# Patient Record
Sex: Female | Born: 1975 | Race: White | Hispanic: No | Smoking: Former smoker
Health system: Southern US, Community
[De-identification: ages and names within clinical notes are randomized; demographics above are authoritative.]

## PROBLEM LIST (undated history)

## (undated) DIAGNOSIS — F329 Major depressive disorder, single episode, unspecified: Secondary | ICD-10-CM

## (undated) DIAGNOSIS — Z9109 Other allergy status, other than to drugs and biological substances: Secondary | ICD-10-CM

## (undated) DIAGNOSIS — F429 Obsessive-compulsive disorder, unspecified: Secondary | ICD-10-CM

## (undated) DIAGNOSIS — R519 Headache, unspecified: Secondary | ICD-10-CM

## (undated) DIAGNOSIS — F419 Anxiety disorder, unspecified: Secondary | ICD-10-CM

## (undated) DIAGNOSIS — D649 Anemia, unspecified: Secondary | ICD-10-CM

## (undated) DIAGNOSIS — F909 Attention-deficit hyperactivity disorder, unspecified type: Secondary | ICD-10-CM

## (undated) DIAGNOSIS — N879 Dysplasia of cervix uteri, unspecified: Secondary | ICD-10-CM

## (undated) DIAGNOSIS — R112 Nausea with vomiting, unspecified: Secondary | ICD-10-CM

## (undated) DIAGNOSIS — Z8632 Personal history of gestational diabetes: Secondary | ICD-10-CM

## (undated) DIAGNOSIS — Z973 Presence of spectacles and contact lenses: Secondary | ICD-10-CM

## (undated) DIAGNOSIS — R7303 Prediabetes: Secondary | ICD-10-CM

## (undated) DIAGNOSIS — Z9889 Other specified postprocedural states: Secondary | ICD-10-CM

## (undated) DIAGNOSIS — F32A Depression, unspecified: Secondary | ICD-10-CM

## (undated) DIAGNOSIS — J302 Other seasonal allergic rhinitis: Secondary | ICD-10-CM

## (undated) DIAGNOSIS — R51 Headache: Secondary | ICD-10-CM

## (undated) HISTORY — DX: Personal history of gestational diabetes: Z86.32

## (undated) HISTORY — DX: Obsessive-compulsive disorder, unspecified: F42.9

## (undated) HISTORY — DX: Headache: R51

## (undated) HISTORY — PX: LEEP: SHX91

## (undated) HISTORY — DX: Depression, unspecified: F32.A

## (undated) HISTORY — DX: Headache, unspecified: R51.9

## (undated) HISTORY — DX: Major depressive disorder, single episode, unspecified: F32.9

## (undated) HISTORY — DX: Attention-deficit hyperactivity disorder, unspecified type: F90.9

## (undated) HISTORY — DX: Anxiety disorder, unspecified: F41.9

---

## 2001-10-28 HISTORY — PX: LAPAROSCOPIC CHOLECYSTECTOMY: SUR755

## 2004-10-28 HISTORY — PX: LEEP: SHX91

## 2004-10-28 HISTORY — PX: TUBAL LIGATION: SHX77

## 2014-12-27 ENCOUNTER — Ambulatory Visit (INDEPENDENT_AMBULATORY_CARE_PROVIDER_SITE_OTHER): Payer: BLUE CROSS/BLUE SHIELD | Admitting: Family Medicine

## 2014-12-27 VITALS — BP 108/60 | HR 118 | Temp 99.2°F | Resp 16 | Ht 64.0 in | Wt 204.0 lb

## 2014-12-27 DIAGNOSIS — R52 Pain, unspecified: Secondary | ICD-10-CM

## 2014-12-27 DIAGNOSIS — R509 Fever, unspecified: Secondary | ICD-10-CM | POA: Diagnosis not present

## 2014-12-27 DIAGNOSIS — J45909 Unspecified asthma, uncomplicated: Secondary | ICD-10-CM | POA: Diagnosis not present

## 2014-12-27 DIAGNOSIS — R519 Headache, unspecified: Secondary | ICD-10-CM

## 2014-12-27 DIAGNOSIS — R51 Headache: Secondary | ICD-10-CM

## 2014-12-27 DIAGNOSIS — J0100 Acute maxillary sinusitis, unspecified: Secondary | ICD-10-CM

## 2014-12-27 LAB — POCT INFLUENZA A/B
Influenza A, POC: NEGATIVE
Influenza B, POC: NEGATIVE

## 2014-12-27 LAB — POCT RAPID STREP A (OFFICE): Rapid Strep A Screen: NEGATIVE

## 2014-12-27 MED ORDER — HYDROCODONE-HOMATROPINE 5-1.5 MG/5ML PO SYRP
5.0000 mL | ORAL_SOLUTION | Freq: Three times a day (TID) | ORAL | Status: DC | PRN
Start: 2014-12-27 — End: 2015-03-23

## 2014-12-27 MED ORDER — ALBUTEROL SULFATE HFA 108 (90 BASE) MCG/ACT IN AERS: 2.0000 | INHALATION_SPRAY | Freq: Four times a day (QID) | RESPIRATORY_TRACT | Status: DC | PRN

## 2014-12-27 MED ORDER — AMOXICILLIN-POT CLAVULANATE 875-125 MG PO TABS: 1.0000 | ORAL_TABLET | Freq: Two times a day (BID) | ORAL | Status: DC

## 2014-12-27 NOTE — Patient Instructions (Signed)

## 2014-12-27 NOTE — Progress Notes (Signed)
Chief Complaint:  Chief Complaint  Patient presents with  . Illness    congestion, chills, myalgias, fever    HPI Barbara Barr is a 39 y.o. female who is here for sinus congestion, throat hurts, ears clog, hurt from head to toe since last night. She has had sick contact, both her children were sick and felt fine after 2 days. She states they were fine after 2 days. For the cough she took robitussion, multisymptom. No releif. Has taken also alkaselzter cold and flu. She feels crummy over all over. Everythime she tries to breath she feels like she is  "sucking chlorine in her nose"/  She has not been able to smoke. She feels like she has the flu and a truck . She is having facial pain that is mild to moderate.  Past Medical History  Diagnosis Date  . Sinusitis   . Gestational diabetes    Past Surgical History  Procedure Laterality Date  . Cholecystectomy     History   Social History  . Marital Status: Divorced    Spouse Name: N/A  . Number of Children: N/A  . Years of Education: N/A   Social History Main Topics  . Smoking status: Current Every Day Smoker -- 1.00 packs/day for 13 years    Types: Cigarettes  . Smokeless tobacco: Not on file  . Alcohol Use: No  . Drug Use: No  . Sexual Activity: Not on file   Other Topics Concern  . None   Social History Narrative   No family history on file. Allergies  Allergen Reactions  . Codeine Itching   Prior to Admission medications   Not on File     ROS: The patient denies , night sweats, unintentional weight loss, chest pain, palpitations, wheezing, dyspnea on exertion, nausea, vomiting, abdominal pain, dysuria, hematuria, melena, numbness, , or tingling.   All other systems have been reviewed and were otherwise negative with the exception of those mentioned in the HPI and as above.    PHYSICAL EXAM: Filed Vitals:   12/27/14 1317  BP: 108/60  Pulse: 118  Temp: 99.2 F (37.3 C)  Resp: 16   Filed Vitals:    12/27/14 1317  Height: 5\' 4"  (1.626 m)  Weight: 204 lb (92.534 kg)   Body mass index is 35 kg/(m^2).  General: Alert, no acute distress HEENT:  Normocephalic, atraumatic, oropharynx patent. EOMI, PERRLA Cardiovascular:  Regular rate and rhythm, no rubs murmurs or gallops.  No Carotid bruits, radial pulse intact. No pedal edema.  Respiratory: Clear to auscultation bilaterally.  No wheezes, rales, or rhonchi.  No cyanosis, no use of accessory musculature GI: No organomegaly, abdomen is soft and non-tender, positive bowel sounds.  No masses. Skin: No rashes. Neurologic: Facial musculature symmetric. Psychiatric: Patient is appropriate throughout our interaction. Lymphatic: No cervical lymphadenopathy Musculoskeletal: Gait intact.   LABS: Results for orders placed or performed in visit on 12/27/14  Culture, Group A Strep  Result Value Ref Range   Organism ID, Bacteria Normal Upper Respiratory Flora    Organism ID, Bacteria No Beta Hemolytic Streptococci Isolated   POCT Influenza A/B  Result Value Ref Range   Influenza A, POC Negative    Influenza B, POC Negative   POCT rapid strep A  Result Value Ref Range   Rapid Strep A Screen Negative Negative     EKG/XRAY:   Primary [facility]   ASSESSMENT/PLAN: Encounter Diagnoses  Name Primary?  .Marland Kitchen  Body aches   . Fever chills   . Facial pain   . Acute maxillary sinusitis, recurrence not specified Yes  . Asthma, chronic, unspecified asthma severity, uncomplicated    Patient given Augmentin, albuterol inhaler as needed for SOB, and Hycodan cough syrup. She was negative for strep or influenza in the office.  Gross sideeffects, risk and benefits, and alternatives of medications d/w patient. Patient is aware that all medications have potential sideeffects and we are unable to predict every sideeffect or drug-drug interaction that may occur.  Barbara Stach PHUONG, DO 12/30/2014 1:48 PM

## 2014-12-28 LAB — CULTURE, GROUP A STREP: Organism ID, Bacteria: NORMAL

## 2014-12-30 ENCOUNTER — Telehealth: Payer: Self-pay | Admitting: Family Medicine

## 2014-12-30 ENCOUNTER — Emergency Department (HOSPITAL_BASED_OUTPATIENT_CLINIC_OR_DEPARTMENT_OTHER)
Admission: EM | Admit: 2014-12-30 | Discharge: 2014-12-30 | Disposition: A | Discharge status: Home / Self Care | Payer: BLUE CROSS/BLUE SHIELD | Attending: Emergency Medicine | Admitting: Emergency Medicine

## 2014-12-30 ENCOUNTER — Emergency Department (HOSPITAL_BASED_OUTPATIENT_CLINIC_OR_DEPARTMENT_OTHER): Payer: BLUE CROSS/BLUE SHIELD

## 2014-12-30 ENCOUNTER — Encounter (HOSPITAL_BASED_OUTPATIENT_CLINIC_OR_DEPARTMENT_OTHER): Payer: Self-pay

## 2014-12-30 DIAGNOSIS — J111 Influenza due to unidentified influenza virus with other respiratory manifestations: Secondary | ICD-10-CM

## 2014-12-30 DIAGNOSIS — Z791 Long term (current) use of non-steroidal anti-inflammatories (NSAID): Secondary | ICD-10-CM | POA: Insufficient documentation

## 2014-12-30 DIAGNOSIS — Z720 Tobacco use: Secondary | ICD-10-CM | POA: Diagnosis not present

## 2014-12-30 DIAGNOSIS — R0789 Other chest pain: Secondary | ICD-10-CM | POA: Insufficient documentation

## 2014-12-30 DIAGNOSIS — Z8632 Personal history of gestational diabetes: Secondary | ICD-10-CM | POA: Diagnosis not present

## 2014-12-30 DIAGNOSIS — J069 Acute upper respiratory infection, unspecified: Secondary | ICD-10-CM | POA: Diagnosis not present

## 2014-12-30 DIAGNOSIS — R05 Cough: Secondary | ICD-10-CM | POA: Diagnosis present

## 2014-12-30 LAB — BASIC METABOLIC PANEL
ANION GAP: 5 (ref 5–15)
BUN: 8 mg/dL (ref 6–23)
CHLORIDE: 103 mmol/L (ref 96–112)
CO2: 25 mmol/L (ref 19–32)
Calcium: 8.3 mg/dL — ABNORMAL LOW (ref 8.4–10.5)
Creatinine, Ser: 0.68 mg/dL (ref 0.50–1.10)
GFR calc Af Amer: >90 mL/min (ref >90)
GFR calc non Af Amer: >90 mL/min (ref >90)
Glucose, Bld: 100 mg/dL — ABNORMAL HIGH (ref 70–99)
Potassium: 3.7 mmol/L (ref 3.5–5.1)
SODIUM: 133 mmol/L — AB (ref 135–145)

## 2014-12-30 LAB — CBC WITH DIFFERENTIAL/PLATELET
BASOS ABS: 0 10*3/uL (ref 0.0–0.1)
BASOS PCT: 0 % (ref 0–1)
Eosinophils Absolute: 0 10*3/uL (ref 0.0–0.7)
Eosinophils Relative: 0 % (ref 0–5)
HCT: 41.6 % (ref 36.0–46.0)
Hemoglobin: 13.7 g/dL (ref 12.0–15.0)
LYMPHS PCT: 17 % (ref 12–46)
Lymphs Abs: 1 10*3/uL (ref 0.7–4.0)
MCH: 28.1 pg (ref 26.0–34.0)
MCHC: 32.9 g/dL (ref 30.0–36.0)
MCV: 85.2 fL (ref 78.0–100.0)
Monocytes Absolute: 0.5 10*3/uL (ref 0.1–1.0)
Monocytes Relative: 8 % (ref 3–12)
NEUTROS ABS: 4.5 10*3/uL (ref 1.7–7.7)
NEUTROS PCT: 75 % (ref 43–77)
PLATELETS: 210 10*3/uL (ref 150–400)
RBC: 4.88 MIL/uL (ref 3.87–5.11)
RDW: 13.3 % (ref 11.5–15.5)
WBC: 6 10*3/uL (ref 4.0–10.5)

## 2014-12-30 LAB — TROPONIN I: Troponin I: <0.03 ng/mL (ref <0.031)

## 2014-12-30 MED ORDER — ACETAMINOPHEN 500 MG PO TABS
1000.0000 mg | ORAL_TABLET | Freq: Once | ORAL | Status: AC
  Administered 2014-12-30: 1000 mg via ORAL
  Filled 2014-12-30: qty 2

## 2014-12-30 MED ORDER — ALBUTEROL SULFATE (2.5 MG/3ML) 0.083% IN NEBU
5.0000 mg | INHALATION_SOLUTION | Freq: Once | RESPIRATORY_TRACT | Status: AC
  Administered 2014-12-30: 5 mg via RESPIRATORY_TRACT
  Filled 2014-12-30: qty 6

## 2014-12-30 MED ORDER — SODIUM CHLORIDE 0.9 % IV BOLUS (SEPSIS)
1000.0000 mL | Freq: Once | INTRAVENOUS | Status: AC
  Administered 2014-12-30: 1000 mL via INTRAVENOUS

## 2014-12-30 NOTE — ED Notes (Signed)
Developed cough and fever 4 days ago, seen at Urgent Care.  Negative Flu, started on Augmentin, continues to cough and temp of 102.6.

## 2014-12-30 NOTE — ED Provider Notes (Signed)
CSN: [id number]     Arrival date & time 12/30/14  [id number] History   First MD Initiated Contact with Patient 12/30/14 1159     Chief Complaint  Patient presents with  . Cough     (Consider location/radiation/quality/duration/timing/severity/associated sxs/prior Treatment) HPI Barbara Barr is a 39 year old female with no significant past medical history who presents the ER complaining of cough and fever. Patient states her symptoms began gradually 4 days ago, and she was seen at an urgent care for same. Patient states she was placed on Augmentin, given an albuterol inhaler, and given Hycodan cough syrup for symptomatically relief. Patient states she had a negative flu and negative strep test at that time. Patient reports no change in her symptoms in the past several days. Patient states she is now experiencing a nonproductive cough, with associated chest discomfort when she coughs and takes a deep breath, sore throat, headache with coughing, subjective fever of 102F at home. Patient denies respiratory distress, wheezing, nausea, vomiting, abdominal pain, blurred vision, no dizziness, weakness.  Past Medical History  Diagnosis Date  . Sinusitis   . Gestational diabetes    Past Surgical History  Procedure Laterality Date  . Cholecystectomy     No family history on file. History  Substance Use Topics  . Smoking status: Current Every Day Smoker -- 1.00 packs/day for 13 years    Types: Cigarettes  . Smokeless tobacco: Not on file  . Alcohol Use: No   OB History    No data available     Review of Systems  Constitutional: Negative for fever.  HENT: Positive for congestion and sore throat. Negative for trouble swallowing.   Eyes: Negative for visual disturbance.  Respiratory: Positive for cough and chest tightness. Negative for shortness of breath and wheezing.   Cardiovascular: Negative for chest pain.  Gastrointestinal: Negative for nausea, vomiting and abdominal pain.    Genitourinary: Negative for dysuria.  Musculoskeletal: Negative for neck pain.       Chest discomfort  Skin: Negative for rash.  Neurological: Negative for dizziness, weakness and numbness.  Psychiatric/Behavioral: Negative.       Allergies  Codeine  Home Medications   Prior to Admission medications   Medication Sig Start Date End Date Taking? Authorizing Provider  albuterol (PROVENTIL HFA;VENTOLIN HFA) 108 (90 BASE) MCG/ACT inhaler Inhale 2 puffs into the lungs every 6 (six) hours as needed for wheezing or shortness of breath. 12/27/14   Barbara P Le, DO  amoxicillin-clavulanate (AUGMENTIN) 875-125 MG per tablet Take 1 tablet by mouth 2 (two) times daily. 12/27/14   Barbara P Le, DO  HYDROcodone-homatropine (HYCODAN) 5-1.5 MG/5ML syrup Take 5 mLs by mouth every 8 (eight) hours as needed. 12/27/14   Barbara P Le, DO   BP 120/61 mmHg  Pulse 95  Temp(Src) 99.7 F (37.6 C) (Oral)  Resp 16  Ht 5\' 4"  (1.626 m)  Wt 205 lb (92.987 kg)  BMI 35.17 kg/m2  SpO2 99%  LMP 12/12/2014 (Exact Date) Physical Exam  Constitutional: She is oriented to person, place, and time. She appears well-developed and well-nourished. No distress.  HENT:  Head: Normocephalic and atraumatic.  Mouth/Throat: Oropharynx is clear and moist. No oropharyngeal exudate.  Eyes: Right eye exhibits no discharge. Left eye exhibits no discharge. No scleral icterus.  Neck: Normal range of motion.  Cardiovascular: Normal rate, regular rhythm and normal heart sounds.   No murmur heard. Pulmonary/Chest: Effort normal and breath sounds normal. No accessory muscle usage. No tachypnea. No respiratory distress.  She has no decreased breath sounds. She has no wheezes. She has no rhonchi.    Abdominal: Soft. There is no tenderness.  Musculoskeletal: Normal range of motion. She exhibits no edema or tenderness.  Neurological: She is alert and oriented to person, place, and time. No cranial nerve deficit. Coordination normal.  Skin: Skin is  warm and dry. No rash noted. She is not diaphoretic.  Psychiatric: She has a normal mood and affect.  Nursing note and vitals reviewed.   ED Course  Procedures (including critical care time) Labs Review Labs Reviewed  BASIC METABOLIC PANEL - Abnormal; Notable for the following:    Sodium 133 (*)    Glucose, Bld 100 (*)    Calcium 8.3 (*)    All other components within normal limits  TROPONIN I  CBC WITH DIFFERENTIAL/PLATELET    Imaging Review Dg Chest 2 View  12/30/2014   CLINICAL DATA:  Cough and fever for 4 days.  EXAM: CHEST  2 VIEW  COMPARISON:  None.  FINDINGS: The heart size and mediastinal contours are within normal limits. Both lungs are clear. No pneumothorax or pleural effusion is noted. The visualized skeletal structures are unremarkable.  IMPRESSION: No acute cardiopulmonary abnormality seen.   Electronically Signed   By: Lupita Raider, M.D.   On: 12/30/2014 10:47     EKG Interpretation   Date/Time:  Friday December 30 2014 12:42:24 EST Ventricular Rate:  96 PR Interval:  158 QRS Duration: 76 QT Interval:  346 QTC Calculation: 437 R Axis:   65 Text Interpretation:  Normal sinus rhythm Normal ECG No old tracing to  compare Confirmed by KNAPP  MD-J, JON ([id number]) on 12/30/2014 12:51:05 PM      MDM   Final diagnoses:  URI (upper respiratory infection)  Influenza    4 days of worsening cough and fever, pleuritic chest pain with coughing, sore throat.  Neg flu/strep at UC x3 days ago.  Patient initially tachycardic at 120 on exam, patient admittedly having very poor by mouth intake over the past several days including very little liquid intake. Patient's tachycardia resolved after IV fluids, likely tachycardia due to dehydration. Patient reporting symptoms mildly subsiding after albuterol nebulizer, Tylenol and fluids in the ER. Chest pain pleuritic in nature, tender to touch and only present when patient coughing. No concern for ACS. No concern for PE. Labs reassuring.  Chest radiographs unremarkable for acute pathology. No concern for pneumonia at this point. Patient nontoxic tachycardic, nontachypneic, afebrile, non-hypoxic, well-appearing and in no acute distress, we'll discharge patient at this time. Patient meets clinical picture of influenza despite negative rapid flu earlier in the week. Discussed return precautions with patient, recommended symptomatic therapy, recommended patient follow-up with a primary care provider and patient verbalizes understanding and agreement of this plan. I encouraged patient to call or return to ER for any worsening of symptoms or should she have any questions or concerns.  BP 120/61 mmHg  Pulse 95  Temp(Src) 99.7 F (37.6 C) (Oral)  Resp 16  Ht  (1.626 m)  Wt 205 lb (92.987 kg)  BMI 35.17 kg/m2  SpO2 99%  LMP 12/12/2014 (Exact Date)  Signed,  Barbara Mow, PA-C 12:25 AM  Patient discussed with Dr. Linwood Dibbles, MD  Barbara Fantasia, PA-C 12/31/14 0025  Barbara Dibbles, MD 01/02/15 [phone]

## 2014-12-30 NOTE — Telephone Encounter (Signed)
Pt called back again this morning. Advised to f/u here or at the ER. Pt agreeable.

## 2014-12-30 NOTE — Discharge Instructions (Signed)
Follow-up with primary care provider, refer to resource guide below. Return to the ER with any severe shortness of breath, severe chest pain, worsening of symptoms.   Influenza Influenza ("the flu") is a viral infection of the respiratory tract. It occurs more often in winter months because people spend more time in close contact with one another. Influenza can make you feel very sick. Influenza easily spreads from person to person (contagious). CAUSES  Influenza is caused by a virus that infects the respiratory tract. You can catch the virus by breathing in droplets from an infected person's cough or sneeze. You can also catch the virus by touching something that was recently contaminated with the virus and then touching your mouth, nose, or eyes. RISKS AND COMPLICATIONS You may be at risk for a more severe case of influenza if you smoke cigarettes, have diabetes, have chronic heart disease (such as heart failure) or lung disease (such as asthma), or if you have a weakened immune system. Elderly people and pregnant women are also at risk for more serious infections. The most common problem of influenza is a lung infection (pneumonia). Sometimes, this problem can require emergency medical care and may be life threatening. SIGNS AND SYMPTOMS  Symptoms typically last 4 to 10 days and may include:  Fever.  Chills.  Headache, body aches, and muscle aches.  Sore throat.  Chest discomfort and cough.  Poor appetite.  Weakness or feeling tired.  Dizziness.  Nausea or vomiting. DIAGNOSIS  Diagnosis of influenza is often made based on your history and a physical exam. A nose or throat swab test can be done to confirm the diagnosis. TREATMENT  In mild cases, influenza goes away on its own. Treatment is directed at relieving symptoms. For more severe cases, your health care provider may prescribe antiviral medicines to shorten the sickness. Antibiotic medicines are not effective because the  infection is caused by a virus, not by bacteria. HOME CARE INSTRUCTIONS  Take medicines only as directed by your health care provider.  Use a cool mist humidifier to make breathing easier.  Get plenty of rest until your temperature returns to normal. This usually takes 3 to 4 days.  Drink enough fluid to keep your urine clear or pale yellow.  Cover yourmouth and nosewhen coughing or sneezing,and wash your handswellto prevent thevirusfrom spreading.  Stay homefromwork orschool untilthe fever is gonefor at least 581full day. PREVENTION  An annual influenza vaccination (flu shot) is the best way to avoid getting influenza. An annual flu shot is now routinely recommended for all adults in the [place] SEEK MEDICAL CARE IF:  You experiencechest pain, yourcough worsens,or you producemore mucus.  Youhave nausea,vomiting, ordiarrhea.  Your fever returns or gets worse. SEEK IMMEDIATE MEDICAL CARE IF:  You havetrouble breathing, you become short of breath,or your skin ornails becomebluish.  You have severe painor stiffnessin the neck.  You develop a sudden headache, or pain in the face or ear.  You have nausea or vomiting that you cannot control. MAKE SURE YOU:   Understand these instructions.  Will watch your condition.  Will get help right away if you are not doing well or get worse. Document Released: 10/11/2000 Document Revised: 02/28/2014 Document Reviewed: 01/13/2012 [facility] Patient Information 2015 [place], [place]. This information is not intended to replace advice given to you by your health care provider. Make sure you discuss any questions you have with your health care provider.   Emergency Department Resource Guide 1) Find a Doctor and Pay Out  of Pocket Although you won't have to find out who is covered by your insurance plan, it is a good idea to ask around and get recommendations. You will then need to call the office and see if the doctor you  have chosen will accept you as a new patient and what types of options they offer for patients who are self-pay. Some doctors offer discounts or will set up payment plans for their patients who do not have insurance, but you will need to ask so you aren't surprised when you get to your appointment.  2) Contact Your Local Health Department Not all health departments have doctors that can see patients for sick visits, but many do, so it is worth a call to see if yours does. If you don't know where your local health department is, you can check in your phone book. The CDC also has a tool to help you locate your state's health department, and many state websites also have listings of all of their local health departments.  3) Find a Walk-in Clinic If your illness is not likely to be very severe or complicated, you may want to try a walk in clinic. These are popping up all over the country in pharmacies, drugstores, and shopping centers. They're usually staffed by nurse practitioners or physician assistants that have been trained to treat common illnesses and complaints. They're usually fairly quick and inexpensive. However, if you have serious medical issues or chronic medical problems, these are probably not your best option.  No Primary Care Doctor: - Call Health Connect at  [phone] - they can help you locate a primary care doctor that  accepts your insurance, provides certain services, etc. - Physician Referral Service- [phone]  Chronic Pain Problems: Organization         Address  Phone   Notes  [facility]  [phone] Patients need to be referred by their primary care doctor.   Medication Assistance: Organization         Address  Phone   Notes  [facility] [address] [place], [place] [address] [phone] --Must be a resident of [facility] -- Must have NO insurance coverage whatsoever (no Medicaid/ Medicare,  etc.) -- The pt. MUST have a primary care doctor that directs their care regularly and follows them in the community   [facility]  [phone]   [employer]  [phone]    Agencies that provide inexpensive medical care: Organization         Address  Phone   Notes  [facility]  [phone]   [facility]    [phone]   [facility] [address] [place], [place] [address] [phone]   [facility] [address], [place] [phone]   [facility]anned Parenthood    [phone]   [facility]    [phone]   [facility]  [address], [place] Phone:  [phone], Fax:  [phone] Hours of Operation:  9 am - 6 pm, M-F.  Also accepts Medicaid/Medicare and self-pay.  [facility]  [address], [place] Phone: [phone], Fax: [phone]. Hours of Operation:  8:30 am - 5:30 pm, M-F.  Also accepts Medicaid and self-pay.  [facility] [place] [address] [employer] Phone: [phone]   [facility] [address] Natasha Bence [place], [place] [phone], Ext. [phone] Mondays & Thursdays: 7-9 AM.  First 15 patients are seen on a first come, first serve basis.    Medicaid-accepting [facility] Providers:  Organization         Address  Phone   Notes  [facility] [address], [place] [phone] Also accepts self-pay patients.  [facility] [address] Laurell Josephs [place], [place]  [phone]   [facility] [address], [place] [phone]   [facility] [address] [place] [phone]   Renaye Rakers [address], [place]   [phone] Only accepts [place] patients after they have their name applied to their card.   Self-Pay (no  insurance) in [facility]:  Organization         Address  Phone   Notes  Sickle Cell Patients, [facility] [address] [place], [place] [phone]   [facility] [address] [place], [place] [phone]   [facility]  [address], [place] [phone]   [facility]/Dr. Osei-Bonsu  [address], [place] or [address], [place] [phone] Phone number for both [place] and [place] locations is the same.  [facility] [address] [place] [phone]   [facility] [address] [place] or [address] [phone]   [facility] [address], [place] [phone], phone; [phone], fax Sees patients 1st and 3rd Saturday of every month.  Must not qualify for public or private insurance (i.e. Medicaid, Medicare, [place] Health Choice, Veterans' Benefits)  Household income should be no more than 200% of the poverty level The clinic cannot treat you if you are pregnant or think you are pregnant  Sexually transmitted diseases are not treated at the clinic.    Dental Care: Organization         Address  Phone  Notes  [facility] [address] [place], [place] [phone] Accepts children up to age 29 who are enrolled in [place] or [place] Health Choice; pregnant women with a Medicaid card; and children who have applied for Medicaid or [place] Health Choice, but were declined, whose parents can pay a reduced fee at time of service.  [facility]  [address], [place] [phone] Accepts children up to age 32 who are enrolled in [place] or [place] Health Choice; pregnant women with a Medicaid card; and children who have applied for Medicaid or  Health Choice, but were  declined, whose parents can pay a reduced fee at time of service.  [facility]  [address] [place], [place] [phone] Patients are seen by appointment only. Walk-ins are not accepted. [facility] will see patients 68 years of age and older. Monday - Tuesday (8am-5pm) Most Wednesdays (8:30-5pm) $30 per visit, cash only  [facility] Access [facility]  [address], [facility] [phone] Patients are seen by appointment only. Walk-ins are not accepted. [facility] will see patients 5 years of age and older. One Wednesday Evening (Monthly: Volunteer Based).  $30 per visit, cash  only  [employer] [facility] Clinics  [phone] for adults; Children under age 25, call Graduate Pediatric Dentistry at [phone]. Children aged 40-14, please call [phone] to request a pediatric application.  Dental services are provided in all areas of dental care including fillings, crowns and bridges, complete and partial dentures, implants, gum treatment, root canals, and extractions. Preventive care is also provided. Treatment is provided to both adults and children. Patients are selected via a lottery and there is often a waiting list.   [facility] [address], [place]  [phone] [email or web address]   [facility] [address] [place], [place] [phone], Ext. [phone] Second and Fourth Thursday of each month, opens at 6:30 AM; Clinic ends at 9 AM.  Patients are seen on a first-come first-served basis, and a limited number are seen during each clinic.   [facility]  [address] Ether Griffins [place], [place] [phone]   Eligibility Requirements You must have lived in [place], [place], or [place] counties for at least the last three months.   You cannot be eligible for state or federal sponsored [place], including [employer], [place], or [employer].   You generally cannot be  eligible for healthcare insurance through your employer.    How to apply: Eligibility screenings are held every Tuesday and Wednesday afternoon from 1:00 pm until 4:00 pm. You do not need an appointment for the interview!  [facility] [address], [place], [place] [phone]   [facility]  [phone]   [facility] Health [facility]  [phone]   [facility]  [phone]    Behavioral Health Resources in the Community: Intensive Outpatient Programs Organization         Address  Phone  Notes  [facility] [address], [place], [place] [phone]   [facility] [address] [place], [place] [phone]   [facility] [address] [place], [place]  [phone]   [facility] [address],  [place], [place] [phone] or [phone]   Substance Abuse Resources Organization         Address  Phone  Notes  [facility]  [phone]   [facility]  [phone]   [facility] [place]  [phone]   Floydene Flock  [phone]   Residential & Outpatient Substance Abuse Program  [phone]   Psychological Services Organization         Address  Phone  Notes  [facility]  [phone]   [facility]  [phone]   [facility] [address], [place] [phone] or [phone]    Mobile Crisis Teams Organization         Address  Phone  Notes  [facility], Mobile Crisis Care Unit  [phone]   [facility]  [address] [place], [place] [phone]   Doristine Locks [address] [place] [phone]    Self-Help/Support Groups Organization         Address  Phone             Notes  [facility] [place] - variety of support groups  [phone]- I7437963 Call for more information    Narcotics Anonymous (NA), [facility] [address], [employer] [place]  2 meetings at this location   Statistician  Address  Phone  Notes  °[facility] [address],    °[place]  [phone]   °[facility] ° [address], [place], [place] [phone]   °[facility] [address], [place] [phone] Admissions: 8am-3pm M-F  °[facility] [address]    °[place], [place] [phone]   °[facility] [address], [place], [place] [phone]   °[facility] [address]  °[place], [place] [phone]   °[facility] - Intensive Outpatient [address], [place], [place] [phone]   °[facility] [address]  °[place], [place] [phone] or [phone]   °[facility] ([facility]) [address] [place], [place] [phone] Accepts Medicaid  °[facility] [address]  °[place] 1-800-659-3381 Substance Abuse/Addiction Treatment  ° °[place] Behavior[facility] Resources °Organization         Address  Phone  Notes  °[facility]  ([phone]) [phone]   °Julie Brannon, PhD [address] [place], [place]   ([phone]) [phone] or (336) [phone]   °[facility]   [address] °[place], [place] ([phone]) [phone]   °[facility] [address], [place], [place] ([phone]) [phone] Insurance/Medicaid/sponsorship through [facility]  °[facility] [address]                                    [place], [place] [phone]) [phone] Therapy/tele-psych/case  °[facility] [address]  ° [place], [place] ([phone]) [phone]    °Dr. Arfeen  ([phone]) [phone]   °[facility] [place]  [employer] [place] [facility] 1) [address], [place] °2) [address], [place] °3)  [address], [place] ([phone]) [phone] °(336)  [phone] ° °(336) [phone]   °[place] Child Abuse Hotline (336) 342-1394 or (336) [phone] (After Hours)    ° ° ° °

## 2014-12-30 NOTE — Telephone Encounter (Signed)
The answering service called with a message from the patient. Patient states that she is not feeling any better. States that she is having painful breathing and nose bleeds. Patient needs to RTC for this. Please advise. Also note that I have marked this a high priority message due to the fact that patient is having breathing issues and nose bleeds (severity of nose bleeds is unknown).   [phone]

## 2015-03-23 ENCOUNTER — Ambulatory Visit (INDEPENDENT_AMBULATORY_CARE_PROVIDER_SITE_OTHER): Payer: BLUE CROSS/BLUE SHIELD | Admitting: Psychiatry

## 2015-03-23 ENCOUNTER — Encounter (HOSPITAL_COMMUNITY): Payer: Self-pay

## 2015-03-23 ENCOUNTER — Encounter (HOSPITAL_COMMUNITY): Payer: Self-pay | Admitting: Psychiatry

## 2015-03-23 VITALS — BP 130/74 | HR 105 | Ht 65.0 in | Wt 198.0 lb

## 2015-03-23 DIAGNOSIS — F32A Depression, unspecified: Secondary | ICD-10-CM

## 2015-03-23 DIAGNOSIS — F329 Major depressive disorder, single episode, unspecified: Secondary | ICD-10-CM

## 2015-03-23 DIAGNOSIS — F331 Major depressive disorder, recurrent, moderate: Secondary | ICD-10-CM

## 2015-03-23 DIAGNOSIS — R5383 Other fatigue: Secondary | ICD-10-CM

## 2015-03-23 DIAGNOSIS — F909 Attention-deficit hyperactivity disorder, unspecified type: Secondary | ICD-10-CM

## 2015-03-23 MED ORDER — BUPROPION HCL 100 MG PO TABS: 200.0000 mg | ORAL_TABLET | Freq: Every day | ORAL | Status: DC
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
## 2015-04-13 ENCOUNTER — Ambulatory Visit (INDEPENDENT_AMBULATORY_CARE_PROVIDER_SITE_OTHER): Payer: BLUE CROSS/BLUE SHIELD | Admitting: Psychiatry

## 2015-04-13 ENCOUNTER — Encounter (HOSPITAL_COMMUNITY): Payer: Self-pay | Admitting: Psychiatry

## 2015-04-13 VITALS — BP 108/62 | HR 100 | Ht 65.0 in | Wt 200.0 lb

## 2015-04-13 DIAGNOSIS — F909 Attention-deficit hyperactivity disorder, unspecified type: Secondary | ICD-10-CM | POA: Diagnosis not present

## 2015-04-13 DIAGNOSIS — R5383 Other fatigue: Secondary | ICD-10-CM | POA: Diagnosis not present

## 2015-04-13 DIAGNOSIS — F32A Depression, unspecified: Secondary | ICD-10-CM

## 2015-04-13 DIAGNOSIS — F329 Major depressive disorder, single episode, unspecified: Secondary | ICD-10-CM

## 2015-04-13 MED ORDER — AMPHETAMINE-DEXTROAMPHETAMINE 10 MG PO TABS: ORAL_TABLET | ORAL | Status: DC
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
## 2015-05-10 ENCOUNTER — Telehealth (HOSPITAL_COMMUNITY): Payer: Self-pay

## 2015-05-10 MED ORDER — AMPHETAMINE-DEXTROAMPHETAMINE 10 MG PO TABS: ORAL_TABLET | ORAL | Status: DC

## 2015-05-10 NOTE — Telephone Encounter (Signed)
PT needs a script for Adderall / Will p/u after 1pm 05/11/15

## 2015-05-10 NOTE — Telephone Encounter (Signed)
adderall printed for pick up 

## 2015-05-12 ENCOUNTER — Ambulatory Visit (HOSPITAL_COMMUNITY): Payer: Self-pay | Admitting: Psychiatry

## 2015-06-05 ENCOUNTER — Encounter (HOSPITAL_BASED_OUTPATIENT_CLINIC_OR_DEPARTMENT_OTHER): Payer: Self-pay

## 2015-06-05 ENCOUNTER — Emergency Department (HOSPITAL_BASED_OUTPATIENT_CLINIC_OR_DEPARTMENT_OTHER)
Admission: EM | Admit: 2015-06-05 | Discharge: 2015-06-05 | Disposition: A | Discharge status: Home / Self Care | Payer: BLUE CROSS/BLUE SHIELD | Attending: Emergency Medicine | Admitting: Emergency Medicine

## 2015-06-05 ENCOUNTER — Emergency Department (HOSPITAL_BASED_OUTPATIENT_CLINIC_OR_DEPARTMENT_OTHER): Payer: BLUE CROSS/BLUE SHIELD

## 2015-06-05 DIAGNOSIS — Z8709 Personal history of other diseases of the respiratory system: Secondary | ICD-10-CM | POA: Insufficient documentation

## 2015-06-05 DIAGNOSIS — Z720 Tobacco use: Secondary | ICD-10-CM | POA: Diagnosis not present

## 2015-06-05 DIAGNOSIS — Z8632 Personal history of gestational diabetes: Secondary | ICD-10-CM | POA: Insufficient documentation

## 2015-06-05 DIAGNOSIS — Y998 Other external cause status: Secondary | ICD-10-CM | POA: Diagnosis not present

## 2015-06-05 DIAGNOSIS — S3992XA Unspecified injury of lower back, initial encounter: Secondary | ICD-10-CM | POA: Diagnosis not present

## 2015-06-05 DIAGNOSIS — M545 Low back pain, unspecified: Secondary | ICD-10-CM

## 2015-06-05 DIAGNOSIS — Z3202 Encounter for pregnancy test, result negative: Secondary | ICD-10-CM | POA: Insufficient documentation

## 2015-06-05 DIAGNOSIS — F909 Attention-deficit hyperactivity disorder, unspecified type: Secondary | ICD-10-CM | POA: Insufficient documentation

## 2015-06-05 DIAGNOSIS — Y9289 Other specified places as the place of occurrence of the external cause: Secondary | ICD-10-CM | POA: Diagnosis not present

## 2015-06-05 DIAGNOSIS — Y9389 Activity, other specified: Secondary | ICD-10-CM | POA: Insufficient documentation

## 2015-06-05 DIAGNOSIS — W109XXA Fall (on) (from) unspecified stairs and steps, initial encounter: Secondary | ICD-10-CM | POA: Insufficient documentation

## 2015-06-05 LAB — PREGNANCY, URINE: PREG TEST UR: NEGATIVE

## 2015-06-05 MED ORDER — METHOCARBAMOL 500 MG PO TABS: 500.0000 mg | ORAL_TABLET | Freq: Two times a day (BID) | ORAL | Status: DC

## 2015-06-05 MED ORDER — NAPROXEN 500 MG PO TABS: 500.0000 mg | ORAL_TABLET | Freq: Two times a day (BID) | ORAL | Status: DC

## 2015-06-05 NOTE — Discharge Instructions (Signed)
Back Exercises Return for any bowel or bladder incontinence or lower extremity weakness.  Back exercises help treat and prevent back injuries. The goal of back exercises is to increase the strength of your abdominal and back muscles and the flexibility of your back. These exercises should be started when you no longer have back pain. Back exercises include:  Pelvic Tilt. Lie on your back with your knees bent. Tilt your pelvis until the lower part of your back is against the floor. Hold this position 5 to 10 sec and repeat 5 to 10 times.  Knee to Chest. Pull first 1 knee up against your chest and hold for 20 to 30 seconds, repeat this with the other knee, and then both knees. This may be done with the other leg straight or bent, whichever feels better.  Sit-Ups or Curl-Ups. Bend your knees 90 degrees. Start with tilting your pelvis, and do a partial, slow sit-up, lifting your trunk only 30 to 45 degrees off the floor. Take at least 2 to 3 seconds for each sit-up. Do not do sit-ups with your knees out straight. If partial sit-ups are difficult, simply do the above but with only tightening your abdominal muscles and holding it as directed.  Hip-Lift. Lie on your back with your knees flexed 90 degrees. Push down with your feet and shoulders as you raise your hips a couple inches off the floor; hold for 10 seconds, repeat 5 to 10 times.  Back arches. Lie on your stomach, propping yourself up on bent elbows. Slowly press on your hands, causing an arch in your low back. Repeat 3 to 5 times. Any initial stiffness and discomfort should lessen with repetition over time.  Shoulder-Lifts. Lie face down with arms beside your body. Keep hips and torso pressed to floor as you slowly lift your head and shoulders off the floor. Do not overdo your exercises, especially in the beginning. Exercises may cause you some mild back discomfort which lasts for a few minutes; however, if the pain is more severe, or lasts for  more than 15 minutes, do not continue exercises until you see your caregiver. Improvement with exercise therapy for back problems is slow.  See your caregivers for assistance with developing a proper back exercise program. Document Released: 11/21/2004 Document Revised: 01/06/2012 Document Reviewed: 08/15/2011 [facility] Patient Information 2015 [place], [place]. This information is not intended to replace advice given to you by your health care provider. Make sure you discuss any questions you have with your health care provider.  Emergency Department Resource Guide 1) Find a Doctor and Pay Out of Pocket Although you won't have to find out who is covered by your insurance plan, it is a good idea to ask around and get recommendations. You will then need to call the office and see if the doctor you have chosen will accept you as a new patient and what types of options they offer for patients who are self-pay. Some doctors offer discounts or will set up payment plans for their patients who do not have insurance, but you will need to ask so you aren't surprised when you get to your appointment.  2) Contact Your Local Health Department Not all health departments have doctors that can see patients for sick visits, but many do, so it is worth a call to see if yours does. If you don't know where your local health department is, you can check in your phone book. The CDC also has a tool to help you  locate your state's health department, and many state websites also have listings of all of their local health departments.  3) Find a Walk-in Clinic If your illness is not likely to be very severe or complicated, you may want to try a walk in clinic. These are popping up all over the country in pharmacies, drugstores, and shopping centers. They're usually staffed by nurse practitioners or physician assistants that have been trained to treat common illnesses and complaints. They're usually fairly quick and inexpensive.  However, if you have serious medical issues or chronic medical problems, these are probably not your best option.  No Primary Care Doctor: - Call Health Connect at  [phone] - they can help you locate a primary care doctor that  accepts your insurance, provides certain services, etc. - Physician Referral Service- [phone]  Chronic Pain Problems: Organization         Address  Phone   Notes  [facility]  [phone] Patients need to be referred by their primary care doctor.   Medication Assistance: Organization         Address  Phone   Notes  [facility] Medication [facility] [address] [place], [place] [address] [phone] --Must be a resident of [facility] -- Must have NO insurance coverage whatsoever (no Medicaid/ Medicare, etc.) -- The pt. MUST have a primary care doctor that directs their care regularly and follows them in the community   [facility]  [phone]   [employer]  [phone]    Agencies that provide inexpensive medical care: Organization         Address  Phone   Notes  [facility]  [phone]   [facility]    [phone]   [facility] [address] [place], [place] [address] [phone]   [facility] [address], [place] [phone]   [facility]    [phone]   [facility]    [phone]   [facility]  [address], [place] Phone:  [phone], Fax:  [phone] Hours of Operation:  9 am - 6 pm, M-F.  Also accepts Medicaid/Medicare and self-pay.  [facility]  [address], [place] Phone: [phone], Fax: [phone]. Hours of Operation:  8:30 am - 5:30 pm, M-F.  Also accepts Medicaid and self-pay.  [facility] [place] [address], [place] Phone: [phone]   [facility] [address] Natasha Bence [place], [place] [phone], Ext. [phone] Mondays & Thursdays: 7-9 AM.  First 15 patients are seen on a first come, first serve basis.    Medicaid-accepting [facility] Providers:  Organization         Address  Phone   Notes  [facility] [address], [place] [phone] Also accepts self-pay patients.  [facility] [address] Laurell Josephs [place], [place]  [phone]   [facility] [address], [place] [phone]   [facility] [address] [place] [phone]   Renaye Rakers [address], [place]   [phone] Only accepts [place] patients after they have their name applied to their card.   Self-Pay (no insurance) in [facility]:  Organization  Address  Phone   Notes  °Sickle Cell Patients, [facility] [address], [place] [phone]   °[facility] [address], [place] ([phone]) [phone]   °[facility] ° [address], [place] [phone]) [phone]   °[facility]/Dr. Osei-Bonsu ° [address] [place] [address], [place] or [address], [place] ([phone]) [phone] Phone number for both [place] and [place] locations is the same.  °[facility] [address], [place] ([phone]) [phone]   °[facility] [address] [place] [address], [place] or [address] (336) [phone] °(336) [phone]   °[facility] [address], [place] ([phone]) [phone], phone; [phone], fax Sees patients 1st and 3rd Saturday of every month.  Must not qualify for public or private insurance (i.e. Medicaid, Medicare, [place] Health Choice, Veterans' Benefits) • Household income should be no more than 200% of the poverty level •The clinic cannot treat you if you are pregnant or think  you are pregnant • Sexually transmitted diseases are not treated at the clinic.  ° ° °Dental Care: °Organization         Address  Phone  Notes  °[facility] [address], [place] ([phone]) [phone] Accepts children up to age 21 who are enrolled in Medicaid or [place] Health Choice; pregnant women with a Medicaid card; and children who have applied for Medicaid or [place] Health Choice, but were declined, whose parents can pay a reduced fee at time of service.  °[facility] [place]  [address], [place] (336) [phone] Accepts children up to age 21 who are enrolled in Medicaid or [place] Health Choice; pregnant women with a Medicaid card; and children who have applied for Medicaid or [place] Health Choice, but were declined, whose parents can pay a reduced fee at time of service.  °[facility] ° [address], [place] ([phone]) [phone] Patients are seen by appointment only. Walk-ins are not accepted. [facility] will see patients 18 years of age and older. °Monday - Tuesday (8am-5pm) °Most Wednesdays (8:30-5pm) °$30 per visit, cash only  °[facility] ° [address], [place] ([phone]) [phone] Patients are seen by appointment only. Walk-ins are not accepted. [facility] will see patients 18 years of age and older. °One Wednesday Evening (Monthly: Volunteer Based).  $30 per visit, cash only  °[facility]  ([phone]) [phone] for adults; Children under age 4, call Graduate Pediatric Dentistry at [phone]. Children aged 4-14, please call [phone] to request a pediatric application. ° Dental services are provided in all areas of dental care including fillings, crowns and bridges, complete and partial dentures, implants, gum treatment, root canals, and extractions. Preventive care is also provided. Treatment is provided to both adults and  children. °Patients are selected via a lottery and there is often a waiting list. °  °[facility] [address], °[place] ° (336) 763-8833 [email or web address] °  °[facility] [address], [place], [place] (336)723-1848, Ext. 123 Second and Fourth Thursday of each month, opens at 6:30 AM; Clinic ends at 9 AM.  Patients are seen on a first-come first-served basis, and a limited number are seen during each clinic.  ° °[facility] ° [address], [place], [place] ([phone]) [phone]   Eligibility Requirements °You must have lived in [place], [place], or [place] counties for at least the last three months. °  You cannot be eligible for state or federal sponsored healthcare insurance, including Veterans Administration, Medicaid, or Medicare. °  You generally cannot be eligible for healthcare insurance through your employer.  °  How to apply: °Eligibility screenings are held every Tuesday and Wednesday afternoon from 1:00 pm until 4:00 pm. You do not need an appointment for the interview!  °[facility] [address], [place], [place] [phone]   °[facility]  336-342-8273   °[facility]  336-703-3100   °[facility]  336-570-6415   ° °Behavioral Health Resources in the Community: °Intensive Outpatient Programs °Organization         Address  Phone  Notes  °[facility] [address], [place], [place] 336-878-6098   °[facility] [address], [place], [place] 336-832-9800   °[facility] [address], [place], [place] ° 336-882-2125   °[facility] [address],  °[place], [place] 1-800-853-5163 or 336-641-4981   °Substance Abuse Resources °Organization         Address  Phone  Notes  °[facility]  336-882-2125   °[facility]  336-784-9470   °[facility]  336-285-9073     °[facility]  336-845-3988   °Residential & Outpatient Substance Abuse Program  1-800-659-3381   °Psychological Services °Organization         Address  Phone  Notes  °[facility]  336- 832-9600   °[facility]  336- 378-7881   °[facility] [address], [place] 1-800-853-5163 or 336-641-4981   ° °Mobile Crisis Teams °Organization         Address  Phone  Notes  °[facility], Mobile Crisis Care Unit  1-877-626-1772   °Assertive °[facility] ° [address] [place], [place] 336-834-9664   °Sharon DeEsch [address] °[place] 336-554-5454   ° °Self-Help/Support Groups °Organization         Address  Phone             Notes  °[facility] - variety of support groups  336- 373-1402 Call for more information  °Narcotics Anonymous (NA), [facility] [address], °[place]  2 meetings at this location  ° °Residential Treatment Programs °Organization         Address  Phone  Notes  °[facility] [address],    °[place]  1-866-801-8205   °[facility] ° [address], [place], [place] 704-293-8524   °[facility] [address], [place] 336-845-3988 Admissions: 8am-3pm M-F  °[facility] [address]    °[place], [place] 336-841-1104   °[facility] [address], [place], [place] 336-379-7146   °[facility] [address]  °[place], [place] 336-285-9073   °[facility] - Intensive Outpatient [address], [place], [place] 336-852-3033   °[facility] [address]  °[place], [place] 1-877-615-2722 or 336-784-9470   °[facility] ([facility]) [address] [place], [place] 336-227-7417 Accepts Medicaid  °[facility] [address]  °[place] 1-800-659-3381 Substance Abuse/Addiction Treatment  ° °[place]  Behavioral Health Resources °  Organization         Address  Phone  Notes  °[facility]  (888) 581-9988   °Julie Brannon, PhD [address] [place], [place]   (336) 349-5553 or (336) 951-0000   °[facility]   [address] °[place], [place] (336) 349-4454   °[facility] [address], [place], [place] (336) 342-8316 Insurance/Medicaid/sponsorship through [facility]  °[facility] [address]                                    [place], [place] (336) 342-8316 Therapy/tele-psych/case  °[facility] [address]  ° [place], [place] (336) 349-2233    °Dr. Arfeen  (336) 349-4544   °[facility] [place]  [employer] [facility] [facility] 1) [address],  °2) [address], [place] °3)  [address] [place] [address], [place] (336) 349-3220 °(336) 342-7768 ° °(336) 342-8140   °[place] [facility] (336) 342-1394 or (336) 342-3537 (After Hours)    ° ° ° °

## 2015-06-05 NOTE — ED Notes (Signed)
Slipped on steps yesterday-pain to lower back, left buttock pain with decreased "feeling to left leg"-steady gait to triage

## 2015-06-05 NOTE — ED Notes (Signed)
Patient transported to X-ray 

## 2015-06-05 NOTE — ED Provider Notes (Signed)
CSN: [id number]     Arrival date & time 06/05/15  1655 History   First MD Initiated Contact with Patient 06/05/15 1814     Chief Complaint  Patient presents with  . Fall     (Consider location/radiation/quality/duration/timing/severity/associated sxs/prior Treatment) Patient is a 39 y.o. female presenting with fall. The history is provided by the patient. No language interpreter was used.  Fall Associated symptoms include numbness. Pertinent negatives include no weakness.   Barbara Barr is a 39 year old female who presents after slip and fall while going down the stairs yesterday. She states she was able to get up immediately and walk. She denies any head injury or loss of consciousness. She states that she woke up this morning and the pain was worse. She is complaining of left-sided back pain that radiates down her left leg with intermittent numbness. She denies any bowel or bladder incontinence or retention. She denies any weakness in her lower extremities.   Past Medical History  Diagnosis Date  . Sinusitis   . Gestational diabetes   . ADHD (attention deficit hyperactivity disorder)   . Anxiety   . Depression   . Fatigue   . Headache   . Obsessive-compulsive disorder    Past Surgical History  Procedure Laterality Date  . Cholecystectomy  2003  . Tubal ligation  2006   Family History  Problem Relation Age of Onset  . Dementia Maternal Aunt   . Seizures Maternal Aunt   . ADD / ADHD Maternal Uncle   . Drug abuse Maternal Uncle    History  Substance Use Topics  . Smoking status: Current Every Day Smoker -- 1.00 packs/day for 13 years    Types: Cigarettes  . Smokeless tobacco: Not on file  . Alcohol Use: No   OB History    No data available     Review of Systems  Musculoskeletal: Positive for back pain. Negative for gait problem.  Skin: Negative for wound.  Neurological: Positive for numbness. Negative for weakness.  All other systems reviewed and are  negative.     Allergies  Lexapro and Codeine  Home Medications   Prior to Admission medications   Medication Sig Start Date End Date Taking? Authorizing Provider  amphetamine-dextroamphetamine (ADDERALL) 10 MG tablet Generic OK. 05/10/15   Thresa Ross, MD  methocarbamol (ROBAXIN) 500 MG tablet Take 1 tablet (500 mg total) by mouth 2 (two) times daily. 06/05/15   Aisa Schoeppner Patel-Mills, PA-C  naproxen (NAPROSYN) 500 MG tablet Take 1 tablet (500 mg total) by mouth 2 (two) times daily. 06/05/15   Jatavius Ellenwood Patel-Mills, PA-C   BP 126/74 mmHg  Pulse 91  Temp(Src) 98.3 F (36.8 C) (Oral)  Resp 18  Ht  (1.651 m)  Wt 189 lb (85.73 kg)  BMI 31.45 kg/m2  SpO2 100%  LMP 04/23/2015 Physical Exam  Constitutional: She is oriented to person, place, and time. She appears well-developed.  HENT:  Head: Normocephalic and atraumatic.  Eyes: Conjunctivae are normal.  Neck: Normal range of motion. Neck supple.  Cardiovascular: Normal rate.   Pulmonary/Chest: Effort normal. No respiratory distress.  Abdominal: Soft. She exhibits no distension. There is no tenderness.  Musculoskeletal: Normal range of motion.  No midline lumbar vertebral tenderness to palpation. She does have left-sided or vertebral lumbar tenderness to palpation. No ecchymosis, lacerations, abrasions noted on the back. Negative straight leg raise bilaterally. Able to dorsi and plantar flex without difficulty. 2+ DP pulses bilateral extremities.  Neurological: She is alert and oriented to  person, place, and time. She has normal strength. No sensory deficit.  Ambulatory with steady gait.  Skin: Skin is warm and dry.  Psychiatric: She has a normal mood and affect. Her behavior is normal.  Nursing note and vitals reviewed.   ED Course  Procedures (including critical care time) Labs Review Labs Reviewed  PREGNANCY, URINE    Imaging Review Dg Lumbar Spine Complete  06/05/2015   CLINICAL DATA:  39 year old female with lumbar spine  pain after falling down a flight of stairs yesterday. Positive radiculopathy on the left.  EXAM: LUMBAR SPINE - COMPLETE 4+ VIEW  COMPARISON:  None.  FINDINGS: There is no evidence of lumbar spine fracture. Alignment is normal. Intervertebral disc spaces are maintained. Tubal ligation clips project over the anatomic pelvis. Surgical clips in a right upper quadrant suggest prior cholecystectomy.  IMPRESSION: Negative.   Electronically Signed   By: Malachy Moan M.D.   On: 06/05/2015 19:19     EKG Interpretation None      MDM   Final diagnoses:  Left-sided low back pain without sciatica  Patient presents for left-sided back pain after fall. Vitals stable. X-ray of lumbar spine is negative for acute fracture. I believe this is musculoskeletal related. She is neurovascularly intact and has no concerning signs for cauda equina syndrome. She was given Robaxin and naproxen. I gave the patient return precautions. I discussed follow-up and patient verbally agrees with the plan.     Catha Gosselin, PA-C 06/05/15 2118  Richardean Canal, MD 06/05/15 [phone]

## 2015-06-05 NOTE — ED Notes (Signed)
Pa  at bedside. 

## 2015-06-10 ENCOUNTER — Emergency Department (HOSPITAL_COMMUNITY): Payer: BLUE CROSS/BLUE SHIELD

## 2015-06-10 ENCOUNTER — Emergency Department (HOSPITAL_COMMUNITY)
Admission: EM | Admit: 2015-06-10 | Discharge: 2015-06-10 | Disposition: A | Discharge status: Home / Self Care | Payer: BLUE CROSS/BLUE SHIELD | Attending: Emergency Medicine | Admitting: Emergency Medicine

## 2015-06-10 ENCOUNTER — Encounter (HOSPITAL_COMMUNITY): Payer: Self-pay

## 2015-06-10 DIAGNOSIS — S4992XA Unspecified injury of left shoulder and upper arm, initial encounter: Secondary | ICD-10-CM | POA: Insufficient documentation

## 2015-06-10 DIAGNOSIS — Y92410 Unspecified street and highway as the place of occurrence of the external cause: Secondary | ICD-10-CM | POA: Insufficient documentation

## 2015-06-10 DIAGNOSIS — F909 Attention-deficit hyperactivity disorder, unspecified type: Secondary | ICD-10-CM | POA: Diagnosis not present

## 2015-06-10 DIAGNOSIS — Z791 Long term (current) use of non-steroidal anti-inflammatories (NSAID): Secondary | ICD-10-CM | POA: Diagnosis not present

## 2015-06-10 DIAGNOSIS — Z8709 Personal history of other diseases of the respiratory system: Secondary | ICD-10-CM | POA: Diagnosis not present

## 2015-06-10 DIAGNOSIS — S199XXA Unspecified injury of neck, initial encounter: Secondary | ICD-10-CM | POA: Diagnosis not present

## 2015-06-10 DIAGNOSIS — R000 Tachycardia, unspecified: Secondary | ICD-10-CM | POA: Diagnosis not present

## 2015-06-10 DIAGNOSIS — Y9389 Activity, other specified: Secondary | ICD-10-CM | POA: Diagnosis not present

## 2015-06-10 DIAGNOSIS — F419 Anxiety disorder, unspecified: Secondary | ICD-10-CM | POA: Insufficient documentation

## 2015-06-10 DIAGNOSIS — R110 Nausea: Secondary | ICD-10-CM | POA: Insufficient documentation

## 2015-06-10 DIAGNOSIS — Y999 Unspecified external cause status: Secondary | ICD-10-CM | POA: Insufficient documentation

## 2015-06-10 DIAGNOSIS — Z8632 Personal history of gestational diabetes: Secondary | ICD-10-CM | POA: Diagnosis not present

## 2015-06-10 DIAGNOSIS — Z720 Tobacco use: Secondary | ICD-10-CM | POA: Diagnosis not present

## 2015-06-10 DIAGNOSIS — Z79899 Other long term (current) drug therapy: Secondary | ICD-10-CM | POA: Insufficient documentation

## 2015-06-10 DIAGNOSIS — S3992XA Unspecified injury of lower back, initial encounter: Secondary | ICD-10-CM | POA: Insufficient documentation

## 2015-06-10 MED ORDER — DIAZEPAM 5 MG PO TABS
5.0000 mg | ORAL_TABLET | Freq: Once | ORAL | Status: AC
  Administered 2015-06-10: 5 mg via ORAL
  Filled 2015-06-10: qty 1

## 2015-06-10 MED ORDER — TRAMADOL HCL 50 MG PO TABS: 50.0000 mg | ORAL_TABLET | Freq: Four times a day (QID) | ORAL | Status: DC | PRN

## 2015-06-10 MED ORDER — ONDANSETRON 4 MG PO TBDP
4.0000 mg | ORAL_TABLET | Freq: Once | ORAL | Status: AC
  Administered 2015-06-10: 4 mg via ORAL
  Filled 2015-06-10: qty 1

## 2015-06-10 MED ORDER — KETOROLAC TROMETHAMINE 30 MG/ML IJ SOLN
30.0000 mg | Freq: Once | INTRAMUSCULAR | Status: AC
  Administered 2015-06-10: 30 mg via INTRAVENOUS
  Filled 2015-06-10: qty 1

## 2015-06-10 MED ORDER — DIAZEPAM 5 MG PO TABS: 5.0000 mg | ORAL_TABLET | Freq: Two times a day (BID) | ORAL | Status: DC

## 2015-06-10 MED ORDER — SODIUM CHLORIDE 0.9 % IV BOLUS (SEPSIS)
1000.0000 mL | Freq: Once | INTRAVENOUS | Status: AC
  Administered 2015-06-10: 1000 mL via INTRAVENOUS

## 2015-06-10 NOTE — ED Notes (Signed)
Pt stable, ambulatory, states understanding of discharge instructions 
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
## 2015-06-13 ENCOUNTER — Encounter: Payer: Self-pay | Admitting: Osteopathic Medicine

## 2015-06-13 ENCOUNTER — Encounter (HOSPITAL_COMMUNITY): Payer: Self-pay | Admitting: Psychiatry

## 2015-06-13 ENCOUNTER — Ambulatory Visit (INDEPENDENT_AMBULATORY_CARE_PROVIDER_SITE_OTHER): Payer: Self-pay | Admitting: Osteopathic Medicine

## 2015-06-13 ENCOUNTER — Ambulatory Visit (INDEPENDENT_AMBULATORY_CARE_PROVIDER_SITE_OTHER): Payer: BLUE CROSS/BLUE SHIELD | Admitting: Psychiatry

## 2015-06-13 VITALS — BP 122/71 | HR 119 | Ht 65.0 in | Wt 189.0 lb

## 2015-06-13 VITALS — BP 132/82 | HR 94 | Ht 65.0 in | Wt 188.4 lb

## 2015-06-13 DIAGNOSIS — Z79899 Other long term (current) drug therapy: Secondary | ICD-10-CM

## 2015-06-13 DIAGNOSIS — F900 Attention-deficit hyperactivity disorder, predominantly inattentive type: Secondary | ICD-10-CM | POA: Diagnosis not present

## 2015-06-13 DIAGNOSIS — R0781 Pleurodynia: Secondary | ICD-10-CM

## 2015-06-13 DIAGNOSIS — F909 Attention-deficit hyperactivity disorder, unspecified type: Secondary | ICD-10-CM

## 2015-06-13 DIAGNOSIS — R5383 Other fatigue: Secondary | ICD-10-CM | POA: Diagnosis not present

## 2015-06-13 DIAGNOSIS — Z13220 Encounter for screening for lipoid disorders: Secondary | ICD-10-CM

## 2015-06-13 DIAGNOSIS — F331 Major depressive disorder, recurrent, moderate: Secondary | ICD-10-CM

## 2015-06-13 DIAGNOSIS — F329 Major depressive disorder, single episode, unspecified: Secondary | ICD-10-CM

## 2015-06-13 DIAGNOSIS — S134XXA Sprain of ligaments of cervical spine, initial encounter: Secondary | ICD-10-CM

## 2015-06-13 DIAGNOSIS — M5432 Sciatica, left side: Secondary | ICD-10-CM

## 2015-06-13 DIAGNOSIS — M5442 Lumbago with sciatica, left side: Secondary | ICD-10-CM

## 2015-06-13 DIAGNOSIS — F32A Depression, unspecified: Secondary | ICD-10-CM

## 2015-06-13 LAB — COMPLETE METABOLIC PANEL WITH GFR
ALT: 32 U/L — ABNORMAL HIGH (ref 6–29)
AST: 19 U/L (ref 10–30)
Albumin: 3.8 g/dL (ref 3.6–5.1)
Alkaline Phosphatase: 71 U/L (ref 33–115)
BILIRUBIN TOTAL: 0.3 mg/dL (ref 0.2–1.2)
BUN: 11 mg/dL (ref 7–25)
CALCIUM: 9.4 mg/dL (ref 8.6–10.2)
CHLORIDE: 103 mmol/L (ref 98–110)
CO2: 24 mmol/L (ref 20–31)
CREATININE: 0.65 mg/dL (ref 0.50–1.10)
GFR, Est Non African American: >89 mL/min (ref >=60)
Glucose, Bld: 137 mg/dL — ABNORMAL HIGH (ref 65–99)
Potassium: 3.8 mmol/L (ref 3.5–5.3)
Sodium: 136 mmol/L (ref 135–146)
TOTAL PROTEIN: 7 g/dL (ref 6.1–8.1)

## 2015-06-13 LAB — CBC WITH DIFFERENTIAL/PLATELET
Basophils Absolute: 0 10*3/uL (ref 0.0–0.1)
Basophils Relative: 0 % (ref 0–1)
EOS ABS: 0.1 10*3/uL (ref 0.0–0.7)
Eosinophils Relative: 1 % (ref 0–5)
HCT: 39.4 % (ref 36.0–46.0)
Hemoglobin: 13.2 g/dL (ref 12.0–15.0)
Lymphocytes Relative: 26 % (ref 12–46)
Lymphs Abs: 2.6 10*3/uL (ref 0.7–4.0)
MCH: 28.4 pg (ref 26.0–34.0)
MCHC: 33.5 g/dL (ref 30.0–36.0)
MCV: 84.9 fL (ref 78.0–100.0)
MPV: 9.9 fL (ref 8.6–12.4)
Monocytes Absolute: 0.5 10*3/uL (ref 0.1–1.0)
Monocytes Relative: 5 % (ref 3–12)
Neutro Abs: 6.9 10*3/uL (ref 1.7–7.7)
Neutrophils Relative %: 68 % (ref 43–77)
PLATELETS: 280 10*3/uL (ref 150–400)
RBC: 4.64 MIL/uL (ref 3.87–5.11)
RDW: 14 % (ref 11.5–15.5)
WBC: 10.1 10*3/uL (ref 4.0–10.5)

## 2015-06-13 LAB — LIPID PANEL
CHOLESTEROL: 168 mg/dL (ref 125–200)
HDL: 37 mg/dL — ABNORMAL LOW (ref >=46)
LDL Cholesterol: 111 mg/dL (ref <130)
TRIGLYCERIDES: 99 mg/dL (ref <150)
Total CHOL/HDL Ratio: 4.5 Ratio (ref <=5.0)
VLDL: 20 mg/dL (ref <30)

## 2015-06-13 MED ORDER — KETOPROFEN 10 % CREA: TOPICAL_CREAM | Status: DC

## 2015-06-13 MED ORDER — GABAPENTIN 300 MG PO CAPS: 300.0000 mg | ORAL_CAPSULE | Freq: Three times a day (TID) | ORAL | Status: DC

## 2015-06-13 MED ORDER — AMPHETAMINE-DEXTROAMPHETAMINE 10 MG PO TABS: ORAL_TABLET | ORAL | Status: DC

## 2015-06-13 MED ORDER — INDOMETHACIN 25 MG PO CAPS: 25.0000 mg | ORAL_CAPSULE | Freq: Two times a day (BID) | ORAL | Status: DC

## 2015-06-13 NOTE — Patient Instructions (Signed)
Sciatica Sciatica is pain, weakness, numbness, or tingling along the path of the sciatic nerve. The nerve starts in the lower back and runs down the back of each leg. The nerve controls the muscles in the lower leg and in the back of the knee, while also providing sensation to the back of the thigh, lower leg, and the sole of your foot. Sciatica is a symptom of another medical condition. For instance, nerve damage or certain conditions, such as a herniated disk or bone spur on the spine, pinch or put pressure on the sciatic nerve. This causes the pain, weakness, or other sensations normally associated with sciatica. Generally, sciatica only affects one side of the body. CAUSES   Herniated or slipped disc.  Degenerative disk disease.  A pain disorder involving the narrow muscle in the buttocks (piriformis syndrome).  Pelvic injury or fracture.  Pregnancy.  Tumor (rare). SYMPTOMS  Symptoms can vary from mild to very severe. The symptoms usually travel from the low back to the buttocks and down the back of the leg. Symptoms can include:  Mild tingling or dull aches in the lower back, leg, or hip.  Numbness in the back of the calf or sole of the foot.  Burning sensations in the lower back, leg, or hip.  Sharp pains in the lower back, leg, or hip.  Leg weakness.  Severe back pain inhibiting movement. These symptoms may get worse with coughing, sneezing, laughing, or prolonged sitting or standing. Also, being overweight may worsen symptoms. DIAGNOSIS  Your caregiver will perform a physical exam to look for common symptoms of sciatica. He or she may ask you to do certain movements or activities that would trigger sciatic nerve pain. Other tests may be performed to find the cause of the sciatica. These may include:  Blood tests.  X-rays.  Imaging tests, such as an MRI or CT scan. TREATMENT  Treatment is directed at the cause of the sciatic pain. Sometimes, treatment is not necessary  and the pain and discomfort goes away on its own. If treatment is needed, your caregiver may suggest:  Over-the-counter medicines to relieve pain.  Prescription medicines, such as anti-inflammatory medicine, muscle relaxants, or narcotics.  Applying heat or ice to the painful area.  Steroid injections to lessen pain, irritation, and inflammation around the nerve.  Reducing activity during periods of pain.  Exercising and stretching to strengthen your abdomen and improve flexibility of your spine. Your caregiver may suggest losing weight if the extra weight makes the back pain worse.  Physical therapy.  Surgery to eliminate what is pressing or pinching the nerve, such as a bone spur or part of a herniated disk. HOME CARE INSTRUCTIONS   Only take over-the-counter or prescription medicines for pain or discomfort as directed by your caregiver.  Apply ice to the affected area for 20 minutes, 3-4 times a day for the first 48-72 hours. Then try heat in the same way.  Exercise, stretch, or perform your usual activities if these do not aggravate your pain.  Attend physical therapy sessions as directed by your caregiver.  Keep all follow-up appointments as directed by your caregiver.  Do not wear high heels or shoes that do not provide proper support.  Check your mattress to see if it is too soft. A firm mattress may lessen your pain and discomfort. SEEK IMMEDIATE MEDICAL CARE IF:   You lose control of your bowel or bladder (incontinence).  You have increasing weakness in the lower back, pelvis, buttocks,   or legs.  You have redness or swelling of your back.  You have a burning sensation when you urinate.  You have pain that gets worse when you lie down or awakens you at night.  Your pain is worse than you have experienced in the past.  Your pain is lasting longer than 4 weeks.  You are suddenly losing weight without reason. MAKE SURE YOU:  Understand these  instructions.  Will watch your condition.  Will get help right away if you are not doing well or get worse. Document Released: 10/08/2001 Document Revised: 04/14/2012 Document Reviewed: 02/23/2012 [employer] Patient Information 2015 [employer]. This information is not intended to replace advice given to you by your health care provider. Make sure you discuss any questions you have with your health care provider.  
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
## 2015-06-19 ENCOUNTER — Ambulatory Visit: Payer: Self-pay | Attending: Osteopathic Medicine | Admitting: Physical Therapy

## 2015-06-19 ENCOUNTER — Encounter: Payer: Self-pay | Admitting: Physical Therapy

## 2015-06-19 DIAGNOSIS — M542 Cervicalgia: Secondary | ICD-10-CM

## 2015-06-19 DIAGNOSIS — M25512 Pain in left shoulder: Secondary | ICD-10-CM

## 2015-06-19 DIAGNOSIS — M545 Low back pain, unspecified: Secondary | ICD-10-CM

## 2015-06-19 DIAGNOSIS — M79605 Pain in left leg: Secondary | ICD-10-CM
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
## 2015-06-21 ENCOUNTER — Ambulatory Visit: Payer: Self-pay | Admitting: Rehabilitation

## 2015-06-26 ENCOUNTER — Ambulatory Visit: Payer: BLUE CROSS/BLUE SHIELD | Admitting: Physical Therapy

## 2015-06-26 DIAGNOSIS — M545 Low back pain, unspecified: Secondary | ICD-10-CM

## 2015-06-26 DIAGNOSIS — M542 Cervicalgia: Secondary | ICD-10-CM

## 2015-06-26 DIAGNOSIS — M25512 Pain in left shoulder: Secondary | ICD-10-CM

## 2015-06-26 DIAGNOSIS — M79605 Pain in left leg: Secondary | ICD-10-CM

## 2015-06-26 NOTE — Therapy (Signed)
[facility] [address]  [address] [place], [place], [address] Phone: [phone]   Fax:  [phone]  Physical Therapy Treatment  Patient Details  Name: Barbara Barr MRN: [id number] Date of Birth: 12/30/1975 Referring Provider:  Sunnie Nielsen, DO  Encounter Date: 06/26/2015      PT End of Session - 06/26/15 0810    Visit Number 2   Number of Visits 12   Date for PT Re-Evaluation 07/31/15   PT Start Time 0808   PT Stop Time 0928   PT Time Calculation (min) 80 min      Past Medical History  Diagnosis Date  . Sinusitis   . Gestational diabetes   . ADHD (attention deficit hyperactivity disorder)   . Anxiety   . Depression   . Fatigue   . Headache   . Obsessive-compulsive disorder   . History of gestational diabetes     Past Surgical History  Procedure Laterality Date  . Cholecystectomy  2003  . Tubal ligation  2006    There were no vitals filed for this visit.  Visit Diagnosis:  LBP radiating to left leg  Pain in joint, shoulder region, left  Neck pain      Subjective Assessment - 06/26/15 0811    Subjective States tape seem to help some.  States has good/bad days.  Has noted L LE n/t especially with prolonged sitting. Also notes intermittent spasms throughout L scapula and in L Lower leg.   Currently in Pain? Yes   Pain Score 7    Pain Location Back   Pain Orientation Left;Lower;Mid   Pain Score 6   Pain Location Shoulder   Pain Orientation Left       TODAY'S TREATMENT TherEx - NuStep lvl 4, 3' L HS and Piri stretch  Manual - R side-lying L scap mobes grade 3 L LE Nerve Glides Prone grade 2 CPA throughout l-spine X-hand l-spine distraction (pt states feels very good)  TherEx - cat/camel 5x (noted L thoracic pain wrapping around to chest)  Manual - R side-lying L mid rib mobes with deep breathing then prone L t-spine UPA grade 2 with some grade 3 mobes.  TherEx - R side-lying  over pillow L shoulder ABD AROM with deep breathing to facilitate normal rib motion (seemed to note less pain with bed mobility following this).  Kinesiotape: 4 strips - 2 at 50% along B t-spine and l-spine paraspinals then horizontal 75% above T/L and L5;S1 areas.  Mechanical Traction - L-spine, hooklying, neutral pull, 50#/25#, 60"/20", 15'                               PT Short Term Goals - 06/26/15 0913    PT SHORT TERM GOAL #1   Title pt instructed in and independent with initial HEP by 06/30/15   Status On-going           PT Long Term Goals - 06/26/15 0913    PT LONG TERM GOAL #1   Title pt reports able to return to participation in family activities/recreational activities without limitation by pain by 07/31/15   Status On-going   PT LONG TERM GOAL #2   Title pt able to sleep without limitation by pain by 07/31/15   Status On-going   PT LONG TERM GOAL #3   Title pt denies UE or LE n/t by 07/31/15   Status On-going  Plan - 06/26/15 0914    Clinical Impression Statement pt with L LBP and L scapular/t-spine pain as cc today.  LBP addressed with traction and manual.  Attempted some exercises but seemed to cause L scapular/t-spine pain/spasm which pt states wraps around ribs to chest.  Performed some t-spine, scapular, and t-spine light mobes and included taping into t-spine today.   PT Next Visit Plan manual and modalities for decreased pain/tone to lumbar paraspinals as well as L scapular mms, L LE nerve glides and possible L UE nerve glides, extension bias treatment as able; mechanical traction (hooklying, neutral pull); continue taping if any benefit and possible taping to L shoulder/scapular area; possible dry needling when seen by this PT again.   Consulted and Agree with Plan of Care Patient        Problem List There are no active problems to display for this patient.   Betina Puckett PT, OCS 06/26/2015, 9:32 AM  [facility] [address]  [address] [place], [place], [address] Phone: [phone]   Fax:  [phone]

## 2015-06-27 ENCOUNTER — Ambulatory Visit: Payer: BLUE CROSS/BLUE SHIELD | Admitting: Physical Therapy

## 2015-06-27 DIAGNOSIS — M79605 Pain in left leg: Secondary | ICD-10-CM

## 2015-06-27 DIAGNOSIS — M25512 Pain in left shoulder: Secondary | ICD-10-CM

## 2015-06-27 DIAGNOSIS — M542 Cervicalgia: Secondary | ICD-10-CM

## 2015-06-27 DIAGNOSIS — M545 Low back pain: Secondary | ICD-10-CM

## 2015-06-27 NOTE — Therapy (Signed)
[facility] [address]  [address] [place], [place], [address] Phone: [phone]   Fax:  [phone]  Physical Therapy Treatment  Patient Details  Name: Barbara Barr MRN: [id number] Date of Birth: [birth year] Referring Provider:  Sunnie Nielsen, DO  Encounter Date: 06/27/2015      PT End of Session - 06/27/15 0806    Visit Number 3   Number of Visits 12   Date for PT Re-Evaluation 07/31/15   PT Start Time 0803   PT Stop Time 0908   PT Time Calculation (min) 65 min      Past Medical History  Diagnosis Date  . Sinusitis   . Gestational diabetes   . ADHD (attention deficit hyperactivity disorder)   . Anxiety   . Depression   . Fatigue   . Headache   . Obsessive-compulsive disorder   . History of gestational diabetes     Past Surgical History  Procedure Laterality Date  . Cholecystectomy  2003  . Tubal ligation  2006    There were no vitals filed for this visit.  Visit Diagnosis:  LBP radiating to left leg  Pain in joint, shoulder region, left  Neck pain      Subjective Assessment - 06/27/15 0805    Subjective States L shoulder very tender and sore from "everything we did yesterday" ratin 8/10.  Rates LBP 5-6/10 today.   Currently in Pain? Yes   Pain Score 6   5-6/10   Pain Location Back   Pain Orientation Left;Mid;Lower   Pain Score 8   Pain Location Shoulder  shoulder and scapular area   Pain Orientation Left         TODAY'S TREATMENT TherEx - NuStep lvl 4, 3'  Manual - Supine TPR and Stretch L pecs R side-lying L mid rib grade 2-3 mobes (mostly caudal today) and STM L scap mms  TherEx - Hooklying on 1/2 Foam Roll - t-spine/pec stretch x2';    Pullover 4# movement with breathing 2x10 (limited ability/ROM due to c/o L mid/lower rib pain and LBP)    B Horiz ABD Green TB 10x    B ER Green TB 15x Pelvic Tilt 10x3" Bridge 10x B HS, SKTC, and Piri stretch L LE Nerve  Glides Hooklying LE March 10x each (notes some L LBP which extends into lower t-spine)  Mechanical Traction - L-spine, hooklying, neutral pull, 60#/30#, 60"/20", 15' With IFC to B l-spine and lower T-spine (80-150Hz ) x 18'  (states this felt very good)            PT Short Term Goals - 06/26/15 0913    PT SHORT TERM GOAL #1   Title pt instructed in and independent with initial HEP by 06/30/15   Status On-going           PT Long Term Goals - 06/26/15 0913    PT LONG TERM GOAL #1   Title pt reports able to return to participation in family activities/recreational activities without limitation by pain by 07/31/15   Status On-going   PT LONG TERM GOAL #2   Title pt able to sleep without limitation by pain by 07/31/15   Status On-going   PT LONG TERM GOAL #3   Title pt denies UE or LE n/t by 07/31/15   Status On-going               Plan - 06/27/15 0926    Clinical Impression Statement pt noted benefit  with today's treatment and seems to be displaying some progress with LBP.  L shoulder/scapula/t-spine pain remains difficult.  Pain seems to wrap along ~#7 rib and could be due to t-spine vs rib joint strain.  Gentle mobes better tolerated there today.   PT Next Visit Plan manual and modalities for decreased pain/tone to lumbar paraspinals as well as L scapular mms, L LE nerve glides and possible L UE nerve glides, extension bias treatment as able; mechanical traction (hooklying, neutral pull); continue taping if any benefit and possible taping to L shoulder/scapular area; possible dry needling when seen by this PT again.   Consulted and Agree with Plan of Care Patient        Problem List There are no active problems to display for this patient.   [facility] 06/27/2015, 9:35 AM  [facility] [address]  [address] [place], [place], [address] Phone: [phone]   Fax:  [phone]

## 2015-06-29 ENCOUNTER — Ambulatory Visit: Payer: BLUE CROSS/BLUE SHIELD | Attending: Osteopathic Medicine | Admitting: Physical Therapy

## 2015-06-29 DIAGNOSIS — M542 Cervicalgia: Secondary | ICD-10-CM

## 2015-06-29 DIAGNOSIS — M545 Low back pain, unspecified: Secondary | ICD-10-CM

## 2015-06-29 DIAGNOSIS — M79605 Pain in left leg: Secondary | ICD-10-CM

## 2015-06-29 DIAGNOSIS — M25512 Pain in left shoulder: Secondary | ICD-10-CM

## 2015-06-29 NOTE — Therapy (Addendum)
[facility] [address]  [place], [place], [address] Phone: [phone]   Fax:  [phone]  Physical Therapy Treatment  Patient Details  Name: Barbara Barr MRN: [id number] Date of Birth: Aug 27, 1976 Referring Provider:  Emeterio Reeve, DO  Encounter Date: 06/29/2015      PT End of Session - 06/29/15 0809    Visit Number 4   Number of Visits 12   Date for PT Re-Evaluation 07/31/15   PT Start Time 0806   PT Stop Time 0900   PT Time Calculation (min) 54 min      Past Medical History  Diagnosis Date  . Sinusitis   . Gestational diabetes   . ADHD (attention deficit hyperactivity disorder)   . Anxiety   . Depression   . Fatigue   . Headache   . Obsessive-compulsive disorder   . History of gestational diabetes     Past Surgical History  Procedure Laterality Date  . Cholecystectomy  2003  . Tubal ligation  2006    There were no vitals filed for this visit.  Visit Diagnosis:  LBP radiating to left leg  Pain in joint, shoulder region, left  Neck pain      Subjective Assessment - 06/29/15 0806    Subjective states back felt better after last treatment and shoulder was nearly pain-free until this AM.  Pt states "must have slept on it".  States pain did not wake her.   Currently in Pain? Yes   Pain Score 6    Pain Location Back   Pain Orientation Left;Lower;Mid   Pain Score 5   Pain Location Shoulder  shoulder and scapul/t-spine area   Pain Orientation Left        TODAY'S TREATMENT  Manual - Supine TPR and Stretch L pecs R side-lying STM L scap mms then L mid rib grade 2-3 mobes (caudal and PA)  TherEx - Pelvic Tilt 10x3" Bridge w/ Blue TB at knees 10x3" Hooklying Hip ABD Blue TB 10x3" B HS, SKTC, and Piri stretch L LE Nerve Glides Partial Curl-up 6x (stopped due to c/o increasing L scapular pain) Hooklying LE March 10x each (notes some L LBP which extends into lower  t-spine)  Hooklying on Foam Roll - t-spine/pec stretch x30" (stopped and no further exercises on this due to pt unable to relax with c/o L scapular pain)  Mechanical Traction - L-spine, hooklying, neutral pull, 66#/33#, 60"/20", 18' With IFC to B l-spine and lower T-spine (80-'150Hz' ) x 20'                           PT Short Term Goals - 06/26/15 0913    PT SHORT TERM GOAL #1   Title pt instructed in and independent with initial HEP by 06/30/15   Status On-going           PT Long Term Goals - 06/26/15 0913    PT LONG TERM GOAL #1   Title pt reports able to return to participation in family activities/recreational activities without limitation by pain by 07/31/15   Status On-going   PT LONG TERM GOAL #2   Title pt able to sleep without limitation by pain by 07/31/15   Status On-going   PT LONG TERM GOAL #3   Title pt denies UE or LE n/t by 07/31/15   Status On-going  Plan - 06/29/15 0845    Clinical Impression Statement pt with good benefit following last treatment but did not tolerate similar treatment today due to c/o L scapular/t-spine pain with foam roller and unable to relax.  Will return to this next week.   PT Next Visit Plan instruct in HEP; manual and modalities for decreased pain/tone to lumbar paraspinals as well as L scapular mms, L LE nerve glides and possible L UE nerve glides, extension bias treatment as able; mechanical traction (hooklying, neutral pull); continue taping if any benefit and possible taping to L shoulder/scapular area; possible dry needling when seen by this PT again.   Consulted and Agree with Plan of Care Patient        Problem List There are no active problems to display for this patient.   Darnita Woodrum PT, OCS 06/29/2015, 9:40 AM  [facility] [address]  [place], [place], [address] Phone: [phone]   Fax:  [phone]     PHYSICAL  THERAPY DISCHARGE SUMMARY  Visits from Start of Care: 4 of 12  Current functional level related to goals / functional outcomes: unknown   Remaining deficits: L shoulder/t-spine pain   Education / Equipment: HEP Plan: Patient agrees to discharge.  Patient goals were not met. Patient is being discharged due to not returning since the last visit.  ?????       Barbara Barr has not been seen since 06/29/15.  She no-showed 3 appointment since that time and has not returned our numerous calls.  She is therefore being discharged from our care at this time.  Barbara Barr PT, OCS 07/12/2015  2:13 PM

## 2015-07-05 ENCOUNTER — Ambulatory Visit: Payer: BLUE CROSS/BLUE SHIELD | Admitting: Rehabilitation

## 2015-07-06 ENCOUNTER — Ambulatory Visit: Payer: BLUE CROSS/BLUE SHIELD | Admitting: Physical Therapy

## 2015-07-10 ENCOUNTER — Ambulatory Visit: Payer: BLUE CROSS/BLUE SHIELD | Admitting: Physical Therapy

## 2015-07-11 ENCOUNTER — Ambulatory Visit: Payer: Self-pay | Admitting: Osteopathic Medicine

## 2015-07-12 ENCOUNTER — Ambulatory Visit: Payer: BLUE CROSS/BLUE SHIELD | Admitting: Rehabilitation

## 2015-07-13 ENCOUNTER — Ambulatory Visit: Payer: Self-pay | Admitting: Osteopathic Medicine

## 2015-07-14 ENCOUNTER — Ambulatory Visit: Payer: BLUE CROSS/BLUE SHIELD | Admitting: Physical Therapy

## 2015-07-17 ENCOUNTER — Ambulatory Visit: Payer: BLUE CROSS/BLUE SHIELD | Admitting: Rehabilitation

## 2015-07-19 ENCOUNTER — Ambulatory Visit: Payer: BLUE CROSS/BLUE SHIELD | Admitting: Physical Therapy

## 2015-07-20 ENCOUNTER — Ambulatory Visit: Payer: BLUE CROSS/BLUE SHIELD | Admitting: Physical Therapy

## 2015-07-24 ENCOUNTER — Ambulatory Visit: Payer: BLUE CROSS/BLUE SHIELD | Admitting: Rehabilitation

## 2015-07-26 ENCOUNTER — Ambulatory Visit: Payer: BLUE CROSS/BLUE SHIELD | Admitting: Physical Therapy

## 2015-07-28 ENCOUNTER — Ambulatory Visit: Payer: BLUE CROSS/BLUE SHIELD | Admitting: Rehabilitation

## 2015-08-11 ENCOUNTER — Ambulatory Visit (HOSPITAL_COMMUNITY): Payer: Self-pay | Admitting: Psychiatry

## 2015-08-11 ENCOUNTER — Telehealth (HOSPITAL_COMMUNITY): Payer: Self-pay | Admitting: *Deleted

## 2015-08-11 MED ORDER — AMPHETAMINE-DEXTROAMPHETAMINE 10 MG PO TABS: ORAL_TABLET | ORAL | Status: DC

## 2015-08-11 NOTE — Telephone Encounter (Signed)
adderrall printed

## 2015-08-11 NOTE — Telephone Encounter (Signed)
Pt needs prescription for Adderall 10mg . Please call once prescription is ready.

## 2015-09-11 ENCOUNTER — Emergency Department (HOSPITAL_BASED_OUTPATIENT_CLINIC_OR_DEPARTMENT_OTHER)
Admission: EM | Admit: 2015-09-11 | Discharge: 2015-09-11 | Disposition: A | Discharge status: Home / Self Care | Payer: BLUE CROSS/BLUE SHIELD | Attending: Emergency Medicine | Admitting: Emergency Medicine

## 2015-09-11 ENCOUNTER — Encounter (HOSPITAL_BASED_OUTPATIENT_CLINIC_OR_DEPARTMENT_OTHER): Payer: Self-pay

## 2015-09-11 ENCOUNTER — Emergency Department (HOSPITAL_BASED_OUTPATIENT_CLINIC_OR_DEPARTMENT_OTHER): Payer: BLUE CROSS/BLUE SHIELD

## 2015-09-11 DIAGNOSIS — Z8632 Personal history of gestational diabetes: Secondary | ICD-10-CM | POA: Insufficient documentation

## 2015-09-11 DIAGNOSIS — F17210 Nicotine dependence, cigarettes, uncomplicated: Secondary | ICD-10-CM | POA: Insufficient documentation

## 2015-09-11 DIAGNOSIS — S61512A Laceration without foreign body of left wrist, initial encounter: Secondary | ICD-10-CM | POA: Insufficient documentation

## 2015-09-11 DIAGNOSIS — Y280XXA Contact with sharp glass, undetermined intent, initial encounter: Secondary | ICD-10-CM | POA: Insufficient documentation

## 2015-09-11 DIAGNOSIS — F909 Attention-deficit hyperactivity disorder, unspecified type: Secondary | ICD-10-CM | POA: Insufficient documentation

## 2015-09-11 DIAGNOSIS — Z8709 Personal history of other diseases of the respiratory system: Secondary | ICD-10-CM | POA: Insufficient documentation

## 2015-09-11 DIAGNOSIS — Y9389 Activity, other specified: Secondary | ICD-10-CM | POA: Insufficient documentation

## 2015-09-11 DIAGNOSIS — Z23 Encounter for immunization: Secondary | ICD-10-CM | POA: Insufficient documentation

## 2015-09-11 DIAGNOSIS — F419 Anxiety disorder, unspecified: Secondary | ICD-10-CM | POA: Insufficient documentation

## 2015-09-11 DIAGNOSIS — Y9289 Other specified places as the place of occurrence of the external cause: Secondary | ICD-10-CM | POA: Insufficient documentation

## 2015-09-11 DIAGNOSIS — Y998 Other external cause status: Secondary | ICD-10-CM | POA: Insufficient documentation

## 2015-09-11 MED ORDER — TETANUS-DIPHTH-ACELL PERTUSSIS 5-2.5-18.5 LF-MCG/0.5 IM SUSP
0.5000 mL | Freq: Once | INTRAMUSCULAR | Status: AC
  Administered 2015-09-11: 0.5 mL via INTRAMUSCULAR
  Filled 2015-09-11: qty 0.5

## 2015-09-11 NOTE — ED Notes (Signed)
Reports slid coming out the door and hand went through glass. Laceration noted to left wrist

## 2015-09-11 NOTE — ED Provider Notes (Signed)
CSN: [id number]     Arrival date & time 09/11/15  1413 History   First MD Initiated Contact with Patient 09/11/15 1441     Chief Complaint  Patient presents with  . Laceration     (Consider location/radiation/quality/duration/timing/severity/associated sxs/prior Treatment) HPI Comments: Pt comes in with c/o laceration to the left wrist. She states that she feel into a glass door. Denies numbness or weakness. Unsure of last tetanus.  The history is provided by the patient. No language interpreter was used.    Past Medical History  Diagnosis Date  . Sinusitis   . Gestational diabetes   . ADHD (attention deficit hyperactivity disorder)   . Anxiety   . Depression   . Fatigue   . Headache   . Obsessive-compulsive disorder   . History of gestational diabetes    Past Surgical History  Procedure Laterality Date  . Cholecystectomy  2003  . Tubal ligation  2006   Family History  Problem Relation Age of Onset  . Dementia Maternal Aunt   . Seizures Maternal Aunt   . ADD / ADHD Maternal Uncle   . Drug abuse Maternal Uncle    Social History  Substance Use Topics  . Smoking status: Current Every Day Smoker -- 1.00 packs/day for 13 years    Types: Cigarettes  . Smokeless tobacco: None  . Alcohol Use: No   OB History    No data available     Review of Systems  All other systems reviewed and are negative.     Allergies  Lexapro and Codeine  Home Medications   Prior to Admission medications   Medication Sig Start Date End Date Taking? Authorizing Provider  amphetamine-dextroamphetamine (ADDERALL) 10 MG tablet Generic OK. 08/11/15  Yes Thresa RossNadeem Akhtar, MD  gabapentin (NEURONTIN) 300 MG capsule Take 1 capsule (300 mg total) by mouth 3 (three) times daily. 06/13/15   Sunnie NielsenNatalie Alexander, DO  indomethacin (INDOCIN) 25 MG capsule Take 1 capsule (25 mg total) by mouth 2 (two) times daily with a meal. Patient not taking: Reported on 06/19/2015 06/13/15   Sunnie NielsenNatalie Alexander, DO   Ketoprofen 10 % CREA Apply topically to painful area of left side tid prn pain Patient not taking: Reported on 06/19/2015 06/13/15   Sunnie NielsenNatalie Alexander, DO   BP 133/83 mmHg  Pulse 102  Temp(Src) 98.2 F (36.8 C) (Oral)  Resp 16  Ht 5\' 4"  (1.626 m)  Wt 173 lb (78.472 kg)  BMI 29.68 kg/m2  SpO2 99% Physical Exam  Constitutional: She is oriented to person, place, and time. She appears well-developed and well-nourished.  Cardiovascular: Normal rate and regular rhythm.   Pulmonary/Chest: Effort normal and breath sounds normal.  Neurological: She is alert and oriented to person, place, and time.  Skin:  Laceration to the left wrist. Thru the dermis. No foreign body noted. Neurovascularly intact  Nursing note and vitals reviewed.   ED Course  .Marland Kitchen.Laceration Repair Date/Time: 09/11/2015 3:29 PM Performed by: Teressa LowerPICKERING, Jahmier Willadsen Authorized by: Teressa LowerPICKERING, Dashanae Longfield Consent: Verbal consent obtained. Risks and benefits: risks, benefits and alternatives were discussed Consent given by: patient Patient identity confirmed: verbally with patient Time out: Immediately prior to procedure a "time out" was called to verify the correct patient, procedure, equipment, support staff and site/side marked as required. Body area: upper extremity Location details: left wrist Laceration length: 2 cm Foreign bodies: no foreign bodies Irrigation solution: saline Skin closure: glue and Steri-Strips Technique: simple Approximation: close Approximation difficulty: simple Dressing: 4x4 sterile gauze Patient tolerance: Patient  tolerated the procedure well with no immediate complications   (including critical care time) Labs Review Labs Reviewed - No data to display  Imaging Review Dg Wrist Complete Left  09/11/2015  CLINICAL DATA:  Laceration after fall EXAM: LEFT WRIST - COMPLETE 3+ VIEW COMPARISON:  None. FINDINGS: Four views of the left wrist submitted. No acute fracture or subluxation. No radiopaque  foreign body. IMPRESSION: Negative. Electronically Signed   By: Natasha Mead M.D.   On: 09/11/2015 15:05   I have personally reviewed and evaluated these images and lab results as part of my medical decision-making.   EKG Interpretation None      MDM   Final diagnoses:  Wrist laceration, left, initial encounter    Wound closed without any problem. Neurovascularly intact.tdap updated.    Teressa Lower, NP 09/11/15 1530  Vanetta Mulders, MD 09/12/15 2056

## 2015-09-11 NOTE — Discharge Instructions (Signed)
Laceration Care, Adult  A laceration is a cut that goes through all layers of the skin. The cut also goes into the tissue that is right under the skin. Some cuts heal on their own. Others need to be closed with stitches (sutures), staples, skin adhesive strips, or wound glue. Taking care of your cut lowers your risk of infection and helps your cut to heal better.  HOW TO TAKE CARE OF YOUR CUT  For stitches or staples:  · Keep the wound clean and dry.  · If you were given a bandage (dressing), you should change it at least one time per day or as told by your doctor. You should also change it if it gets wet or dirty.  · Keep the wound completely dry for the first 24 hours or as told by your doctor. After that time, you may take a shower or a bath. However, make sure that the wound is not soaked in water until after the stitches or staples have been removed.  · Clean the wound one time each day or as told by your doctor:    Wash the wound with soap and water.    Rinse the wound with water until all of the soap comes off.    Pat the wound dry with a clean towel. Do not rub the wound.  · After you clean the wound, put a thin layer of antibiotic ointment on it as told by your doctor. This ointment:    Helps to prevent infection.    Keeps the bandage from sticking to the wound.  · Have your stitches or staples removed as told by your doctor.  If your doctor used skin adhesive strips:   · Keep the wound clean and dry.  · If you were given a bandage, you should change it at least one time per day or as told by your doctor. You should also change it if it gets dirty or wet.  · Do not get the skin adhesive strips wet. You can take a shower or a bath, but be careful to keep the wound dry.  · If the wound gets wet, pat it dry with a clean towel. Do not rub the wound.  · Skin adhesive strips fall off on their own. You can trim the strips as the wound heals. Do not remove any strips that are still stuck to the wound. They will  fall off after a while.  If your doctor used wound glue:  · Try to keep your wound dry, but you may briefly wet it in the shower or bath. Do not soak the wound in water, such as by swimming.  · After you take a shower or a bath, gently pat the wound dry with a clean towel. Do not rub the wound.  · Do not do any activities that will make you really sweaty until the skin glue has fallen off on its own.  · Do not apply liquid, cream, or ointment medicine to your wound while the skin glue is still on.  · If you were given a bandage, you should change it at least one time per day or as told by your doctor. You should also change it if it gets dirty or wet.  · If a bandage is placed over the wound, do not let the tape for the bandage touch the skin glue.  · Do not pick at the glue. The skin glue usually stays on for 5-10 days. Then, it   falls off of the skin.  General Instructions   · To help prevent scarring, make sure to cover your wound with sunscreen whenever you are outside after stitches are removed, after adhesive strips are removed, or when wound glue stays in place and the wound is healed. Make sure to wear a sunscreen of at least 30 SPF.  · Take over-the-counter and prescription medicines only as told by your doctor.  · If you were given antibiotic medicine or ointment, take or apply it as told by your doctor. Do not stop using the antibiotic even if your wound is getting better.  · Do not scratch or pick at the wound.  · Keep all follow-up visits as told by your doctor. This is important.  · Check your wound every day for signs of infection. Watch for:    Redness, swelling, or pain.    Fluid, blood, or pus.  · Raise (elevate) the injured area above the level of your heart while you are sitting or lying down, if possible.  GET HELP IF:  · You got a tetanus shot and you have any of these problems at the injection site:    Swelling.    Very bad pain.    Redness.    Bleeding.  · You have a fever.  · A wound that was  closed breaks open.  · You notice a bad smell coming from your wound or your bandage.  · You notice something coming out of the wound, such as wood or glass.  · Medicine does not help your pain.  · You have more redness, swelling, or pain at the site of your wound.  · You have fluid, blood, or pus coming from your wound.  · You notice a change in the color of your skin near your wound.  · You need to change the bandage often because fluid, blood, or pus is coming from the wound.  · You start to have a new rash.  · You start to have numbness around the wound.  GET HELP RIGHT AWAY IF:  · You have very bad swelling around the wound.  · Your pain suddenly gets worse and is very bad.  · You notice painful lumps near the wound or on skin that is anywhere on your body.  · You have a red streak going away from your wound.  · The wound is on your hand or foot and you cannot move a finger or toe like you usually can.  · The wound is on your hand or foot and you notice that your fingers or toes look pale or bluish.     This information is not intended to replace advice given to you by your health care provider. Make sure you discuss any questions you have with your health care provider.     Document Released: 04/01/2008 Document Revised: 02/28/2015 Document Reviewed: 10/10/2014  Elsevier Interactive Patient Education ©2016 [employer]

## 2015-09-19 ENCOUNTER — Ambulatory Visit (HOSPITAL_COMMUNITY): Payer: Self-pay | Admitting: Psychiatry

## 2016-07-13 ENCOUNTER — Emergency Department (HOSPITAL_BASED_OUTPATIENT_CLINIC_OR_DEPARTMENT_OTHER)
Admission: EM | Admit: 2016-07-13 | Discharge: 2016-07-13 | Disposition: A | Discharge status: Home / Self Care | Payer: Self-pay | Attending: Emergency Medicine | Admitting: Emergency Medicine

## 2016-07-13 ENCOUNTER — Encounter (HOSPITAL_BASED_OUTPATIENT_CLINIC_OR_DEPARTMENT_OTHER): Payer: Self-pay | Admitting: Emergency Medicine

## 2016-07-13 DIAGNOSIS — R42 Dizziness and giddiness: Secondary | ICD-10-CM | POA: Insufficient documentation

## 2016-07-13 DIAGNOSIS — R55 Syncope and collapse: Secondary | ICD-10-CM | POA: Insufficient documentation

## 2016-07-13 DIAGNOSIS — F17210 Nicotine dependence, cigarettes, uncomplicated: Secondary | ICD-10-CM | POA: Insufficient documentation

## 2016-07-13 DIAGNOSIS — F909 Attention-deficit hyperactivity disorder, unspecified type: Secondary | ICD-10-CM | POA: Insufficient documentation

## 2016-07-13 LAB — URINALYSIS, ROUTINE W REFLEX MICROSCOPIC
BILIRUBIN URINE: NEGATIVE
Glucose, UA: NEGATIVE mg/dL
HGB URINE DIPSTICK: NEGATIVE
KETONES UR: NEGATIVE mg/dL
Nitrite: NEGATIVE
Protein, ur: NEGATIVE mg/dL
SPECIFIC GRAVITY, URINE: 1.023 (ref 1.005–1.030)
pH: 6 (ref 5.0–8.0)

## 2016-07-13 LAB — COMPREHENSIVE METABOLIC PANEL
ALK PHOS: 66 U/L (ref 38–126)
ALT: 19 U/L (ref 14–54)
AST: 26 U/L (ref 15–41)
Albumin: 3.5 g/dL (ref 3.5–5.0)
Anion gap: 7 (ref 5–15)
BUN: 13 mg/dL (ref 6–20)
CALCIUM: 8.6 mg/dL — AB (ref 8.9–10.3)
CHLORIDE: 104 mmol/L (ref 101–111)
CO2: 23 mmol/L (ref 22–32)
CREATININE: 0.7 mg/dL (ref 0.44–1.00)
GFR calc non Af Amer: >60 mL/min (ref >60)
GLUCOSE: 171 mg/dL — AB (ref 65–99)
Potassium: 3.2 mmol/L — ABNORMAL LOW (ref 3.5–5.1)
SODIUM: 134 mmol/L — AB (ref 135–145)
Total Bilirubin: 0.3 mg/dL (ref 0.3–1.2)
Total Protein: 6.9 g/dL (ref 6.5–8.1)

## 2016-07-13 LAB — CBC WITH DIFFERENTIAL/PLATELET
BASOS PCT: 0 %
Basophils Absolute: 0 10*3/uL (ref 0.0–0.1)
EOS ABS: 0.1 10*3/uL (ref 0.0–0.7)
EOS PCT: 1 %
HCT: 40.5 % (ref 36.0–46.0)
HEMOGLOBIN: 13.7 g/dL (ref 12.0–15.0)
LYMPHS ABS: 3.3 10*3/uL (ref 0.7–4.0)
Lymphocytes Relative: 29 %
MCH: 29 pg (ref 26.0–34.0)
MCHC: 33.8 g/dL (ref 30.0–36.0)
MCV: 85.8 fL (ref 78.0–100.0)
MONO ABS: 0.5 10*3/uL (ref 0.1–1.0)
MONOS PCT: 5 %
Neutro Abs: 7.5 10*3/uL (ref 1.7–7.7)
Neutrophils Relative %: 65 %
Platelets: 251 10*3/uL (ref 150–400)
RBC: 4.72 MIL/uL (ref 3.87–5.11)
RDW: 13.2 % (ref 11.5–15.5)
WBC: 11.5 10*3/uL — ABNORMAL HIGH (ref 4.0–10.5)

## 2016-07-13 LAB — URINE MICROSCOPIC-ADD ON

## 2016-07-13 LAB — PREGNANCY, URINE: Preg Test, Ur: NEGATIVE

## 2016-07-13 MED ORDER — SODIUM CHLORIDE 0.9 % IV BOLUS (SEPSIS)
2000.0000 mL | Freq: Once | INTRAVENOUS | Status: AC
  Administered 2016-07-13: 2000 mL via INTRAVENOUS

## 2016-07-13 MED ORDER — POTASSIUM CHLORIDE CRYS ER 20 MEQ PO TBCR
40.0000 meq | EXTENDED_RELEASE_TABLET | Freq: Once | ORAL | Status: AC
  Administered 2016-07-13: 40 meq via ORAL
  Filled 2016-07-13: qty 2

## 2016-07-13 NOTE — Discharge Instructions (Signed)
Drink at least six 8 ounce glasses of water or Gatorade daily. [facility] in 2 days to get a primary care physician N to arrange to be seen if you continue to feel faint or lightheaded. Return if your condition worsens for any reason. Your blood potassium level today was mildly low at 3.2. You were given potassium here. You should get your blood potassium level rechecked within the next 2 or 3 weeks

## 2016-07-13 NOTE — ED Notes (Signed)
Pt given d/c instructions as per chart. Verbalizes understanding. No questions. 

## 2016-07-13 NOTE — ED Triage Notes (Signed)
Pt states fatigue and constant dizziness for past 2 days.  Pt states she passed out last Sunday and has had recurrent episode of dizziness off and on for years but never passed out prior to last Sunday.  Pt states dizziness normally comes and goes but has not gone away now for 2 days.

## 2016-07-13 NOTE — ED Provider Notes (Signed)
[facility] Provider Note   CSN: [id number] Arrival date & time: 07/13/16  1843 By signing my name below, I, Barbara Barr, attest that this documentation has been prepared under the direction and in the presence of Doug Sou, MD . Electronically Signed: Levon Barr, Scribe. 07/13/2016. 7:23 PM.   History   Chief Complaint Chief Complaint  Patient presents with  . Near Syncope  . Dizziness    HPI Barbara Barr is a 40 y.o. female who presents to the Emergency Department complaining of intermittent, worsening lightheadedness which began six days ago. She notes associated syncope 1x, fatigue, Dizziness and room getting dark. Symptoms worsened by moving around, or standing upright from a lying down position and are alleviated by laying down.Pt states that she felt lightheaded, lost her vision, and fainted six days ago, hitting her head. She then felt similar symptoms four days ago, but was able to lie down until they passed. Per pt, her symptoms have worsened over the past two days.  She states she has experienced these symptoms before, but they are becoming more frequent. She has never seen a doctor for this before. She denies any pain. Pt is smoker, but denies drinking or drug use. Pt is otherwise in good health. She is not currently followed by a PCP. Pt denies hematochezia or room spinning during the exam. She has no other complaints at this time. Denies chest pain denies abdominal pain denies headache denies shortness of breath no other associated symptoms. No treatment prior to coming here  The history is provided by the patient. No language interpreter was used.    Past Medical History:  Diagnosis Date  . ADHD (attention deficit hyperactivity disorder)   . Anxiety   . Depression   . Fatigue   . Gestational diabetes   . Headache   . History of gestational diabetes   . Obsessive-compulsive disorder   . Sinusitis     There are no active problems to display  for this patient.   Past Surgical History:  Procedure Laterality Date  . CHOLECYSTECTOMY  2003  . TUBAL LIGATION  2006    OB History    No data available       Home Medications    Prior to Admission medications   Medication Sig Start Date End Date Taking? Authorizing Provider  amphetamine-dextroamphetamine (ADDERALL) 10 MG tablet Generic OK. 08/11/15   Thresa Ross, MD  gabapentin (NEURONTIN) 300 MG capsule Take 1 capsule (300 mg total) by mouth 3 (three) times daily. 06/13/15   Sunnie Nielsen, DO  indomethacin (INDOCIN) 25 MG capsule Take 1 capsule (25 mg total) by mouth 2 (two) times daily with a meal. Patient not taking: Reported on 06/19/2015 06/13/15   Sunnie Nielsen, DO  Ketoprofen 10 % CREA Apply topically to painful area of left side tid prn pain Patient not taking: Reported on 06/19/2015 06/13/15   Sunnie Nielsen, DO    Family History Family History  Problem Relation Age of Onset  . Dementia Maternal Aunt   . Seizures Maternal Aunt   . ADD / ADHD Maternal Uncle   . Drug abuse Maternal Uncle     Social History Social History  Substance Use Topics  . Smoking status: Current Every Day Smoker    Packs/day: 1.00    Years: 13.00    Types: Cigarettes  . Smokeless tobacco: Never Used  . Alcohol use No     Allergies   Lexapro [escitalopram] and Codeine   Review of Systems  Review of Systems  Constitutional: Negative.   HENT: Negative.   Respiratory: Negative.   Cardiovascular: Negative.   Gastrointestinal: Negative.   Musculoskeletal: Negative.   Skin: Negative.   Neurological: Positive for light-headedness.  Psychiatric/Behavioral: Negative.   All other systems reviewed and are negative.  Physical Exam Updated Vital Signs BP 137/88 (BP Location: Left Arm)   Pulse 91   Temp 98 F (36.7 C) (Oral)   Resp 18   Ht 5\' 4"  (1.626 m)   Wt 190 lb (86.2 kg)   LMP 06/29/2016 (Exact Date)   SpO2 97%   BMI 32.61 kg/m   Physical Exam    Constitutional: She is oriented to person, place, and time. She appears well-developed and well-nourished.  HENT:  Head: Normocephalic and atraumatic.  Eyes: Conjunctivae are normal. Pupils are equal, round, and reactive to light.  Neck: Neck supple. No tracheal deviation present. No thyromegaly present.  Cardiovascular: Normal rate and regular rhythm.   No murmur heard. Pulmonary/Chest: Effort normal and breath sounds normal.  Abdominal: Soft. Bowel sounds are normal. She exhibits no distension. There is no tenderness.  Musculoskeletal: Normal range of motion. She exhibits no edema or tenderness.  Neurological: She is alert and oriented to person, place, and time. Coordination normal.  Gait normal becomes mildly lightheaded on standing  Skin: Skin is warm and dry. No rash noted.  Psychiatric: She has a normal mood and affect.  Nursing note and vitals reviewed.    ED Treatments / Results  DIAGNOSTIC STUDIES:  Oxygen Saturation is 97% on RA, normal by my interpretation.    COORDINATION OF CARE:  7:17 PM Will order sodium chloride bolus. Discussed treatment plan with pt at bedside and pt agreed to plan.  Labs (all labs ordered are listed, but only abnormal results are displayed) Labs Reviewed - No data to display  EKG  EKG Interpretation  Date/Time:  Saturday July 13 2016 19:08:18 EDT Ventricular Rate:  88 PR Interval:    QRS Duration: 82 QT Interval:  349 QTC Calculation: 423 R Axis:   44 Text Interpretation:  Sinus rhythm No significant change since last tracing Confirmed by Ethelda ChickJACUBOWITZ  MD, Denisia Harpole [phone]([id number]) on 07/13/2016 7:32:24 PM       Radiology No results found.  Procedures Procedures (including critical care time)  Medications Ordered in ED Medications - No data to display  Results for orders placed or performed during the hospital encounter of 07/13/16  Comprehensive metabolic panel  Result Value Ref Range   Sodium 134 (L) 135 - 145 mmol/L   Potassium  3.2 (L) 3.5 - 5.1 mmol/L   Chloride 104 101 - 111 mmol/L   CO2 23 22 - 32 mmol/L   Glucose, Bld 171 (H) 65 - 99 mg/dL   BUN 13 6 - 20 mg/dL   Creatinine, Ser 6.040.70 0.44 - 1.00 mg/dL   Calcium 8.6 (L) 8.9 - 10.3 mg/dL   Total Protein 6.9 6.5 - 8.1 g/dL   Albumin 3.5 3.5 - 5.0 g/dL   AST 26 15 - 41 U/L   ALT 19 14 - 54 U/L   Alkaline Phosphatase 66 38 - 126 U/L   Total Bilirubin 0.3 0.3 - 1.2 mg/dL   GFR calc non Af Amer >60 >60 mL/min   GFR calc Af Amer >60 >60 mL/min   Anion gap 7 5 - 15  CBC with Differential/Platelet  Result Value Ref Range   WBC 11.5 (H) 4.0 - 10.5 K/uL   RBC 4.72 3.87 -  5.11 MIL/uL   Hemoglobin 13.7 12.0 - 15.0 g/dL   HCT 16.1 09.6 - 04.5 %   MCV 85.8 78.0 - 100.0 fL   MCH 29.0 26.0 - 34.0 pg   MCHC 33.8 30.0 - 36.0 g/dL   RDW 40.9 81.1 - 91.4 %   Platelets 251 150 - 400 K/uL   Neutrophils Relative % 65 %   Neutro Abs 7.5 1.7 - 7.7 K/uL   Lymphocytes Relative 29 %   Lymphs Abs 3.3 0.7 - 4.0 K/uL   Monocytes Relative 5 %   Monocytes Absolute 0.5 0.1 - 1.0 K/uL   Eosinophils Relative 1 %   Eosinophils Absolute 0.1 0.0 - 0.7 K/uL   Basophils Relative 0 %   Basophils Absolute 0.0 0.0 - 0.1 K/uL  Urinalysis, Routine w reflex microscopic (not at [facility])  Result Value Ref Range   Color, Urine YELLOW YELLOW   APPearance CLOUDY (A) CLEAR   Specific Gravity, Urine 1.023 1.005 - 1.030   pH 6.0 5.0 - 8.0   Glucose, UA NEGATIVE NEGATIVE mg/dL   Hgb urine dipstick NEGATIVE NEGATIVE   Bilirubin Urine NEGATIVE NEGATIVE   Ketones, ur NEGATIVE NEGATIVE mg/dL   Protein, ur NEGATIVE NEGATIVE mg/dL   Nitrite NEGATIVE NEGATIVE   Leukocytes, UA MODERATE (A) NEGATIVE  Pregnancy, urine  Result Value Ref Range   Preg Test, Ur NEGATIVE NEGATIVE  Urine microscopic-add on  Result Value Ref Range   Squamous Epithelial / LPF 6-30 (A) NONE SEEN   WBC, UA 6-30 0 - 5 WBC/hpf   RBC / HPF 0-5 0 - 5 RBC/hpf   Bacteria, UA MANY (A) NONE SEEN   No results found. Initial  Impression / Assessment and Plan / ED Course  I have reviewed the triage vital signs and the nursing notes.  Pertinent labs & imaging results that were available during my care of the patient were reviewed by me and considered in my medical decision making (see chart for details).  Clinical Course    8:55 PM feels improved after treatment with intravenous fluids. Patient is alert and ambulatory Lightheadedness and syncope likely secondary to dehydration. Plan encourage oral hydration. Referral [place] [facility]. Lab work reviewed. Patient has no urinary symptoms and has been told that she has abnormal urinalysis in the past. Will not culture urine or treat for UTI Final Clinical Impressions(s) / ED Diagnoses  Diagnosis #1 syncope #2 hypokalemia Final diagnoses:  None      New Prescriptions New Prescriptions   No medications on file     Doug Sou, MD 07/13/16 2100

## 2017-01-27 ENCOUNTER — Encounter (HOSPITAL_BASED_OUTPATIENT_CLINIC_OR_DEPARTMENT_OTHER): Payer: Self-pay

## 2017-01-27 DIAGNOSIS — Y999 Unspecified external cause status: Secondary | ICD-10-CM | POA: Insufficient documentation

## 2017-01-27 DIAGNOSIS — Y929 Unspecified place or not applicable: Secondary | ICD-10-CM | POA: Insufficient documentation

## 2017-01-27 DIAGNOSIS — F17210 Nicotine dependence, cigarettes, uncomplicated: Secondary | ICD-10-CM | POA: Insufficient documentation

## 2017-01-27 DIAGNOSIS — T23271A Burn of second degree of right wrist, initial encounter: Secondary | ICD-10-CM | POA: Insufficient documentation

## 2017-01-27 DIAGNOSIS — X58XXXA Exposure to other specified factors, initial encounter: Secondary | ICD-10-CM | POA: Insufficient documentation

## 2017-01-27 DIAGNOSIS — Z5321 Procedure and treatment not carried out due to patient leaving prior to being seen by health care provider: Secondary | ICD-10-CM | POA: Insufficient documentation

## 2017-01-27 DIAGNOSIS — Y939 Activity, unspecified: Secondary | ICD-10-CM | POA: Insufficient documentation
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
## 2017-01-27 NOTE — ED Triage Notes (Signed)
No answer from ED WR when called for repeat VS

## 2017-01-28 ENCOUNTER — Emergency Department (HOSPITAL_BASED_OUTPATIENT_CLINIC_OR_DEPARTMENT_OTHER)
Admission: EM | Admit: 2017-01-28 | Discharge: 2017-01-28 | Discharge status: Against Medical Advice | Payer: Self-pay | Attending: Emergency Medicine | Admitting: Emergency Medicine

## 2017-01-28 DIAGNOSIS — T300 Burn of unspecified body region, unspecified degree: Secondary | ICD-10-CM

## 2017-01-28 NOTE — ED Notes (Signed)
No answer after 2 calls

## 2017-11-10 ENCOUNTER — Ambulatory Visit: Payer: 59 | Admitting: Family Medicine

## 2017-11-10 ENCOUNTER — Encounter: Payer: Self-pay | Admitting: Family Medicine

## 2017-11-10 ENCOUNTER — Telehealth: Payer: Self-pay | Admitting: Family Medicine

## 2017-11-10 VITALS — BP 122/80 | HR 90 | Temp 98.8°F | Resp 12 | Ht 64.0 in | Wt 210.4 lb

## 2017-11-10 DIAGNOSIS — F17200 Nicotine dependence, unspecified, uncomplicated: Secondary | ICD-10-CM | POA: Diagnosis not present

## 2017-11-10 DIAGNOSIS — Z6836 Body mass index (BMI) 36.0-36.9, adult: Secondary | ICD-10-CM

## 2017-11-10 DIAGNOSIS — R739 Hyperglycemia, unspecified: Secondary | ICD-10-CM | POA: Diagnosis not present

## 2017-11-10 DIAGNOSIS — J309 Allergic rhinitis, unspecified: Secondary | ICD-10-CM | POA: Diagnosis not present

## 2017-11-10 DIAGNOSIS — E669 Obesity, unspecified: Secondary | ICD-10-CM | POA: Insufficient documentation

## 2017-11-10 DIAGNOSIS — R0683 Snoring: Secondary | ICD-10-CM | POA: Diagnosis not present

## 2017-11-10 DIAGNOSIS — E6609 Other obesity due to excess calories: Secondary | ICD-10-CM | POA: Diagnosis not present

## 2017-11-10 DIAGNOSIS — R5383 Other fatigue: Secondary | ICD-10-CM | POA: Diagnosis not present

## 2017-11-10 LAB — POCT GLYCOSYLATED HEMOGLOBIN (HGB A1C): HEMOGLOBIN A1C: 5.9

## 2017-11-10 MED ORDER — BUPROPION HCL ER (SR) 150 MG PO TB12
150.0000 mg | ORAL_TABLET | Freq: Two times a day (BID) | ORAL | 2 refills | Status: DC
Start: 2017-11-10 — End: 2018-02-11

## 2017-11-10 NOTE — Progress Notes (Signed)
HPI:   [name] is a 42 y.o. female, who is here today to establish care.  Former PCP: Dr Lyn Hollingshead Last preventive routine visit: 12 years ago.  Chronic medical problems: ADD,depression,gestational diabetes, asthma, and migraines among some.   Concerns today: Snoring.  Loud snoring, getting worse for the past 2 years. It is affecting her husband's sleep and he is complaining. She thinks the heat from fireplace makes problem worse.  + Fatigue. She has not noted morning headache.   Nasal congestion and rhinorrhea. Sometimes dry nasal mucosa and bloody mucus.  Noted GLU 171 in 06/2016.   Lab Results  Component Value Date   CREATININE 0.70 07/13/2016   BUN 13 07/13/2016   NA 134 (L) 07/13/2016   K 3.2 (L) 07/13/2016   CL 104 07/13/2016   CO2 23 07/13/2016    She has not been consistent with a healthy diet and does not exercise regularly. She has noted wt gained in the past 1-2 years. Planning on starting exercising.  + Smoker. She would like to quit smoking, has not been able to do so. She quit before for a few years.  She has not tried OTC products for smoking cessation.   Review of Systems  Constitutional: Positive for fatigue. Negative for activity change, appetite change and fever.  HENT: Positive for congestion, postnasal drip and rhinorrhea. Negative for mouth sores, sore throat, trouble swallowing and voice change.   Eyes: Negative for redness and visual disturbance.  Respiratory: Negative for cough, shortness of breath and wheezing.   Cardiovascular: Negative for chest pain, palpitations and leg swelling.  Gastrointestinal: Negative for abdominal pain, nausea and vomiting.       Negative for changes in bowel habits.  Endocrine: Negative for cold intolerance, heat intolerance, polydipsia, polyphagia and polyuria.  Genitourinary: Negative for decreased urine volume, difficulty urinating, dysuria and hematuria.  Allergic/Immunologic:  Positive for environmental allergies.  Neurological: Negative for syncope, weakness and headaches.  Psychiatric/Behavioral: Negative for confusion and sleep disturbance.      No current outpatient medications on file prior to visit.   No current facility-administered medications on file prior to visit.      Past Medical History:  Diagnosis Date  . ADHD (attention deficit hyperactivity disorder)   . Anxiety   . Asthma   . Depression   . Fatigue   . Gestational diabetes    pregnancy  . Headache   . History of gestational diabetes   . Obsessive-compulsive disorder   . Sinusitis    Allergies  Allergen Reactions  . Lexapro [Escitalopram] Hives  . Penicillins Nausea And Vomiting  . Benadryl [Diphenhydramine] Rash  . Codeine Itching    Family History  Problem Relation Age of Onset  . Dementia Maternal Aunt   . Seizures Maternal Aunt   . ADD / ADHD Maternal Uncle   . Drug abuse Maternal Uncle   . Alzheimer's disease Mother     Social History   Socioeconomic History  . Marital status: Married    Spouse name: None  . Number of children: None  . Years of education: None  . Highest education level: None  Social Needs  . Financial resource strain: None  . Food insecurity - worry: None  . Food insecurity - inability: None  . Transportation needs - medical: None  . Transportation needs - non-medical: None  Occupational History  . None  Tobacco Use  . Smoking status: Current Every Day Smoker  Packs/day: 1.00    Years: 13.00    Pack years: 13.00    Types: Cigarettes  . Smokeless tobacco: Never Used  Substance and Sexual Activity  . Alcohol use: No  . Drug use: No  . Sexual activity: Yes    Birth control/protection: Surgical  Other Topics Concern  . None  Social History Narrative  . None    Vitals:   11/10/17 1357  BP: 122/80  Pulse: 90  Resp: 12  Temp: 98.8 F (37.1 C)  SpO2: 98%    Body mass index is 36.11 kg/m.   Physical Exam  Nursing  note and vitals reviewed. Constitutional: She is oriented to person, place, and time. She appears well-developed. No distress.  HENT:  Head: Normocephalic and atraumatic.  Mouth/Throat: Oropharynx is clear and moist and mucous membranes are normal.  Eyes: Conjunctivae are normal. Pupils are equal, round, and reactive to light.  Neck: No tracheal deviation present. No thyroid mass and no thyromegaly present.  Cardiovascular: Normal rate and regular rhythm.  No murmur heard. Pulses:      Dorsalis pedis pulses are 2+ on the right side, and 2+ on the left side.  Respiratory: Effort normal and breath sounds normal. No respiratory distress.  GI: Soft. She exhibits no mass. There is no hepatomegaly. There is no tenderness.  Musculoskeletal: She exhibits no edema or tenderness.  Lymphadenopathy:    She has no cervical adenopathy.  Neurological: She is alert and oriented to person, place, and time. She has normal strength. Coordination and gait normal.  Skin: Skin is warm. No erythema.  Psychiatric: She has a normal mood and affect.  Well groomed, good eye contact.    ASSESSMENT AND PLAN:   [name] was seen today for establish care.  Diagnoses and all orders for this visit:  Tobacco use disorder  Adverse effects of tobacco use and benefits of smoking cessation discussed. After discussion of a few pharmacologic treatment options,she agrees with trying Wellbutrin, starting once daily x 1 week then bid.  -     buPROPion (WELLBUTRIN SR) 150 MG 12 hr tablet; Take 1 tablet (150 mg total) by mouth 2 (two) times daily.  Fatigue, unspecified type  We discussed possible etiologies: Systemic illness, immunologic,endocrinology,sleep disorder, psychiatric/psychologic, infectious,medications side effects, and idiopathic. Examination today does not suggest a serious process. Healthy diet and regular physical activity may help. ? OSA. Referral to pulmonologist placed.  She does not want lab  work done today, agrees with A1C finger stick.  -     Ambulatory referral to Pulmonology  Hyperglycemia  Lab Results  Component Value Date   HGBA1C 5.9 11/10/2017    -     POCT glycosylated hemoglobin (Hb A1C)  Snoring  Getting louder. Wt loss and Flonase nasal spray may help. Avoid identified trigger factors. ? OSA.  -     Ambulatory referral to Pulmonology  Class 2 obesity due to excess calories without serious comorbidity with body mass index (BMI) of 36.0 to 36.9 in adult  We discussed benefits of wt loss as well as adverse effects of obesity. Consistency with healthy diet and physical activity recommended. Wellbutrin may also help with wt loss,side effects discussed.  -     buPROPion (WELLBUTRIN SR) 150 MG 12 hr tablet; Take 1 tablet (150 mg total) by mouth 2 (two) times daily.  Allergic rhinitis, unspecified seasonality, unspecified trigger  This could aggravate snoring. Recommend Flonase nasal spray at bedtime.  -     fluticasone (FLONASE)  50 MCG/ACT nasal spray; Place 1 spray into both nostrils 2 (two) times daily.     She would like to follow around her birthday and at the same time have her physical done.     Winnifred Dufford G. [name], MD  [facility]. [place] office.

## 2017-11-10 NOTE — Telephone Encounter (Signed)
Copied from CRM [phone]#[id number]. Topic: Quick Communication - See Telephone Encounter >> Nov 10, 2017  3:52 PM Terisa Starraylor, Brittany L wrote: CRM for notification. See Telephone encounter for:   11/10/17.  Pt went to the pharmacy and said they do not have her nasal spray. She just had an appt with Dr [name].   [id number] [address], [place] - [address]

## 2017-11-10 NOTE — Patient Instructions (Addendum)
A few things to remember from today's visit:   Hyperglycemia - Plan: POCT glycosylated hemoglobin (Hb A1C)  Fatigue, unspecified type - Plan: Ambulatory referral to Pulmonology  Snoring - Plan: Ambulatory referral to Pulmonology  Tobacco use disorder - Plan: buPROPion (WELLBUTRIN SR) 150 MG 12 hr tablet  Start Wellbutrin daily for a week then 2 times per day.  Let me know how you are doing in about 6-8 weeks. Lab Results  Component Value Date   HGBA1C 5.9 11/10/2017     Please be sure medication list is accurate. If a new problem present, please set up appointment sooner than planned today.

## 2017-11-11 MED ORDER — FLUTICASONE PROPIONATE 50 MCG/ACT NA SUSP: 1.0000 | Freq: Two times a day (BID) | NASAL | 3 refills | Status: DC

## 2017-11-11 NOTE — Telephone Encounter (Signed)
Rx for Flonase sent. Thanks, BJ

## 2017-11-11 NOTE — Telephone Encounter (Signed)
Pt calling checking on status of nasal spray

## 2017-11-13 ENCOUNTER — Encounter: Payer: Self-pay | Admitting: Family Medicine

## 2018-02-11 ENCOUNTER — Other Ambulatory Visit: Payer: Self-pay | Admitting: Family Medicine

## 2018-02-11 DIAGNOSIS — Z6836 Body mass index (BMI) 36.0-36.9, adult: Secondary | ICD-10-CM

## 2018-02-11 DIAGNOSIS — E6609 Other obesity due to excess calories: Secondary | ICD-10-CM

## 2018-02-11 DIAGNOSIS — F17200 Nicotine dependence, unspecified, uncomplicated: Secondary | ICD-10-CM

## 2018-02-11 DIAGNOSIS — J343 Hypertrophy of nasal turbinates: Secondary | ICD-10-CM | POA: Insufficient documentation

## 2018-02-25 HISTORY — PX: RHINOPLASTY: SUR1284

## 2018-03-08 ENCOUNTER — Encounter (HOSPITAL_BASED_OUTPATIENT_CLINIC_OR_DEPARTMENT_OTHER): Payer: Self-pay | Admitting: Emergency Medicine

## 2018-03-08 ENCOUNTER — Emergency Department (HOSPITAL_BASED_OUTPATIENT_CLINIC_OR_DEPARTMENT_OTHER)
Admission: EM | Admit: 2018-03-08 | Discharge: 2018-03-08 | Disposition: A | Discharge status: Home / Self Care | Payer: 59 | Attending: Emergency Medicine | Admitting: Emergency Medicine

## 2018-03-08 ENCOUNTER — Emergency Department (HOSPITAL_BASED_OUTPATIENT_CLINIC_OR_DEPARTMENT_OTHER): Payer: 59

## 2018-03-08 ENCOUNTER — Other Ambulatory Visit: Payer: Self-pay

## 2018-03-08 DIAGNOSIS — F17210 Nicotine dependence, cigarettes, uncomplicated: Secondary | ICD-10-CM | POA: Insufficient documentation

## 2018-03-08 DIAGNOSIS — W1849XA Other slipping, tripping and stumbling without falling, initial encounter: Secondary | ICD-10-CM | POA: Insufficient documentation

## 2018-03-08 DIAGNOSIS — J45909 Unspecified asthma, uncomplicated: Secondary | ICD-10-CM | POA: Diagnosis not present

## 2018-03-08 DIAGNOSIS — Y939 Activity, unspecified: Secondary | ICD-10-CM | POA: Insufficient documentation

## 2018-03-08 DIAGNOSIS — Y929 Unspecified place or not applicable: Secondary | ICD-10-CM | POA: Insufficient documentation

## 2018-03-08 DIAGNOSIS — S93402A Sprain of unspecified ligament of left ankle, initial encounter: Secondary | ICD-10-CM | POA: Diagnosis not present

## 2018-03-08 DIAGNOSIS — Z79899 Other long term (current) drug therapy: Secondary | ICD-10-CM | POA: Diagnosis not present

## 2018-03-08 DIAGNOSIS — Y999 Unspecified external cause status: Secondary | ICD-10-CM | POA: Insufficient documentation

## 2018-03-08 DIAGNOSIS — S99912A Unspecified injury of left ankle, initial encounter: Secondary | ICD-10-CM | POA: Diagnosis present

## 2018-03-08 MED ORDER — IBUPROFEN 400 MG PO TABS
600.0000 mg | ORAL_TABLET | Freq: Once | ORAL | Status: AC
  Administered 2018-03-08: 600 mg via ORAL
  Filled 2018-03-08: qty 1

## 2018-03-08 MED ORDER — IBUPROFEN 600 MG PO TABS: 600.0000 mg | ORAL_TABLET | Freq: Four times a day (QID) | ORAL | 0 refills | Status: DC | PRN

## 2018-03-08 NOTE — ED Triage Notes (Signed)
Patient states that she fell and hurt her left ankle. She states that she felt her ankle pop

## 2018-03-08 NOTE — ED Provider Notes (Signed)
[facility] EMERGENCY DEPARTMENT Provider Note   CSN: [id number] Arrival date & time: 03/08/18  1831     History   Chief Complaint Chief Complaint  Patient presents with  . Fall    HPI Barbara Barr is a 42 y.o. female.  The history is provided by the patient and medical records. No language interpreter was used.  Fall    Barbara Barr is a 42 y.o. female who presents to the Emergency Department complaining of acute onset of left ankle pain which began just prior to arrival.  Patient states that she was walking when her ankle twisted and she felt a pop.  Immediately noticed pain and some slight swelling.  No medications taken prior to arrival for symptoms.  No numbness or tingling.  She does have an orthopedic doctor in [place] whom she can follow-up with.  Past Medical History:  Diagnosis Date  . ADHD (attention deficit hyperactivity disorder)   . Anxiety   . Asthma   . Depression   . Fatigue   . Gestational diabetes    pregnancy  . Headache   . History of gestational diabetes   . Obsessive-compulsive disorder   . Sinusitis     Patient Active Problem List   Diagnosis Date Noted  . Class 2 obesity with body mass index (BMI) of 36.0 to 36.9 in adult 11/10/2017  . Allergic rhinitis 11/10/2017    Past Surgical History:  Procedure Laterality Date  . CHOLECYSTECTOMY  2003  . RHINOPLASTY    . TUBAL LIGATION  2006     OB History   None      Home Medications    Prior to Admission medications   Medication Sig Start Date End Date Taking? Authorizing Provider  buPROPion [facility] SR) 150 MG 12 hr tablet TAKE 1 TABLET BY MOUTH TWICE A DAY 02/11/18   [place], [name], MD  fluticasone [facility]) 50 MCG/ACT nasal spray Place 1 spray into both nostrils 2 (two) times daily. 11/11/17   [place], [name], MD  ibuprofen (ADVIL,MOTRIN) 600 MG tablet Take 1 tablet (600 mg total) by mouth every 6 (six) hours as needed. 03/08/18   Kyleena Scheirer, Chase Picket, PA-C     Family History Family History  Problem Relation Age of Onset  . Dementia Maternal Aunt   . Seizures Maternal Aunt   . ADD / ADHD Maternal Uncle   . Drug abuse Maternal Uncle   . Alzheimer's disease Mother     Social History Social History   Tobacco Use  . Smoking status: Current Every Day Smoker    Packs/day: 1.00    Years: 13.00    Pack years: 13.00    Types: Cigarettes  . Smokeless tobacco: Never Used  Substance Use Topics  . Alcohol use: No  . Drug use: No     Allergies   Lexapro [escitalopram]; Penicillins; Benadryl [diphenhydramine]; and Codeine   Review of Systems Review of Systems  Musculoskeletal: Positive for arthralgias and myalgias.  Skin: Negative for color change and wound.  Neurological: Negative for weakness and numbness.     Physical Exam Updated Vital Signs BP 116/71 (BP Location: Right Arm)   Pulse 90   Temp 98.6 F (37 C) (Oral)   Resp 19   Ht  (1.651 m)   Wt 89.4 kg (197 lb)   LMP 03/04/2018   SpO2 99%   BMI 32.78 kg/m   Physical Exam  Constitutional: She appears well-developed and well-nourished. No distress.  HENT:  Head: Normocephalic and atraumatic.  Neck: Neck supple.  Cardiovascular: Normal rate, regular rhythm and normal heart sounds.  No murmur heard. Pulmonary/Chest: Effort normal and breath sounds normal. No respiratory distress. She has no wheezes. She has no rales.  Musculoskeletal:  Left ankle with tenderness to palpation to and around the lateral malleolus. + Swelling.  Decreased range of motion likely secondary to pain. No erythema, ecchymosis, or deformity appreciated. No break in skin. No pain to fifth metatarsal area or navicular region. Achilles intact. Good pedal pulse and cap refill of toes.Sensation grossly intact.  Neurological: She is alert.  Skin: Skin is warm and dry.  Nursing note and vitals reviewed.    ED Treatments / Results  Labs (all labs ordered are listed, but only abnormal results  are displayed) Labs Reviewed - No data to display  EKG None  Radiology Dg Ankle Complete Left  Result Date: 03/08/2018 CLINICAL DATA:  Twisting injury stepping off porch, initial encounter EXAM: LEFT ANKLE COMPLETE - 3+ VIEW COMPARISON:  None. FINDINGS: There is no evidence of fracture, dislocation, or joint effusion. There is no evidence of arthropathy or other focal bone abnormality. Soft tissues are unremarkable. IMPRESSION: No acute abnormality noted. Electronically Signed   By: Alcide Clever M.D.   On: 03/08/2018 19:24    Procedures Procedures (including critical care time)  Medications Ordered in ED Medications  ibuprofen (ADVIL,MOTRIN) tablet 600 mg (has no administration in time range)     Initial Impression / Assessment and Plan / ED Course  I have reviewed the triage vital signs and the nursing notes.  Pertinent labs & imaging results that were available during my care of the patient were reviewed by me and considered in my medical decision making (see chart for details).    Barbara Barr is a 42 y.o. female who presents to ED for ankle pain after inversion injury just prior to arrival. LE NVI. Exam c/w ankle sprain. X-ray negative for acute injury. ASO brace and crutches provided in ED. Home care instructions including RICE and NSAID's discussed. Follow up with her orthopedist if symptoms not improving in 1 week. All questions answered.   Final Clinical Impressions(s) / ED Diagnoses   Final diagnoses:  Sprain of left ankle, unspecified ligament, initial encounter    ED Discharge Orders        Ordered    ibuprofen (ADVIL,MOTRIN) 600 MG tablet  Every 6 hours PRN     03/08/18 1959       Peace Noyes, Chase Picket, PA-C 03/08/18 2005    Benjiman Core, MD 03/08/18 2357

## 2018-03-08 NOTE — Discharge Instructions (Signed)
Ibuprofen as needed for pain. You can alternate with Tylenol as needed for additional pain relief as needed.  Ice and alleviate for additional pain relief. If your symptoms persist without improvement in one week, please follow up with your orthopedic doctor.    TREATMENT  Rest, ice, elevation, and compression are the basic modes of treatment.    HOME CARE INSTRUCTIONS  Apply ice to the sore area for 15 to 20 minutes, 3 to 4 times per day. Do this while you are awake for the first 2 days. This can be stopped when the swelling goes away. Put the ice in a plastic bag and place a towel between the bag of ice and your skin.  Keep your leg elevated when possible to lessen swelling.  You may take off your ankle stabilizer at night and to take a shower or bath. Wiggle your toes several times per day if you are able.  ACTIVITY:            - Weight bearing as tolerated - if you can do it, do it. If it hurts, stop.             - Exercises should be limited to pain free range of motion            - Can start mobilization by tracing the alphabet with your foot in the air.       SEEK MEDICAL CARE IF:  You have an increase in bruising, swelling, or pain.  Your toes feel cold.  Pain relief is not achieved with medications.  EMERGENCY:: Your toes are numb or blue or you have severe pain.   MAKE SURE YOU:  Understand these instructions.  Will watch your condition.  Will get help right away if you are not doing well or get worse

## 2018-03-09 NOTE — Progress Notes (Signed)
HPI:   Barbara Barr is a 42 y.o. female, who is here today for her routine physical.  Last CPE: About 12 years ago.  Regular exercise 3 or more time per week: Not able to do so for the past 2 weeks due to left ankle injury. Following a healthy diet: Trying to do better. She lives with husband and 3 children.   Pap smear 12 years ago. Hx of abnormal pap smears: Yes, s/p conization. Hx of STD's No  Immunization History  Administered Date(s) Administered  . Tdap 09/11/2015    Mammogram: N/A Colonoscopy: N/A DEXA: N/A  + Smoker,trying to quit. She is on Wellbutrin.  She has appointment for sleep study in 03/2018. She is following with ENT, she had sinus surgery about 2 weeks ago.  According to patient, her snoring is most likely related to hypertrophic tonsils, if sleep study is negative, it was recommended to undergoing tonsillectomy.  On 03/08/2018 she sprained her left ankle.  She is supposed to schedule appointment with orthopedist, she does not need a referral to do so.  Concerns today: She would like pharmacologic treatment for weight loss.    Review of Systems  Constitutional: Positive for activity change and fatigue. Negative for appetite change, chills and fever.  HENT: Negative for dental problem, hearing loss, mouth sores, sore throat, trouble swallowing and voice change.   Eyes: Negative for photophobia and visual disturbance.  Respiratory: Negative for cough, shortness of breath and wheezing.   Cardiovascular: Negative for chest pain and leg swelling.  Gastrointestinal: Negative for abdominal pain, nausea and vomiting.       No changes in bowel habits.  Endocrine: Negative for cold intolerance, heat intolerance, polydipsia, polyphagia and polyuria.  Genitourinary: Negative for decreased urine volume, dysuria, hematuria, vaginal bleeding and vaginal discharge.  Musculoskeletal: Positive for arthralgias and gait problem. Negative for neck pain.    Skin: Negative for color change and rash.  Allergic/Immunologic: Positive for environmental allergies.  Neurological: Negative for syncope, weakness and headaches.  Hematological: Negative for adenopathy. Does not bruise/bleed easily.  Psychiatric/Behavioral: Negative for confusion and sleep disturbance. The patient is not nervous/anxious.   All other systems reviewed and are negative.   Current Outpatient Medications on File Prior to Visit  Medication Sig Dispense Refill  . buPROPion (WELLBUTRIN SR) 150 MG 12 hr tablet TAKE 1 TABLET BY MOUTH TWICE A DAY 60 tablet 1  . fluticasone (FLONASE) 50 MCG/ACT nasal spray Place 1 spray into both nostrils 2 (two) times daily. 16 g 3  . ibuprofen (ADVIL,MOTRIN) 600 MG tablet Take 1 tablet (600 mg total) by mouth every 6 (six) hours as needed. 30 tablet 0   No current facility-administered medications on file prior to visit.      Past Medical History:  Diagnosis Date  . ADHD (attention deficit hyperactivity disorder)   . Anxiety   . Asthma   . Depression   . Fatigue   . Gestational diabetes    pregnancy  . Headache   . History of gestational diabetes   . Obsessive-compulsive disorder   . Sinusitis     Past Surgical History:  Procedure Laterality Date  . CHOLECYSTECTOMY  2003  . RHINOPLASTY    . TUBAL LIGATION  2006    Allergies  Allergen Reactions  . Lexapro [Escitalopram] Hives  . Penicillins Nausea And Vomiting  . Benadryl [Diphenhydramine] Rash  . Codeine Itching    Family History  Problem Relation Age of Onset  .  Dementia Maternal Aunt   . Seizures Maternal Aunt   . ADD / ADHD Maternal Uncle   . Drug abuse Maternal Uncle   . Alzheimer's disease Mother     Social History   Socioeconomic History  . Marital status: Married    Spouse name: Not on file  . Number of children: Not on file  . Years of education: Not on file  . Highest education level: Not on file  Occupational History  . Not on file  Social Needs   . Financial resource strain: Not on file  . Food insecurity:    Worry: Not on file    Inability: Not on file  . Transportation needs:    Medical: Not on file    Non-medical: Not on file  Tobacco Use  . Smoking status: Current Every Day Smoker    Packs/day: 1.00    Years: 13.00    Pack years: 13.00    Types: Cigarettes  . Smokeless tobacco: Never Used  Substance and Sexual Activity  . Alcohol use: No  . Drug use: No  . Sexual activity: Yes    Birth control/protection: Surgical  Lifestyle  . Physical activity:    Days per week: Not on file    Minutes per session: Not on file  . Stress: Not on file  Relationships  . Social connections:    Talks on phone: Not on file    Gets together: Not on file    Attends religious service: Not on file    Active member of club or organization: Not on file    Attends meetings of clubs or organizations: Not on file    Relationship status: Not on file  Other Topics Concern  . Not on file  Social History Narrative  . Not on file     Vitals:   03/10/18 0757  BP: 126/82  Pulse: 100  Resp: 12  Temp: 98.5 F (36.9 C)  SpO2: 98%   Body mass index is 34.86 kg/m.   Wt Readings from Last 3 Encounters:  03/10/18 209 lb 8 oz (95 kg)  03/08/18 197 lb (89.4 kg)  11/10/17 210 lb 6 oz (95.4 kg)    Physical Exam  Nursing note and vitals reviewed. Constitutional: She is oriented to person, place, and time. She appears well-developed. No distress.  HENT:  Head: Normocephalic and atraumatic.  Right Ear: Hearing, tympanic membrane, external ear and ear canal normal.  Left Ear: Hearing, tympanic membrane, external ear and ear canal normal.  Mouth/Throat: Uvula is midline, oropharynx is clear and moist and mucous membranes are normal.  Eyes: Pupils are equal, round, and reactive to light. Conjunctivae and EOM are normal.  Neck: No tracheal deviation present. No thyroid mass and no thyromegaly present.  Cardiovascular: Normal rate and  regular rhythm.  No murmur heard. Pulses:      Dorsalis pedis pulses are 2+ on the right side, and 2+ on the left side.  Respiratory: Effort normal and breath sounds normal. No respiratory distress. No breast swelling or tenderness.  GI: Soft. She exhibits no mass. There is no hepatomegaly. There is no tenderness.  Genitourinary: There is no rash, tenderness or lesion on the right labia. There is no rash, tenderness or lesion on the left labia. Uterus is not enlarged and not tender. Cervix exhibits no motion tenderness, no discharge and no friability. Right adnexum displays no mass, no tenderness and no fullness. Left adnexum displays no mass, no tenderness and no fullness. No erythema  or bleeding in the vagina. No vaginal discharge found.  Genitourinary Comments: Breast: No masses, nipple discharge, or skin abnormalities. Pap smear collected.  Musculoskeletal: She exhibits no edema.  No major deformity or signs of synovitis appreciated. Left ankle brace.   Lymphadenopathy:    She has no cervical adenopathy.    She has no axillary adenopathy.       Right: No inguinal and no supraclavicular adenopathy present.       Left: No inguinal and no supraclavicular adenopathy present.  Neurological: She is alert and oriented to person, place, and time. She has normal strength. No cranial nerve deficit. Coordination normal.  Reflex Scores:      Bicep reflexes are 2+ on the right side and 2+ on the left side.      Patellar reflexes are 2+ on the right side and 2+ on the left side. Skin: Skin is warm. No rash noted. No erythema.  Psychiatric: She has a normal mood and affect. Her speech is normal.  Well groomed, good eye contact.      ASSESSMENT AND PLAN:  Ms. Jannet Calip was here today annual physical examination.   Orders Placed This Encounter  Procedures  . Mammogram Digital Screening  . Lipid panel  . Basic metabolic panel  . Hemoglobin A1c  . Amb Ref to Medical Weight Management    Lab Results  Component Value Date   CHOL 168 06/13/2015   HDL 37 (L) 06/13/2015   LDLCALC 111 06/13/2015   TRIG 99 06/13/2015   CHOLHDL 4.5 06/13/2015   Lab Results  Component Value Date   HGBA1C 5.9 11/10/2017   Lab Results  Component Value Date   CREATININE 0.70 07/13/2016   BUN 13 07/13/2016   NA 134 (L) 07/13/2016   K 3.2 (L) 07/13/2016   CL 104 07/13/2016   CO2 23 07/13/2016     Routine general medical examination at a health care facility  We discussed the importance of regular physical activity and healthy diet for prevention of chronic illness and/or complications. Preventive guidelines reviewed. Vaccination up to date. Pap smear collected. Instructed to arrange mammogram after her 42 years old birthday, order placed. Continue working on smoking cessation.  Next CPE in a year.  Diabetes mellitus screening -     Basic metabolic panel -     Hemoglobin A1c  Screening for lipid disorders -     Lipid panel  Breast cancer screening -     Mammogram Digital Screening; Future  Cervical cancer screening -     Cytology - PAP ([place])  Class 1 obesity due to excess calories without serious comorbidity with body mass index (BMI) of 34.0 to 34.9 in adult  Continue healthy diet. Currently she cannot exercise regularly because left ankle sprain. Referral for medical weight loss management was placed.  -     Amb Ref to Medical Weight Management      Return in 1 year (on 03/11/2019) for CPE.      [name]. [place], MD  [facility]. [place] office.

## 2018-03-10 ENCOUNTER — Ambulatory Visit (INDEPENDENT_AMBULATORY_CARE_PROVIDER_SITE_OTHER): Payer: 59 | Admitting: Family Medicine

## 2018-03-10 ENCOUNTER — Encounter: Payer: Self-pay | Admitting: Family Medicine

## 2018-03-10 ENCOUNTER — Other Ambulatory Visit (HOSPITAL_COMMUNITY)
Admission: RE | Admit: 2018-03-10 | Discharge: 2018-03-10 | Disposition: A | Discharge status: Home / Self Care | Payer: 59 | Source: Ambulatory Visit | Attending: Family Medicine | Admitting: Family Medicine

## 2018-03-10 VITALS — BP 126/82 | HR 100 | Temp 98.5°F | Resp 12 | Ht 65.0 in | Wt 209.5 lb

## 2018-03-10 DIAGNOSIS — E6609 Other obesity due to excess calories: Secondary | ICD-10-CM

## 2018-03-10 DIAGNOSIS — Z131 Encounter for screening for diabetes mellitus: Secondary | ICD-10-CM | POA: Diagnosis not present

## 2018-03-10 DIAGNOSIS — Z13220 Encounter for screening for lipoid disorders: Secondary | ICD-10-CM

## 2018-03-10 DIAGNOSIS — E66811 Obesity, class 1: Secondary | ICD-10-CM

## 2018-03-10 DIAGNOSIS — Z124 Encounter for screening for malignant neoplasm of cervix: Secondary | ICD-10-CM | POA: Diagnosis not present

## 2018-03-10 DIAGNOSIS — Z1231 Encounter for screening mammogram for malignant neoplasm of breast: Secondary | ICD-10-CM | POA: Diagnosis not present

## 2018-03-10 DIAGNOSIS — R87611 Atypical squamous cells cannot exclude high grade squamous intraepithelial lesion on cytologic smear of cervix (ASC-H): Secondary | ICD-10-CM

## 2018-03-10 DIAGNOSIS — Z0000 Encounter for general adult medical examination without abnormal findings: Secondary | ICD-10-CM | POA: Diagnosis not present

## 2018-03-10 DIAGNOSIS — Z1239 Encounter for other screening for malignant neoplasm of breast: Secondary | ICD-10-CM

## 2018-03-10 DIAGNOSIS — Z6834 Body mass index (BMI) 34.0-34.9, adult: Secondary | ICD-10-CM

## 2018-03-10 DIAGNOSIS — N889 Noninflammatory disorder of cervix uteri, unspecified: Secondary | ICD-10-CM | POA: Insufficient documentation

## 2018-03-10 LAB — LIPID PANEL
CHOLESTEROL: 169 mg/dL (ref 0–200)
HDL: 40 mg/dL (ref >39.00)
LDL CALC: 104 mg/dL — AB (ref 0–99)
NonHDL: 128.74
TRIGLYCERIDES: 123 mg/dL (ref 0.0–149.0)
Total CHOL/HDL Ratio: 4
VLDL: 24.6 mg/dL (ref 0.0–40.0)

## 2018-03-10 LAB — BASIC METABOLIC PANEL
BUN: 13 mg/dL (ref 6–23)
CHLORIDE: 104 meq/L (ref 96–112)
CO2: 22 mEq/L (ref 19–32)
Calcium: 8.7 mg/dL (ref 8.4–10.5)
Creatinine, Ser: 0.69 mg/dL (ref 0.40–1.20)
GFR: 100.17 mL/min (ref >60.00)
GLUCOSE: 97 mg/dL (ref 70–99)
POTASSIUM: 3.9 meq/L (ref 3.5–5.1)
Sodium: 136 mEq/L (ref 135–145)

## 2018-03-10 LAB — HEMOGLOBIN A1C: HEMOGLOBIN A1C: 6.1 % (ref 4.6–6.5)

## 2018-03-10 NOTE — Patient Instructions (Addendum)
A few things to remember from today's visit:   Routine general medical examination at a health care facility  Diabetes mellitus screening - Plan: Basic metabolic panel, Hemoglobin A1c  Screening for lipid disorders - Plan: Lipid panel  Breast cancer screening - Plan: Mammogram Digital Screening  Cervical cancer screening - Plan: Cytology - PAP ([place])  Class 2 obesity due to excess calories without serious comorbidity with body mass index (BMI) of 36.0 to 36.9 in adult - Plan: Amb Ref to Medical Weight Management  Today you have you routine preventive visit.  At least 150 minutes of moderate exercise per week, daily brisk walking for 15-30 min is a good exercise option. Healthy diet low in saturated (animal) fats and sweets and consisting of fresh fruits and vegetables, lean meats such as fish and white chicken and whole grains.  These are some of recommendations for screening depending of age and risk factors:   - Vaccines:  Tdap vaccine every 10 years.  Shingles vaccine recommended at age 43, could be given after 40 years of age but not sure about insurance coverage.   Pneumonia vaccines:  Prevnar 13 at 65 and Pneumovax at 66. Sometimes Pneumovax is giving earlier if history of smoking, lung disease,diabetes,kidney disease among some.    Screening for diabetes at age 2 and every 3 years.  Cervical cancer prevention:  Pap smear starts at 40 years of age and continues periodically until 40 years old in low risk women. Pap smear every 3 years between 55 and 63 years old. Pap smear every 3-5 years between women 30 and older if pap smear negative and HPV screening negative.   -Breast cancer: Mammogram: There is disagreement between experts about when to start screening in low risk asymptomatic female but recent recommendations are to start screening at 92 and not later than 40 years old , every 1-2 years and after 40 yo q 2 years. Screening is recommended until 40 years  old but some women can continue screening depending of healthy issues. Please schedule appt after your birthday.   Colon cancer screening: starts at 40 years old until 40 years old.  Cholesterol disorder screening at age 39 and every 3 years.  Also recommended:  1. Dental visit- Brush and floss your teeth twice daily; visit your dentist twice a year. 2. Eye doctor- Get an eye exam at least every 2 years. 3. Helmet use- Always wear a helmet when riding a bicycle, motorcycle, rollerblading or skateboarding. 4. Safe sex- If you may be exposed to sexually transmitted infections, use a condom. 5. Seat belts- Seat belts can save your live; always wear one. 6. Smoke/Carbon Monoxide detectors- These detectors need to be installed on the appropriate level of your home. Replace batteries at least once a year. 7. Skin cancer- When out in the sun please cover up and use sunscreen 15 SPF or higher. 8. Violence- If anyone is threatening or hurting you, please tell your healthcare provider.  9. Drink alcohol in moderation- Limit alcohol intake to one drink or less per day. Never drink and drive.  Please be sure medication list is accurate. If a new problem present, please set up appointment sooner than planned today.

## 2018-03-13 ENCOUNTER — Encounter: Payer: Self-pay | Admitting: Family Medicine

## 2018-03-13 LAB — CYTOLOGY - PAP

## 2018-03-15 ENCOUNTER — Encounter: Payer: Self-pay | Admitting: Family Medicine

## 2018-03-18 ENCOUNTER — Other Ambulatory Visit: Payer: Self-pay | Admitting: Family Medicine

## 2018-03-18 DIAGNOSIS — J309 Allergic rhinitis, unspecified: Secondary | ICD-10-CM

## 2018-03-20 ENCOUNTER — Encounter: Payer: Self-pay | Admitting: Obstetrics and Gynecology

## 2018-03-20 ENCOUNTER — Ambulatory Visit: Payer: 59 | Admitting: Obstetrics and Gynecology

## 2018-03-20 ENCOUNTER — Other Ambulatory Visit: Payer: Self-pay

## 2018-03-20 VITALS — BP 116/60 | HR 88 | Resp 16 | Ht 64.75 in | Wt 206.0 lb

## 2018-03-20 DIAGNOSIS — R87611 Atypical squamous cells cannot exclude high grade squamous intraepithelial lesion on cytologic smear of cervix (ASC-H): Secondary | ICD-10-CM

## 2018-03-20 NOTE — Patient Instructions (Addendum)
Colposcopy Colposcopy is a procedure to examine the lowest part of the uterus (cervix), the vagina, and the area around the vaginal opening (vulva) for abnormalities or signs of disease. The procedure is done using a lighted microscope or magnifying lens (colposcope). If any unusual cells are found during the procedure, your health care provider may remove a tissue sample for testing (biopsy). A colposcopy may be done if you:  Have an abnormal Pap test. A Pap test is a screening test that is used to check for signs of cancer or infection of the vagina, cervix, and uterus.  Have a Pap smear test in which you test positive for high-risk HPV (human papillomavirus).  Have a sore or lesion on your cervix.  Have genital warts on your vulva, vagina, or cervix.  Took certain medicines while pregnant, such as diethylstilbestrol (DES).  Have pain during sexual intercourse.  Have vaginal bleeding, especially after sexual intercourse.  Need to have a cervical polyp removed.  Need to have a lost intrauterine device (IUD) string located.  Let your health care provider know about:  Any allergies you have, including allergies to prescribed medicine, latex, or iodine.  All medicines you are taking, including vitamins, herbs, eye drops, creams, and over-the-counter medicines. Bring a list of all of your medicines to your appointment.  Any problems you or family members have had with anesthetic medicines.  Any blood disorders you have.  Any surgeries you have had.  Any medical conditions you have, such as pelvic inflammatory disease (PID) or endometrial disorder.  Any history of frequent fainting.  Your menstrual cycle and what form of birth control (contraception) you use.  Your medical history, including any prior cervical treatment.  Whether you are pregnant or may be pregnant. What are the risks? Generally, this is a safe procedure. However, problems may occur,  including:  Pain.  Infection, which may include a fever, bad-smelling discharge, or pelvic pain.  Bleeding or discharge.  Misdiagnosis.  Fainting and vasovagal reactions, but this is rare.  Allergic reactions to medicines.  Damage to other structures or organs.  What happens before the procedure?  If you have your menstrual period or will have it at the time of your procedure, tell your health care provider. A colposcopy typically is not done during menstruation.  Continue your contraceptive practices before and after the procedure.  For 24 hours before the colposcopy: ? Do not douche. ? Do not use tampons. ? Do not use medicines, creams, or suppositories in the vagina. ? Do not have sexual intercourse.  Ask your health care provider about: ? Changing or stopping your regular medicines. This is especially important if you are taking diabetes medicines or blood thinners. ? Taking medicines such as aspirin and ibuprofen. These medicines can thin your blood. Do not take these medicines before your procedure if your health care provider instructs you not to. It is likely that your health care provider will tell you to avoid taking aspirin or medicine that contains aspirin for 7 days before the procedure.  Follow instructions from your health care provider about eating or drinking restrictions. You will likely need to eat a regular diet the day of the procedure and not skip any meals.  You may have an exam or testing. A pregnancy test will be taken on the day of the procedure.  You may have a blood or urine sample taken.  Plan to have someone take you home from the hospital or clinic.  If you will   be going home right after the procedure, plan to have someone with you for 24 hours. What happens during the procedure?  You will lie down on your back, with your feet in foot rests (stirrups).  A warmed and lubricated instrument (speculum) will be inserted into your vagina. The  speculum will be used to hold apart the walls of your vagina so your health care provider can see your cervix and the inside of your vagina.  A cotton swab will be used to place a small amount of liquid solution on the areas to be examined. This solution makes it easier to see abnormal cells. You may feel a slight burning during this part.  The colposcope will be used to scan the cervix with a bright white light. The colposcope will be held near your vulvaand will magnify your vulva, vagina, and cervix for easier examination.  Your health care provider may decide to take a biopsy. If so: ? You may be given medicine to numb the area (local anesthetic). ? Surgical instruments will be used to suck out mucus and cells through your vagina. ? You may feel mild pain while the tissue sample is removed. ? Bleeding may occur. A solution may be used to stop the bleeding. ? If a sample of tissue is needed from the inside of the cervix, a different procedure called endocervical curettage (ECC) may be completed. During this procedure, a curved instrument (curette) will be used to scrape cells from your cervix or the top of your cervix (endocervix).  Your health care provider will record the location of any abnormalities. The procedure may vary among health care providers and hospitals. What happens after the procedure?  You will lie down and rest for a few minutes. You may be offered juice or cookies.  Your blood pressure, heart rate, breathing rate, and blood oxygen level will be monitored until any medicines you were given have worn off.  You may have to wear compression stockings. These stockings help to prevent blood clots and reduce swelling in your legs.  You may have some cramping in your abdomen. This should go away after a few minutes. This information is not intended to replace advice given to you by your health care provider. Make sure you discuss any questions you have with your health care  provider. Document Released: 01/04/2003 Document Revised: 06/11/2016 Document Reviewed: 05/20/2016 Elsevier Interactive Patient Education  2018 Elsevier Inc.   Human Papillomavirus Human papillomavirus (HPV) is the most common sexually transmitted infection (STI). It easily spreads from person to person (is highly contagious). HPV infections cause genital warts. Certain types of HPV may cause cancers, including cancer of the lower part of the uterus (cervix), vagina, outer female genital area (vulva), penis, anus, and rectum. HPV may also cause cancers of the oral cavity, such as the throat, tongue, and tonsils. There are many types of HPV. It usually does not cause symptoms. However, sometimes there are wart-like lesions in the throat or warts in the genital area that you can see or feel. It is possible to be infected for long periods and pass HPV to others without knowing it. What are the causes? HPV is caused by a virus that spreads from person to person through sexual contact. This includes oral, vaginal, or anal sex. What increases the risk? The following factors may make you more likely to develop this condition:  Having unprotected oral, vaginal, or anal sex.  Having several sex partners.  Having a sex partner who  has other sex partners.  Having or having had another STI.  Having a weak disease-fighting (immune) system.  Having damaged skin in the genital area.  What are the signs or symptoms? Most people who have HPV do not have any symptoms. If symptoms are present, they may include:  Wartlike lesions in the throat (from having oral sex).  Warts on the infected skin or mucous membranes.  Genital warts that may itch, burn, bleed, or be painful during sexual intercourse.  How is this diagnosed? If wartlike lesions are present in the throat or if genital warts are present, your health care provider can usually diagnose HPV with a physical exam. Genital warts are easily seen.  In females, tests may be used to diagnose HPV, including:  A Pap test. A Pap test takes a sample of cells from your cervix to check for cancer and HPV infection.  An HPV test. This is similar to a Pap test and involves taking a sample of cells from your cervix.  Using a scope to view the cervix (colposcopy). This may be done if a pelvic exam or Pap test is abnormal. A sample of tissue may be removed for testing (biopsy) during the colposcopy.  Currently, there is no test to detect HPV in males. How is this treated? There is no treatment for the virus itself. However, there are treatments for the health problems and symptoms HPV can cause. Your health care provider will monitor you closely after you are treated as HPV can come back and may need treatment again. Treatment for HPV may include:  Medicines, which may be injected or applied to genital warts in a cream, lotion, liquid or gel form.  Use of a probe to apply extreme cold (cryotherapy) to the genital warts.  Application of an intense beam of light (laser treatment) on the genital warts.  Use of a probe to apply extreme heat (electrocautery) on the genital warts.  Surgery to remove the genital warts.  Follow these instructions at home: Medicines  Take over-the-counter and prescription medicines only as told by your health care provider. This include creams for itching or irritation.  Do not treat genital warts with medicines used for treating hand warts. General instructions  Do not touch or scratch the warts.  Do not have sex while you are being treated.  Do not douche or use tampons during treatment (women).  Tell your sex partner about your infection. He or she may also need to be treated.  If you become pregnant, tell your health care provider that you have HPV. Your health care provider will monitor you closely during pregnancy to make sure your baby is safe.  Keep all follow-up visits as told by your health care  provider. This is important. How is this prevented?  Talk with your health care provider about getting the HPV vaccines. These vaccines prevent some HPV infections and cancers. The vaccines are recommended for males and females between the ages of 44 and 45. They will not work if you already have HPV, and they are not recommended for pregnant women.  After treatment, use condoms during sex to prevent future infections.  Have only one sex partner.  Have a sex partner who does not have other sex partners.  Get regular Pap tests as directed by your health care provider. Contact a health care provider if:  The treated skin becomes red, swollen, or painful.  You have a fever.  You feel generally ill.  You feel lumps  or pimples sticking out in and around your genital area.  You develop bleeding of the vagina or the treatment area.  You have painful sexual intercourse. Summary  Human papillomavirus (HPV) is the most common sexually transmitted infection (STI) and is highly contagious.  Most people carrying HPV do not have any symptoms.  HPV can be prevented with vaccination. The vaccine is recommended for males and females between the ages of 31 and 41.  There is no treatment for the virus itself. However, there are treatments for the health problems and symptoms HPV can cause. This information is not intended to replace advice given to you by your health care provider. Make sure you discuss any questions you have with your health care provider. Document Released: 01/04/2004 Document Revised: 09/22/2016 Document Reviewed: 09/22/2016 Elsevier Interactive Patient Education  [employer].

## 2018-03-20 NOTE — Progress Notes (Signed)
GYNECOLOGY  VISIT   HPI: 42 y.o.   Married  Caucasian  female   G2P2 with Patient's last menstrual period was 03/04/2018.   here for   referral from Dr. [place] for abnormal pap smears   Pap 03/10/18 - ASCUS, cannot rule out HGSIL.   Hx prior abnormal pap in 2003 and 2006 [facility].  Cryotherapy in 2003 and LEEP in 2006.  Unaware of the level of dysplasia.   States she had tachycardia from prior local anesthesia with her LEEP.  Declines future childbearing.  Had pre-eclampsia and gestational diabetes.   Smoker.  Trying to stop.  Taking Wellbutrin.   Mother of 3, one child adopted.  Is an Airline pilot.   GYNECOLOGIC HISTORY: Patient's last menstrual period was 03/04/2018. Contraception:  Tubal ligation  Menopausal hormone therapy:  none Last mammogram:  n/a Last pap smear:   03/10/18 ASCUS-H         OB History    Gravida  2   Para  2   Term      Preterm      AB      Living        SAB      TAB      Ectopic      Multiple      Live Births                 Patient Active Problem List   Diagnosis Date Noted  . Class 2 obesity with body mass index (BMI) of 36.0 to 36.9 in adult 11/10/2017  . Allergic rhinitis 11/10/2017    Past Medical History:  Diagnosis Date  . ADHD (attention deficit hyperactivity disorder)   . Anxiety   . Asthma   . Depression   . Diabetes mellitus without complication (HCC)    Gestational diabetes  . Fatigue   . Headache   . History of gestational diabetes   . Hormone disorder   . Obsessive-compulsive disorder   . Sinusitis     Past Surgical History:  Procedure Laterality Date  . CHOLECYSTECTOMY  2003  . RHINOPLASTY    . TUBAL LIGATION  2006    Current Outpatient Medications  Medication Sig Dispense Refill  . buPROPion (WELLBUTRIN SR) 150 MG 12 hr tablet TAKE 1 TABLET BY MOUTH TWICE A DAY 60 tablet 1  . fluticasone (FLONASE) 50 MCG/ACT nasal spray PLACE 1 SPRAY INTO BOTH NOSTRILS 2 (TWO) TIMES DAILY. 16 g 3   . ibuprofen (ADVIL,MOTRIN) 600 MG tablet Take 1 tablet (600 mg total) by mouth every 6 (six) hours as needed. 30 tablet 0   No current facility-administered medications for this visit.      ALLERGIES: Lexapro [escitalopram]; Penicillins; Benadryl [diphenhydramine]; and Codeine  Family History  Problem Relation Age of Onset  . Dementia Maternal Aunt   . ADD / ADHD Maternal Uncle   . Stroke Maternal Uncle   . Seizures Mother   . Bipolar disorder Mother   . Cancer Maternal Aunt   . Cervical cancer Maternal Aunt   . Cancer Maternal Aunt   . Heart attack Maternal Uncle     Social History   Socioeconomic History  . Marital status: Married    Spouse name: Not on file  . Number of children: Not on file  . Years of education: Not on file  . Highest education level: Not on file  Occupational History  . Not on file  Social Needs  . Financial resource strain: Not on  file  . Food insecurity:    Worry: Not on file    Inability: Not on file  . Transportation needs:    Medical: Not on file    Non-medical: Not on file  Tobacco Use  . Smoking status: Current Every Day Smoker    Packs/day: 1.00    Years: 13.00    Pack years: 13.00    Types: Cigarettes  . Smokeless tobacco: Never Used  Substance and Sexual Activity  . Alcohol use: No  . Drug use: No  . Sexual activity: Yes    Birth control/protection: Surgical  Lifestyle  . Physical activity:    Days per week: Not on file    Minutes per session: Not on file  . Stress: Not on file  Relationships  . Social connections:    Talks on phone: Not on file    Gets together: Not on file    Attends religious service: Not on file    Active member of club or organization: Not on file    Attends meetings of clubs or organizations: Not on file    Relationship status: Not on file  . Intimate partner violence:    Fear of current or ex partner: Not on file    Emotionally abused: Not on file    Physically abused: Not on file    Forced  sexual activity: Not on file  Other Topics Concern  . Not on file  Social History Narrative  . Not on file    Review of Systems  PHYSICAL EXAMINATION:    BP 116/60 (BP Location: Right Arm, Patient Position: Sitting, Cuff Size: Large)   Pulse 88   Resp 16   Ht 5' 4.75" (1.645 m)   Wt 206 lb (93.4 kg)   LMP 03/04/2018   BMI 34.55 kg/m     General appearance: alert, cooperative and appears stated age Head: Normocephalic, without obvious abnormality, atraumatic Neck: no adenopathy, supple, symmetrical, trachea midline and thyroid normal to inspection and palpation Lungs: clear to auscultation bilaterally Heart: regular rate and rhythm Abdomen: soft, non-tender, no masses,  no organomegaly Extremities: extremities normal, atraumatic, no cyanosis or edema Skin: Skin color, texture, turgor normal. No rashes or lesions No abnormal inguinal nodes palpated Neurologic: Grossly normal  Pelvic: External genitalia:  no lesions              Urethra:  normal appearing urethra with no masses, tenderness or lesions              Bartholins and Skenes: normal                 Vagina: normal appearing vagina with normal color and discharge, no lesions              Cervix: no lesions                Bimanual Exam:  Uterus:  normal size, contour, position, consistency, mobility, non-tender              Adnexa: no mass, fullness, tenderness                 Chaperone was present for exam.  ASSESSMENT  ASCUS-H pap. Hx prior abnormal pap with cryotherapy and LEEP.  Smoker.  Hx tachycardia with epinephrine.   PLAN  Comprehensive discussion of abnormal paps, HPV, colposcopy, and treatment of cervical dysplasia and cervical cancer.  We reviewed the effect of smoking on HPV, abnormal paps and cancer.  Will precert colposcopy.  Discussed Gardasil.  Questions invited and answered.    An After Visit Summary was printed and given to the patient.  _30____ minutes face to face time of which over  50% was spent in counseling.

## 2018-03-20 NOTE — Telephone Encounter (Signed)
Spoke with patient on 03/20/18 on number [phone], and gave her information for Maralyn Sago at [facility] to call and schedule mammogram.

## 2018-03-20 NOTE — Telephone Encounter (Signed)
Copied from CRM [id number]. Topic: General - Other >> Mar 19, 2018  9:02 AM Maia Petties wrote: Reason for CRM: She contacted pt at [phone] and was told she had the wrong # for scheduling Mammogram

## 2018-03-27 ENCOUNTER — Telehealth: Payer: Self-pay | Admitting: Obstetrics and Gynecology

## 2018-03-27 NOTE — Telephone Encounter (Signed)
Returning call to Rosa °

## 2018-03-27 NOTE — Telephone Encounter (Signed)
Call placed to convey benefits. 

## 2018-04-03 ENCOUNTER — Ambulatory Visit (INDEPENDENT_AMBULATORY_CARE_PROVIDER_SITE_OTHER): Payer: 59 | Admitting: Obstetrics and Gynecology

## 2018-04-03 ENCOUNTER — Encounter: Payer: Self-pay | Admitting: Obstetrics and Gynecology

## 2018-04-03 ENCOUNTER — Other Ambulatory Visit: Payer: Self-pay

## 2018-04-03 DIAGNOSIS — R87611 Atypical squamous cells cannot exclude high grade squamous intraepithelial lesion on cytologic smear of cervix (ASC-H): Secondary | ICD-10-CM | POA: Insufficient documentation

## 2018-04-03 NOTE — Patient Instructions (Signed)
Colposcopy, Care After  This sheet gives you information about how to care for yourself after your procedure. Your doctor may also give you more specific instructions. If you have problems or questions, contact your doctor.  What can I expect after the procedure?  If you did not have a tissue sample removed (did not have a biopsy), you may only have some spotting for a few days. You can go back to your normal activities.  If you had a tissue sample removed, it is common to have:  · Soreness and pain. This may last for a few days.  · Light-headedness.  · Mild bleeding from your vagina or dark-colored, grainy discharge from your vagina. This may last for a few days. You may need to wear a sanitary pad.  · Spotting for at least 48 hours after the procedure.    Follow these instructions at home:  · Take over-the-counter and prescription medicines only as told by your doctor. Ask your doctor what medicines you can start taking again. This is very important if you take blood-thinning medicine.  · Do not drive or use heavy machinery while taking prescription pain medicine.  · For 3 days, or as long as your doctor tells you, avoid:  ? Douching.  ? Using tampons.  ? Having sex.  · If you use birth control (contraception), keep using it.  · Limit activity for the first day after the procedure. Ask your doctor what activities are safe for you.  · It is up to you to get the results of your procedure. Ask your doctor when your results will be ready.  · Keep all follow-up visits as told by your doctor. This is important.  Contact a doctor if:  · You get a skin rash.  Get help right away if:  · You are bleeding a lot from your vagina. It is a lot of bleeding if you are using more than one pad an hour for 2 hours in a row.  · You have clumps of blood (blood clots) coming from your vagina.  · You have a fever.  · You have chills  · You have pain in your lower belly (pelvic area).  · You have signs of infection, such as vaginal  discharge that is:  ? Different than usual.  ? Yellow.  ? Bad-smelling.  · You have very pain or cramps in your lower belly that do not get better with medicine.  · You feel light-headed.  · You feel dizzy.  · You pass out (faint).  Summary  · If you did not have a tissue sample removed (did not have a biopsy), you may only have some spotting for a few days. You can go back to your normal activities.  · If you had a tissue sample removed, it is common to have mild pain and spotting for 48 hours.  · For 3 days, or as long as your doctor tells you, avoid douching, using tampons and having sex.  · Get help right away if you have bleeding, very bad pain, or signs of infection.  This information is not intended to replace advice given to you by your health care provider. Make sure you discuss any questions you have with your health care provider.  Document Released: 04/01/2008 Document Revised: 07/03/2016 Document Reviewed: 07/03/2016  Elsevier Interactive Patient Education © 2018 Elsevier Inc.

## 2018-04-03 NOTE — Progress Notes (Signed)
  Subjective:     Patient ID: Barbara Barr, female   DOB: 06-19-1976, 42 y.o.   MRN: [id number]  HPI Pap History: Pap 03/10/18 - ASCUS, cannot rule out HGSIL.   Hx prior abnormal pap in 2003 and 2006 [facility].  Cryotherapy in 2003 and LEEP in 2006.  Unaware of the level of dysplasia.   Review of Systems  Constitutional: Negative.   HENT: Negative.   Eyes: Negative.   Respiratory: Negative.   Cardiovascular: Negative.   Gastrointestinal: Negative.   Endocrine: Negative.   Genitourinary: Negative.   Musculoskeletal: Negative.   Skin: Negative.   Allergic/Immunologic: Negative.   Neurological: Negative.   Hematological: Negative.   Psychiatric/Behavioral: Negative.    LMP: 03/27/18 Contraception: Tubal Ligation  UPT:  Negative.     Objective:   Physical Exam  Genitourinary:    Colposcopy Consent for procedure. 3% acetic acid used.  Colposcopy satisfactory.  Ring of acetowhite change around the transformation zone.  Mosaics extend beyond transformation zone at 3:00.  Thin rim of raised almost vesicular change from 9:00 - 12:00.  ECC, biopsy from 3, 8, and 12:00 sent to pathology separately.  Monsel's placed. Minimal EBL.  No complications.     Assessment:     ASCUS-H pap.  Hx prior LEEP and cryotherapy.    Plan:     Discussed colposcopy findings.  FU biopsy results.  Post colpo instructions given.  We discussed LEEP and cold knife conization as treatment options for dysplasia.  Questions invited and answered.  After visit summary to patient.

## 2018-04-07 ENCOUNTER — Ambulatory Visit (INDEPENDENT_AMBULATORY_CARE_PROVIDER_SITE_OTHER): Payer: 59 | Admitting: Pulmonary Disease

## 2018-04-07 ENCOUNTER — Encounter: Payer: Self-pay | Admitting: Pulmonary Disease

## 2018-04-07 DIAGNOSIS — Z87891 Personal history of nicotine dependence: Secondary | ICD-10-CM | POA: Insufficient documentation

## 2018-04-07 DIAGNOSIS — Z720 Tobacco use: Secondary | ICD-10-CM

## 2018-04-07 DIAGNOSIS — R0683 Snoring: Secondary | ICD-10-CM | POA: Insufficient documentation

## 2018-04-07 HISTORY — DX: Personal history of nicotine dependence: Z87.891

## 2018-04-07 NOTE — Assessment & Plan Note (Signed)
Given excessive daytime somnolence, narrow pharyngeal exam, witnessed apneas & loud snoring, obstructive sleep apnea is very likely & an overnight polysomnogram will be scheduled as a home study. The pathophysiology of obstructive sleep apnea , it's cardiovascular consequences & modes of treatment including CPAP were discused with the patient in detail & they evidenced understanding.  Pretest probability is intermediate.  If she has more than moderate sleep apnea we will use CPAP, as she has only mild ,mouthguard  would suffice.  If she does not have OSA then would suggest alternative such as  Provent/Theravent device

## 2018-04-07 NOTE — Patient Instructions (Signed)
Home sleep study.  In the  meantime checkout Provent/Theravent

## 2018-04-07 NOTE — Assessment & Plan Note (Signed)
Counselled. On Wellbutrin

## 2018-04-07 NOTE — Addendum Note (Signed)
Addended by: Maurene CapesPOTTS, Arshia Rondon M on: 04/07/2018 04:09 PM   Modules accepted: Orders

## 2018-04-07 NOTE — Progress Notes (Signed)
Subjective:    Patient ID: Barbara Barr, female    DOB: [birth year], 42 y.o.   MRN: [id number]  HPI  42 year old smoker presents for evaluation of sleep disordered breathing and loud snoring. Husband has noted loud snoring for the past 2 years and now has to sleep in a different room. She underwent sinus surgery by ENT but this does not help.  In the past she has used a mouthguard especially made by her dentist for teeth grinding which she has on occasion taken this out and thrown it away. She works as an Airline pilot. Epworth sleepiness score is 9 and she reports sleepiness while watching TV, as a passenger in a car or lying down to rest in the afternoons. Bedtime is between 9:11 PM, sleep latency is minimal, she sleeps on her stomach are on her side with one pillow, denies nocturnal awakenings and is out of bed at 6 AM feeling rested without dryness of mouth or headaches.  Naps are rare but she will go to bed and sleep for 2 hours on the weekend if she overdoes this. She has gained about 20 pounds over the last 2 years There is no history suggestive of cataplexy, sleep paralysis or parasomnias   She reports asthma since childhood and has always needed an inhaler, triggers being change of seasons or humidity or severe exertion, last exacerbation was more than 15 years ago. She smoked about 2 packs/day but has now cut down to about half pack per day using bupropion.  She works as an Airline pilot and lives with her husband and 3 kids  Past Medical History:  Diagnosis Date  . ADHD (attention deficit hyperactivity disorder)   . Anxiety   . Asthma   . Depression   . Diabetes mellitus without complication (HCC)    Gestational diabetes  . Fatigue   . Headache   . History of gestational diabetes   . Hormone disorder   . Obsessive-compulsive disorder   . Sinusitis      Past Surgical History:  Procedure Laterality Date  . CHOLECYSTECTOMY  2003  . RHINOPLASTY    . TUBAL LIGATION   2006    Allergies  Allergen Reactions  . Lexapro [Escitalopram] Hives  . Penicillins Nausea And Vomiting  . Benadryl [Diphenhydramine] Rash  . Codeine Itching    Social History   Socioeconomic History  . Marital status: Married    Spouse name: Not on file  . Number of children: Not on file  . Years of education: Not on file  . Highest education level: Not on file  Occupational History  . Not on file  Social Needs  . Financial resource strain: Not on file  . Food insecurity:    Worry: Not on file    Inability: Not on file  . Transportation needs:    Medical: Not on file    Non-medical: Not on file  Tobacco Use  . Smoking status: Current Every Day Smoker    Packs/day: 1.00    Years: 13.00    Pack years: 13.00    Types: Cigarettes  . Smokeless tobacco: Never Used  Substance and Sexual Activity  . Alcohol use: No  . Drug use: No  . Sexual activity: Yes    Birth control/protection: Surgical  Lifestyle  . Physical activity:    Days per week: Not on file    Minutes per session: Not on file  . Stress: Not on file  Relationships  . Social connections:  Talks on phone: Not on file    Gets together: Not on file    Attends religious service: Not on file    Active member of club or organization: Not on file    Attends meetings of clubs or organizations: Not on file    Relationship status: Not on file  . Intimate partner violence:    Fear of current or ex partner: Not on file    Emotionally abused: Not on file    Physically abused: Not on file    Forced sexual activity: Not on file  Other Topics Concern  . Not on file  Social History Narrative  . Not on file    Family History  Problem Relation Age of Onset  . Dementia Maternal Aunt   . ADD / ADHD Maternal Uncle   . Stroke Maternal Uncle   . Seizures Mother   . Bipolar disorder Mother   . Cancer Maternal Aunt   . Cervical cancer Maternal Aunt   . Cancer Maternal Aunt   . Heart attack Maternal Uncle       Review of Systems   Positive for headaches, teeth grinding  Constitutional: negative for anorexia, fevers and sweats  Eyes: negative for irritation, redness and visual disturbance  Ears, nose, mouth, throat, and face: negative for earaches, epistaxis, nasal congestion and sore throat  Respiratory: negative for cough, dyspnea on exertion, sputum and wheezing  Cardiovascular: negative for chest pain, dyspnea, lower extremity edema, orthopnea, palpitations and syncope  Gastrointestinal: negative for abdominal pain, constipation, diarrhea, melena, nausea and vomiting  Genitourinary:negative for dysuria, frequency and hematuria  Hematologic/lymphatic: negative for bleeding, easy bruising and lymphadenopathy  Musculoskeletal:negative for arthralgias, muscle weakness and stiff joints  Neurological: negative for coordination problems, gait problems,  and weakness  Endocrine: negative for diabetic symptoms including polydipsia, polyuria and weight loss     Objective:   Physical Exam  Gen. Pleasant, obese, in no distress, normal affect ENT -  no post nasal drip, class 2-3 airway Neck: No JVD, no thyromegaly, no carotid bruits Lungs: no use of accessory muscles, no dullness to percussion, decreased without rales or rhonchi  Cardiovascular: Rhythm regular, heart sounds  normal, no murmurs or gallops, no peripheral edema Abdomen: soft and non-tender, no hepatosplenomegaly, BS normal. Musculoskeletal: No deformities, no cyanosis or clubbing Neuro:  alert, non focal, no tremors       Assessment & Plan:

## 2018-04-09 ENCOUNTER — Telehealth: Payer: Self-pay | Admitting: Obstetrics and Gynecology

## 2018-04-09 ENCOUNTER — Encounter: Payer: Self-pay | Admitting: Family Medicine

## 2018-04-09 ENCOUNTER — Encounter: Payer: Self-pay | Admitting: Obstetrics and Gynecology

## 2018-04-09 DIAGNOSIS — D069 Carcinoma in situ of cervix, unspecified: Secondary | ICD-10-CM

## 2018-04-09 NOTE — Telephone Encounter (Signed)
-----   Message from Mychart, Generic sent at 04/09/2018 3:23 PM EDT -----    Just wondering if my test results have came back yet?

## 2018-04-09 NOTE — Telephone Encounter (Signed)
-----   Message from Patton SallesBrook E Amundson C Silva, MD sent at 04/07/2018  1:49 PM EDT ----- Please contact patient with results.  Her colposcopy showed high grade dysplasia, and she needs a LEEP with me.  (She does have a history of a prior LEEP as well.)

## 2018-04-09 NOTE — Telephone Encounter (Signed)
Call to patient. Advised of results and recommendations as instructed by Dr Edward JollySilva.  Reviewed LEEP procedure and instructed to take Motrin 800 mg one hour prior with food. Patient has had BTSP. LMP 02-27-18. Has irregular cycles, this is normal for her.  LEEP scheduled for 04-29-18 per patient request. She is advised that she should call if does not start cycle by this date and cannot be bleeding at time of procedure.   Encounter closed.

## 2018-04-10 ENCOUNTER — Other Ambulatory Visit: Payer: Self-pay | Admitting: *Deleted

## 2018-04-10 DIAGNOSIS — J309 Allergic rhinitis, unspecified: Secondary | ICD-10-CM

## 2018-04-10 MED ORDER — FLUTICASONE PROPIONATE 50 MCG/ACT NA SUSP: 1.0000 | Freq: Two times a day (BID) | NASAL | 3 refills | Status: DC

## 2018-04-15 NOTE — Telephone Encounter (Signed)
Call placed to patient to review benefits for scheduled LEEP with colposcopy procedure. Left voicemail message requesting a return call

## 2018-04-24 ENCOUNTER — Telehealth: Payer: Self-pay | Admitting: Obstetrics and Gynecology

## 2018-04-24 NOTE — Telephone Encounter (Signed)
Patient returned call. Reviewed benefits for LEEP and colposcopy procedures scheduled on 04/29/18 with Dr Edward JollySilva. See account notes for details. Patient understood and agreeable. Patient is aware of appointment date, arrival time and cancellation policy.   Forwarding to provider for final review. Patient is agreeable to disposition. Will close encounter    cc: Billie RuddySally Yeakley, RN  cc: [facility] [place] Davis

## 2018-04-24 NOTE — Telephone Encounter (Signed)
Opened in error

## 2018-04-28 NOTE — Progress Notes (Deleted)
GYNECOLOGY  VISIT   HPI: 42 y.o.   Married  Caucasian  female   G2P2 with No LMP recorded.   here for   LEEP   GYNECOLOGIC HISTORY: No LMP recorded. Contraception:  Tubal ligation  Menopausal hormone therapy:  None  Last mammogram:  *** Last pap smear:   ***        OB History    Gravida  2   Para  2   Term      Preterm      AB      Living        SAB      TAB      Ectopic      Multiple      Live Births                 Patient Active Problem List   Diagnosis Date Noted  . Snoring 04/07/2018  . Tobacco abuse 04/07/2018  . Pap smear of cervix with ASCUS, cannot exclude HGSIL 04/03/2018  . Class 2 obesity with body mass index (BMI) of 36.0 to 36.9 in adult 11/10/2017  . Allergic rhinitis 11/10/2017    Past Medical History:  Diagnosis Date  . ADHD (attention deficit hyperactivity disorder)   . Anxiety   . Asthma   . Depression   . Diabetes mellitus without complication (HCC)    Gestational diabetes  . Fatigue   . Headache   . History of gestational diabetes   . Hormone disorder   . Obsessive-compulsive disorder   . Sinusitis     Past Surgical History:  Procedure Laterality Date  . CHOLECYSTECTOMY  2003  . RHINOPLASTY    . TUBAL LIGATION  2006    Current Outpatient Medications  Medication Sig Dispense Refill  . buPROPion (WELLBUTRIN SR) 150 MG 12 hr tablet TAKE 1 TABLET BY MOUTH TWICE A DAY 60 tablet 1  . fluticasone (FLONASE) 50 MCG/ACT nasal spray Place 1 spray into both nostrils 2 (two) times daily. 16 g 3   No current facility-administered medications for this visit.      ALLERGIES: Lexapro [escitalopram]; Penicillins; Benadryl [diphenhydramine]; and Codeine  Family History  Problem Relation Age of Onset  . Dementia Maternal Aunt   . ADD / ADHD Maternal Uncle   . Stroke Maternal Uncle   . Seizures Mother   . Bipolar disorder Mother   . Cancer Maternal Aunt   . Cervical cancer Maternal Aunt   . Cancer Maternal Aunt   . Heart  attack Maternal Uncle     Social History   Socioeconomic History  . Marital status: Married    Spouse name: Not on file  . Number of children: Not on file  . Years of education: Not on file  . Highest education level: Not on file  Occupational History  . Not on file  Social Needs  . Financial resource strain: Not on file  . Food insecurity:    Worry: Not on file    Inability: Not on file  . Transportation needs:    Medical: Not on file    Non-medical: Not on file  Tobacco Use  . Smoking status: Current Every Day Smoker    Packs/day: 1.00    Years: 13.00    Pack years: 13.00    Types: Cigarettes  . Smokeless tobacco: Never Used  Substance and Sexual Activity  . Alcohol use: No  . Drug use: No  . Sexual activity: Yes    Birth  control/protection: Surgical  Lifestyle  . Physical activity:    Days per week: Not on file    Minutes per session: Not on file  . Stress: Not on file  Relationships  . Social connections:    Talks on phone: Not on file    Gets together: Not on file    Attends religious service: Not on file    Active member of club or organization: Not on file    Attends meetings of clubs or organizations: Not on file    Relationship status: Not on file  . Intimate partner violence:    Fear of current or ex partner: Not on file    Emotionally abused: Not on file    Physically abused: Not on file    Forced sexual activity: Not on file  Other Topics Concern  . Not on file  Social History Narrative  . Not on file    Review of Systems  PHYSICAL EXAMINATION:    There were no vitals taken for this visit.    General appearance: alert, cooperative and appears stated age Head: Normocephalic, without obvious abnormality, atraumatic Neck: no adenopathy, supple, symmetrical, trachea midline and thyroid normal to inspection and palpation Lungs: clear to auscultation bilaterally Breasts: normal appearance, no masses or tenderness, No nipple retraction or  dimpling, No nipple discharge or bleeding, No axillary or supraclavicular adenopathy Heart: regular rate and rhythm Abdomen: soft, non-tender, no masses,  no organomegaly Extremities: extremities normal, atraumatic, no cyanosis or edema Skin: Skin color, texture, turgor normal. No rashes or lesions Lymph nodes: Cervical, supraclavicular, and axillary nodes normal. No abnormal inguinal nodes palpated Neurologic: Grossly normal  Pelvic: External genitalia:  no lesions              Urethra:  normal appearing urethra with no masses, tenderness or lesions              Bartholins and Skenes: normal                 Vagina: normal appearing vagina with normal color and discharge, no lesions              Cervix: no lesions                Bimanual Exam:  Uterus:  normal size, contour, position, consistency, mobility, non-tender              Adnexa: no mass, fullness, tenderness              Rectal exam: {yes no:314532}.  Confirms.              Anus:  normal sphincter tone, no lesions  Chaperone was present for exam.  ASSESSMENT     PLAN     An After Visit Summary was printed and given to the patient.  ______ minutes face to face time of which over 50% was spent in counseling.

## 2018-04-29 ENCOUNTER — Encounter: Payer: Self-pay | Admitting: Obstetrics and Gynecology

## 2018-04-29 ENCOUNTER — Other Ambulatory Visit: Payer: Self-pay

## 2018-04-29 ENCOUNTER — Ambulatory Visit (INDEPENDENT_AMBULATORY_CARE_PROVIDER_SITE_OTHER): Payer: 59 | Admitting: Obstetrics and Gynecology

## 2018-04-29 VITALS — BP 110/68 | HR 84 | Resp 16 | Ht 64.75 in | Wt 208.0 lb

## 2018-04-29 DIAGNOSIS — D069 Carcinoma in situ of cervix, unspecified: Secondary | ICD-10-CM

## 2018-04-29 DIAGNOSIS — Z01812 Encounter for preprocedural laboratory examination: Secondary | ICD-10-CM | POA: Diagnosis not present

## 2018-04-29 LAB — POCT URINE PREGNANCY: PREG TEST UR: NEGATIVE

## 2018-04-29 NOTE — Progress Notes (Signed)
  Subjective:     Patient ID: Barbara Barr, female   DOB: [birth year], 42 y.o.   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 ECC negative.  Pap 03/10/18 - ASCUS, cannot rule out HGSIL.   Hx prior abnormal pap in 2003 and 2006 [facility].  Cryotherapy in 2003 and LEEP in 2006.  Unaware of the level of dysplasia.   State she has always had trouble with her prior cervical procedures for dysplasia due to her anatomy.   Hoping to have surgery before July 17 or may need to wait until December.  Review of Systems LMP: 04/23/18 Contraception: tubal ligation UPT: negative Patient has taken 800mg  of Ibuprofen at 9:00am    Objective:   Physical Exam  Colposcopy done.  3% acetic acid used.  Ring of acetwhite change around the cervical os.  Unable to have good retraction of the vaginal sidewalls.  Acetowhite lesion at 9:00 extends far enough to the periphery of the cervix that the vagina will be too close to perform the LEEP safely.   I tried different speculums and did not have success to see.  The insulated speculum with side wall retractors will not comfortably fit the patient today.     Assessment:     HGSIL, CIN II/II/CIS. Hx prior LEEP and cryotherapy.   Plan:     LEEP not performed today.  We will proceed in an outpatient surgical setting.   Rationale explained and the patient is in agreement.  I do not recommend delaying surgery until December 2019.  We talked about smoking cessation to improve the health of her cervix.  __15_____ minutes face to face time of which over 50% was spent in counseling.   After visit summary to patient.

## 2018-04-29 NOTE — Patient Instructions (Signed)
Glendy,   We were not able to complete your procedure today due to inadequacy of performing this in an office setting.  This was not predictable prior to today.  We will need to proceed in an outpatient surgical setting.  Your recovery will be the day of surgery and the following day.  You may return to normal work on the second post operative day.  I do not recommend waiting until December to receive this important care.   Conley SimmondsBrook Silva, MD

## 2018-05-01 DIAGNOSIS — R0683 Snoring: Secondary | ICD-10-CM | POA: Diagnosis not present

## 2018-05-05 ENCOUNTER — Telehealth: Payer: Self-pay | Admitting: Pulmonary Disease

## 2018-05-05 DIAGNOSIS — R0683 Snoring: Secondary | ICD-10-CM | POA: Diagnosis not present

## 2018-05-05 NOTE — Telephone Encounter (Signed)
Called and spoke with pt letting her know the results of HST.  Pt expressed understanding. Nothing further needed.

## 2018-05-05 NOTE — Telephone Encounter (Signed)
Patient returned phone call; pt contact #  [phone]

## 2018-05-05 NOTE — Telephone Encounter (Signed)
Per RA, HST did not show OSA. No RX required at this time.   ATC patient but VM was full at the time. Will ATC patient again later.

## 2018-05-06 ENCOUNTER — Telehealth: Payer: Self-pay | Admitting: Obstetrics and Gynecology

## 2018-05-06 NOTE — Telephone Encounter (Signed)
Call to patient. Discussed date options for LEEP procedure. Patient desires to proceed on 05-12-18. Advised will schedule and call her back once confirmed.

## 2018-05-06 NOTE — Telephone Encounter (Signed)
Spoke with patient regarding benefit for surgery. Patient understood and agreeable. Patient confirmed she is ready to proceed with scheduling. Advised patient, 05/12/18 may be an available option for the surgery date, patient advises she can schedule on that date. Patient aware this is professional benefit only. Patient aware will be contacted by hospital for separate benefits. Forwarding to Building control surveyorurse Supervisor for scheduling.  Routing to Billie RuddySally Yeakley, RN

## 2018-05-06 NOTE — Telephone Encounter (Signed)
[facility].Call to patient to review instructions. Left message to call back.

## 2018-05-07 ENCOUNTER — Encounter (HOSPITAL_BASED_OUTPATIENT_CLINIC_OR_DEPARTMENT_OTHER): Payer: Self-pay | Admitting: *Deleted

## 2018-05-07 NOTE — Telephone Encounter (Signed)
Call to patient. Left message to call back.  

## 2018-05-08 ENCOUNTER — Other Ambulatory Visit: Payer: Self-pay

## 2018-05-08 ENCOUNTER — Encounter (HOSPITAL_BASED_OUTPATIENT_CLINIC_OR_DEPARTMENT_OTHER): Payer: Self-pay | Admitting: *Deleted

## 2018-05-08 NOTE — Progress Notes (Signed)
Spoke w/ pt via phone for pre-op interview.  Npo after mn.  Arrive at 0900.  Needs urine preg.  Getting cbc, bmet, and ekg done Monday 05-11-2018 @ 1300.  Will take am meds w/ sips of water.

## 2018-05-08 NOTE — Telephone Encounter (Signed)
Call from patient.Confirmed [facility]12-[facility] ay [facility]. Arrive 0900.  Surgery instruction sheet reviewed.   Encounter closed.

## 2018-05-11 ENCOUNTER — Encounter (HOSPITAL_COMMUNITY)
Admission: RE | Admit: 2018-05-11 | Discharge: 2018-05-11 | Disposition: A | Discharge status: Home / Self Care | Payer: 59 | Source: Ambulatory Visit | Attending: Obstetrics and Gynecology | Admitting: Obstetrics and Gynecology

## 2018-05-11 DIAGNOSIS — F17200 Nicotine dependence, unspecified, uncomplicated: Secondary | ICD-10-CM | POA: Diagnosis not present

## 2018-05-11 DIAGNOSIS — F419 Anxiety disorder, unspecified: Secondary | ICD-10-CM | POA: Diagnosis not present

## 2018-05-11 DIAGNOSIS — N871 Moderate cervical dysplasia: Secondary | ICD-10-CM | POA: Diagnosis not present

## 2018-05-11 DIAGNOSIS — F329 Major depressive disorder, single episode, unspecified: Secondary | ICD-10-CM | POA: Diagnosis not present

## 2018-05-11 LAB — CBC
HCT: 41.1 % (ref 36.0–46.0)
Hemoglobin: 13.3 g/dL (ref 12.0–15.0)
MCH: 28.2 pg (ref 26.0–34.0)
MCHC: 32.4 g/dL (ref 30.0–36.0)
MCV: 87.3 fL (ref 78.0–100.0)
Platelets: 329 10*3/uL (ref 150–400)
RBC: 4.71 MIL/uL (ref 3.87–5.11)
RDW: 13.7 % (ref 11.5–15.5)
WBC: 11.7 10*3/uL — ABNORMAL HIGH (ref 4.0–10.5)

## 2018-05-11 LAB — BASIC METABOLIC PANEL
Anion gap: 7 (ref 5–15)
BUN: 12 mg/dL (ref 6–20)
CALCIUM: 9.7 mg/dL (ref 8.9–10.3)
CHLORIDE: 106 mmol/L (ref 98–111)
CO2: 26 mmol/L (ref 22–32)
CREATININE: 0.77 mg/dL (ref 0.44–1.00)
GFR calc non Af Amer: >60 mL/min (ref >60)
Glucose, Bld: 90 mg/dL (ref 70–99)
Potassium: 4.3 mmol/L (ref 3.5–5.1)
SODIUM: 139 mmol/L (ref 135–145)

## 2018-05-11 NOTE — H&P (Signed)
Office Visit   [facility]29/2018 [facility]    Barbara Barr, Barbara HimBrook E, MD  Obstetrics and Gynecology   Carcinoma in situ of cervix, unspecified location +1 more  Dx   Referred by [name], [name], MD  Reason for Visit   Additional Documentation   Vitals:   BP 110/68 (BP Location: Right Arm, Patient Position: Sitting, Cuff Size: Normal)   Pulse 84   Resp 16   Ht 5' 4.75" (1.645 m)   Wt 208 lb (94.3 kg)   LMP 04/23/2018   BMI 34.88 kg/m   BSA 2.08 m      More Vitals   Flowsheets:   [name],   Anthropometrics     Encounter Info:   Billing Info,   History,   Allergies,   Detailed Report     All Notes   Progress Notes by Barbara Barr, Barbara Pung E, MD at 04/29/2018 10:00 AM  Author: Patton SallesAmundson C Barr, Barbara Eastmond E, MD Author Type: Physician Filed: 04/30/2018 9:58 PM  Note Status: Signed Cosign: Cosign Not Required Encounter Date: 04/29/2018  Editor: Barbara Barr, [name], MD (Physician)  Prior Versions: 1. Lum BabeMitchell, Barbara A, CMA (Certified Medical Assistant) at 04/29/2018 10:25 AM - Sign at close encounter     Subjective:     Patient ID: Barbara Barr, female   DOB: 19-Jul-1976, 42 y.o.   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 ECC negative.  Pap 03/10/18 - ASCUS, cannot rule out HGSIL.   Hx prior abnormal pap in 2003 and 2006 [facility].  Cryotherapy in 2003 and LEEP in 2006.  Unaware of the level of dysplasia.  State she has always had trouble with her prior cervical procedures for dysplasia due to her anatomy.   Hoping to have surgery before July 17 or may need to wait until December.  Review of Systems LMP: 04/23/18 Contraception: tubal ligation UPT: negative Patient has taken 800mg  of Ibuprofen at 9:00am    Objective:   Physical Exam  Colposcopy done.  3% acetic acid used.  Ring of acetwhite change around the cervical os.  Unable to have good retraction of the vaginal  sidewalls.  Acetowhite lesion at 9:00 extends far enough to the periphery of the cervix that the vagina will be too close to perform the LEEP safely.   I tried different speculums and did not have success to see.  The insulated speculum with side wall retractors will not comfortably fit the patient today.     Assessment:     HGSIL, CIN II/II/CIS. Hx prior LEEP and cryotherapy.   Plan:     LEEP not performed today.  We will proceed in an outpatient surgical setting.   Rationale explained and the patient is in agreement.  I do not recommend delaying surgery until December 2019.  We talked about smoking cessation to improve the health of her cervix.  __15_____ minutes face to face time of which over 50% was spent in counseling.   After visit summary to patient.

## 2018-05-12 ENCOUNTER — Ambulatory Visit (HOSPITAL_BASED_OUTPATIENT_CLINIC_OR_DEPARTMENT_OTHER): Payer: 59 | Admitting: Anesthesiology

## 2018-05-12 ENCOUNTER — Encounter (HOSPITAL_BASED_OUTPATIENT_CLINIC_OR_DEPARTMENT_OTHER)
Admission: RE | Disposition: A | Discharge status: Home / Self Care | Payer: Self-pay | Source: Ambulatory Visit | Attending: Obstetrics and Gynecology

## 2018-05-12 ENCOUNTER — Ambulatory Visit (HOSPITAL_BASED_OUTPATIENT_CLINIC_OR_DEPARTMENT_OTHER)
Admission: RE | Admit: 2018-05-12 | Discharge: 2018-05-12 | Disposition: A | Discharge status: Home / Self Care | Payer: 59 | Source: Ambulatory Visit | Attending: Obstetrics and Gynecology | Admitting: Obstetrics and Gynecology

## 2018-05-12 ENCOUNTER — Encounter (HOSPITAL_BASED_OUTPATIENT_CLINIC_OR_DEPARTMENT_OTHER): Payer: Self-pay

## 2018-05-12 DIAGNOSIS — F329 Major depressive disorder, single episode, unspecified: Secondary | ICD-10-CM | POA: Insufficient documentation

## 2018-05-12 DIAGNOSIS — R87613 High grade squamous intraepithelial lesion on cytologic smear of cervix (HGSIL): Secondary | ICD-10-CM | POA: Diagnosis not present

## 2018-05-12 DIAGNOSIS — N871 Moderate cervical dysplasia: Secondary | ICD-10-CM | POA: Diagnosis not present

## 2018-05-12 DIAGNOSIS — F17200 Nicotine dependence, unspecified, uncomplicated: Secondary | ICD-10-CM | POA: Insufficient documentation

## 2018-05-12 DIAGNOSIS — F419 Anxiety disorder, unspecified: Secondary | ICD-10-CM | POA: Insufficient documentation

## 2018-05-12 DIAGNOSIS — D069 Carcinoma in situ of cervix, unspecified: Secondary | ICD-10-CM | POA: Diagnosis not present

## 2018-05-12 HISTORY — PX: COLPOSCOPY: SHX161

## 2018-05-12 HISTORY — DX: Other specified postprocedural states: R11.2

## 2018-05-12 HISTORY — DX: Other allergy status, other than to drugs and biological substances: Z91.09

## 2018-05-12 HISTORY — PX: LEEP: SHX91

## 2018-05-12 HISTORY — DX: Presence of spectacles and contact lenses: Z97.3

## 2018-05-12 HISTORY — DX: Anemia, unspecified: D64.9

## 2018-05-12 HISTORY — DX: Other specified postprocedural states: Z98.890

## 2018-05-12 HISTORY — DX: Dysplasia of cervix uteri, unspecified: N87.9

## 2018-05-12 HISTORY — DX: Other seasonal allergic rhinitis: J30.2

## 2018-05-12 HISTORY — DX: Prediabetes: R73.03

## 2018-05-12 LAB — POCT PREGNANCY, URINE: Preg Test, Ur: NEGATIVE

## 2018-05-12 LAB — GLUCOSE, CAPILLARY: Glucose-Capillary: 94 mg/dL (ref 70–99)

## 2018-05-12 SURGERY — LEEP (LOOP ELECTROSURGICAL EXCISION PROCEDURE)
Anesthesia: General | Site: Cervix

## 2018-05-12 MED ORDER — FERRIC SUBSULFATE SOLN
Status: DC | PRN
  Administered 2018-05-12: 1

## 2018-05-12 MED ORDER — LACTATED RINGERS IV SOLN
INTRAVENOUS | Status: DC
  Filled 2018-05-12: qty 1000

## 2018-05-12 MED ORDER — ACETIC ACID 5 % SOLN
Status: DC | PRN
  Administered 2018-05-12: 1 via TOPICAL

## 2018-05-12 MED ORDER — ONDANSETRON HCL 4 MG/2ML IJ SOLN
INTRAMUSCULAR | Status: AC
  Filled 2018-05-12: qty 2

## 2018-05-12 MED ORDER — SCOPOLAMINE 1 MG/3DAYS TD PT72
1.0000 | MEDICATED_PATCH | TRANSDERMAL | Status: DC
  Administered 2018-05-12: 1.5 mg via TRANSDERMAL
  Filled 2018-05-12: qty 1

## 2018-05-12 MED ORDER — LIDOCAINE-EPINEPHRINE (PF) 1 %-1:200000 IJ SOLN
INTRAMUSCULAR | Status: DC | PRN
  Administered 2018-05-12: 10 mL

## 2018-05-12 MED ORDER — LIDOCAINE 2% (20 MG/ML) 5 ML SYRINGE
INTRAMUSCULAR | Status: AC
  Filled 2018-05-12: qty 5

## 2018-05-12 MED ORDER — CEFAZOLIN SODIUM-DEXTROSE 2-3 GM-%(50ML) IV SOLR
INTRAVENOUS | Status: DC | PRN
  Administered 2018-05-12: 2 g via INTRAVENOUS

## 2018-05-12 MED ORDER — KETOROLAC TROMETHAMINE 30 MG/ML IJ SOLN
INTRAMUSCULAR | Status: AC
  Filled 2018-05-12: qty 1

## 2018-05-12 MED ORDER — IODINE STRONG (LUGOLS) 5 % PO SOLN
ORAL | Status: DC | PRN
  Administered 2018-05-12: 0.2 mL via ORAL

## 2018-05-12 MED ORDER — MIDAZOLAM HCL 5 MG/5ML IJ SOLN
INTRAMUSCULAR | Status: DC | PRN
  Administered 2018-05-12: 2 mg via INTRAVENOUS

## 2018-05-12 MED ORDER — MEPERIDINE HCL 25 MG/ML IJ SOLN
6.2500 mg | INTRAMUSCULAR | Status: DC | PRN
  Filled 2018-05-12: qty 1

## 2018-05-12 MED ORDER — IBUPROFEN 800 MG PO TABS: 800.0000 mg | ORAL_TABLET | Freq: Three times a day (TID) | ORAL | 0 refills | Status: DC | PRN

## 2018-05-12 MED ORDER — ONDANSETRON HCL 4 MG/2ML IJ SOLN
INTRAMUSCULAR | Status: DC | PRN
  Administered 2018-05-12: 4 mg via INTRAVENOUS

## 2018-05-12 MED ORDER — HYDROMORPHONE HCL 1 MG/ML IJ SOLN
0.2500 mg | INTRAMUSCULAR | Status: DC | PRN
  Filled 2018-05-12: qty 0.5

## 2018-05-12 MED ORDER — DEXAMETHASONE SODIUM PHOSPHATE 4 MG/ML IJ SOLN
INTRAMUSCULAR | Status: DC | PRN
  Administered 2018-05-12: 10 mg via INTRAVENOUS

## 2018-05-12 MED ORDER — KETOROLAC TROMETHAMINE 30 MG/ML IJ SOLN
INTRAMUSCULAR | Status: DC | PRN
  Administered 2018-05-12: 30 mg via INTRAVENOUS

## 2018-05-12 MED ORDER — DEXAMETHASONE SODIUM PHOSPHATE 10 MG/ML IJ SOLN
INTRAMUSCULAR | Status: AC
  Filled 2018-05-12: qty 1

## 2018-05-12 MED ORDER — SCOPOLAMINE 1 MG/3DAYS TD PT72
MEDICATED_PATCH | TRANSDERMAL | Status: AC
  Filled 2018-05-12: qty 1

## 2018-05-12 MED ORDER — LIDOCAINE 2% (20 MG/ML) 5 ML SYRINGE
INTRAMUSCULAR | Status: DC | PRN
  Administered 2018-05-12: 100 mg via INTRAVENOUS

## 2018-05-12 MED ORDER — PROPOFOL 10 MG/ML IV BOLUS
INTRAVENOUS | Status: AC
  Filled 2018-05-12: qty 20

## 2018-05-12 MED ORDER — MIDAZOLAM HCL 2 MG/2ML IJ SOLN
INTRAMUSCULAR | Status: AC
  Filled 2018-05-12: qty 2

## 2018-05-12 MED ORDER — TRAMADOL HCL 50 MG PO TABS: 50.0000 mg | ORAL_TABLET | Freq: Four times a day (QID) | ORAL | 0 refills | Status: DC | PRN

## 2018-05-12 MED ORDER — PROPOFOL 10 MG/ML IV BOLUS
INTRAVENOUS | Status: DC | PRN
  Administered 2018-05-12: 200 mg via INTRAVENOUS

## 2018-05-12 MED ORDER — FENTANYL CITRATE (PF) 100 MCG/2ML IJ SOLN
INTRAMUSCULAR | Status: DC | PRN
  Administered 2018-05-12 (×2): 25 ug via INTRAVENOUS

## 2018-05-12 MED ORDER — ONDANSETRON HCL 4 MG/2ML IJ SOLN
4.0000 mg | Freq: Once | INTRAMUSCULAR | Status: DC | PRN
  Filled 2018-05-12: qty 2

## 2018-05-12 MED ORDER — FENTANYL CITRATE (PF) 100 MCG/2ML IJ SOLN
INTRAMUSCULAR | Status: AC
  Filled 2018-05-12: qty 2

## 2018-05-12 MED ORDER — CEFAZOLIN SODIUM-DEXTROSE 2-4 GM/100ML-% IV SOLN
INTRAVENOUS | Status: AC
  Filled 2018-05-12: qty 100

## 2018-05-12 MED ORDER — LACTATED RINGERS IV SOLN
INTRAVENOUS | Status: DC
  Administered 2018-05-12: 10:00:00 via INTRAVENOUS
  Filled 2018-05-12: qty 1000

## 2018-05-12 SURGICAL SUPPLY — 32 items
APPLICATOR COTTON TIP 6 STRL (MISCELLANEOUS) IMPLANT
APPLICATOR COTTON TIP 6IN STRL (MISCELLANEOUS) IMPLANT
CATH ROBINSON RED A/P 16FR (CATHETERS) ×2 IMPLANT
ELECT BALL LEEP 3MM BLK (ELECTRODE) IMPLANT
ELECT BALL LEEP 5MM RED (ELECTRODE) ×2 IMPLANT
ELECT LOOP LEEP RND 10X10 YLW (CUTTING LOOP)
ELECT LOOP LEEP RND 15X12 GRN (CUTTING LOOP)
ELECT LOOP LEEP RND 20X12 WHT (CUTTING LOOP) ×2
ELECT LOOP LEEP SQR 10X10 ORG (CUTTING LOOP) ×2
ELECT REM PT RETURN 9FT ADLT (ELECTROSURGICAL) ×2
ELECTRODE LOOP LP RND 10X10YLW (CUTTING LOOP) IMPLANT
ELECTRODE LOOP LP RND 15X12GRN (CUTTING LOOP) IMPLANT
ELECTRODE LOOP LP RND 20X12WHT (CUTTING LOOP) ×1 IMPLANT
ELECTRODE LOOP LP SQR 10X10ORG (CUTTING LOOP) ×1 IMPLANT
ELECTRODE REM PT RTRN 9FT ADLT (ELECTROSURGICAL) ×1 IMPLANT
EXTENDER ELECT LOOP LEEP 10CM (CUTTING LOOP) IMPLANT
GLOVE BIO SURGEON STRL SZ 6.5 (GLOVE) ×2 IMPLANT
GLOVE BIOGEL PI IND STRL 7.0 (GLOVE) ×1 IMPLANT
GLOVE BIOGEL PI INDICATOR 7.0 (GLOVE) ×1
GLOVE INDICATOR 7.5 STRL GRN (GLOVE) ×6 IMPLANT
GOWN STRL REUS W/TWL LRG LVL3 (GOWN DISPOSABLE) ×4 IMPLANT
NS IRRIG 1000ML POUR BTL (IV SOLUTION) ×2 IMPLANT
PACK VAGINAL MINOR WOMEN LF (CUSTOM PROCEDURE TRAY) ×2 IMPLANT
PAD OB MATERNITY 4.3X12.25 (PERSONAL CARE ITEMS) ×2 IMPLANT
SCOPETTES 8  STERILE (MISCELLANEOUS) ×2
SCOPETTES 8 STERILE (MISCELLANEOUS) ×2 IMPLANT
SCRUB TECHNI CARE 4 OZ NO DYE (MISCELLANEOUS) ×2 IMPLANT
SUT SILK 2 0 SH (SUTURE) ×2 IMPLANT
SUT VIC AB 0 CT2 27 (SUTURE) ×6 IMPLANT
TOWEL OR 17X24 6PK STRL BLUE (TOWEL DISPOSABLE) ×4 IMPLANT
TUBE CONNECTING 12X1/4 (SUCTIONS) ×2 IMPLANT
YANKAUER SUCT BULB TIP NO VENT (SUCTIONS) ×4 IMPLANT

## 2018-05-12 NOTE — Anesthesia Procedure Notes (Signed)
Procedure Name: LMA Insertion Date/Time: 05/12/2018 10:37 AM Performed by: Arta Brucessey, Kevin, MD Pre-anesthesia Checklist: Patient identified, Emergency Drugs available, Suction available and Patient being monitored Patient Re-evaluated:Patient Re-evaluated prior to induction Oxygen Delivery Method: Circle system utilized Preoxygenation: Pre-oxygenation with 100% oxygen Induction Type: IV induction Ventilation: Mask ventilation without difficulty LMA: LMA inserted LMA Size: 4.0 Number of attempts: 1 Airway Equipment and Method: Bite block Placement Confirmation: positive ETCO2 Tube secured with: Tape Dental Injury: Teeth and Oropharynx as per pre-operative assessment

## 2018-05-12 NOTE — Progress Notes (Signed)
Update to History and Physical  No marked change in status since office preop visit. \ No recent infections.  Patient examined.  WBC mildly elevated.  OK to proceed with surgery:  Colposcopy with LEEP conization, ECC.  Risks, benefits, and alternatives discussed with the patient who wishes to proceed.

## 2018-05-12 NOTE — Anesthesia Postprocedure Evaluation (Signed)
Anesthesia Post Note  Patient: Barbara Barr  Procedure(s) Performed: LOOP ELECTROSURGICAL EXCISION PROCEDURE (LEEP) with ECC (N/A Cervix) COLPOSCOPY (N/A Cervix)     Patient location during evaluation: PACU Anesthesia Type: General Level of consciousness: awake and alert Pain management: pain level controlled Vital Signs Assessment: post-procedure vital signs reviewed and stable Respiratory status: spontaneous breathing, nonlabored ventilation, respiratory function stable and patient connected to nasal cannula oxygen Cardiovascular status: blood pressure returned to baseline and stable Postop Assessment: no apparent nausea or vomiting Anesthetic complications: no    Last Vitals:  Vitals:   05/12/18 1245 05/12/18 1309  BP: (!) 116/58 111/64  Pulse: 67 72  Resp: (!) 21   Temp:  36.7 C  SpO2: 100% 98%    Last Pain:  Vitals:   05/12/18 1309  TempSrc: Oral  PainSc:                  Yoana Staib DAVID

## 2018-05-12 NOTE — Op Note (Signed)
OPERATIVE REPORT  PRE-OP DIAGNOSIS:  High grade cervical dysplasia  POST OP DIAGNOSIS:  High grade cervical dysplasia  PROCEDURE:  Colposcopy with LEEP conization and ECC  SURGEON:  Janean SarkBrook Amundson C. Silva, MD  ANESTHESIA:  LMA, local with 10 cc 1% lidocaine with epinepherine 1:100,000  IVF:  800 cc LR  URINE OUTPUT:   Patient voided prior to procedure.   EBL:   5 cc.   COMPLICATIONS:  None.   INDICATIONS FOR PROCEDURE:    The patient is a 42 year old Gravida 2, Para 2 Caucasian female, status post bilateral tubal ligation who presented with a pap showing ASCUS, cannot rule out HGSIL.  Colposcopy was satisfactory and demonstrated HGSIL and a negative ECC.  She has a history of prior cryotherapy and a LEEP procedure.  A plan was made for office LEEP procedure however visualization of the cervix was not adequate due to the patient's anatomy and vaginal sidewalls which partially obstructed the treatment area.    A plan was therefore made to proceed with a LEEP procedure and ECC in an outpatient hospital setting.  Risks, benefits, and alternatives were reviewed with the patient who wished to proceed.   FINDINGS:     Colposcopy was satisfactory and demonstrated acetowhite thickened changes from 8 - 12:00.  There were mosaics at the 3:00 position.  Lugols demonstrated some decreased uptake extending well to the perimeter of the cervix at 6:00 and 9:00 positions.   SPECIMENS:  The LEEP specimen was marked at 12:00 with silk suture and sent to pathology. The endocervical button was marked at 12:00 with silk suture and sent to pathology.  The ECC was also sent to pathology.   PROCEDURE:  The patient was reidentified in the preop hold area.  In the operating room, she received an LMA anesthetic.  She was placed in the dorsal lithology position with [employer] stirrups.  An exam under anesthesia was performed.  A speculum was placed in the vagina. 5% acetic acid was used on the cervix and  colposcopy was performed.  See the findings above.   Lugols was then placed on the cervix to outline the treatment area.  Additional retraction was needed for performing the LEEP procedure.  The vagina and cervix were sterily prepped with Scrubcare.  The patient received Ancef 2 grams IV.   A weighted insulated speculum was placed the the vagina.  A single blade of the insulated Graves speculum was used to retract the vagina anteriorly.  Interrupted sutures of 0/0 Vicryl were placed at 12:00, 3:00, and 9:00 on the peripheral cervix.  The cervix was injected with 10 cc 1% lidocaine with epinepherine 1:100,000.  The sutures were held to give good visualization of the treatment area.  The LEEP was performed with a cutting setting of 50 watts.  The specimen was later marked at the 12:00 position and sent to pathology. An endocervical pass was performed also with a cutting setting of 50 watts.  This specimen was later marked at the 12:00 position and sent to pathology.  The ECC was performed with the Kevorkian curette and sent to pathology.    The cervix was coagulated with the cautery ball on a setting of 40 watts.  The cervical os was avoided with the cauterization.  Hemostasis was good.  Monsel's solution was placed.   There were no complications to the procedure.  All needle, instrument, and sponge counts were correct.  The patient was escorted to the recovery room in stable and  awake condition.   SIGNED:  Janean Sark, MD

## 2018-05-12 NOTE — Discharge Instructions (Signed)
Barbara Barr,   I did place 3 dissolvable sutures in your cervix to help me with retraction and visualization today.  These do not need to be removed.  You did well today with surgery!  Conley SimmondsBrook Silva, MD  Loop Electrosurgical Excision Procedure, Care After Refer to this sheet in the next few weeks. These instructions provide you with information about caring for yourself after your procedure. Your health care provider may also give you more specific instructions. Your treatment has been planned according to current medical practices, but problems sometimes occur. Call your health care provider if you have any problems or questions after your procedure. What can I expect after the procedure? After the procedure, it is common to have:  Abdominal cramps that are similar to menstrual cramps. These may last for up to 1 week.  Pink-tinged or bloody vaginal discharge, including light to moderate bleeding, for 1-2 weeks.  A dark-colored vaginal discharge. This is from the paste that was applied to your cervix to control bleeding.  Follow these instructions at home: Activity  Return to your normal activities as told by your health care provider. Ask your health care provider what activities are safe for you.  Avoid strenuous physical activity for as long as told by your health care provider.  Do not lift anything that is heavier than 10 lb (4.5 kg) until your health care provider says that it is safe. Bathing  Do not take baths, swim, or use a hot tub until your health care provider approves.  You may take showers. Lifestyle  Do not put anything in your vagina for 2 weeks after the procedure or until your health care provider says that it is okay. This includes tampons, creams, and douches.  Do not have sexual intercourse until your health care provider approves. General instructions  Take over-the-counter and prescription medicines only as told by your health care provider.  Keep all  follow-up visits as told by your health care provider. This is important. Contact a health care provider if:  You have a fever or chills.  You feel unusually weak.  You have vaginal bleeding that is heavier or longer than a normal menstrual cycle. A sign of this can be soaking a pad with blood.  You have severe pain.  You have nausea or vomiting.  You develop a bad smelling vaginal discharge. This information is not intended to replace advice given to you by your health care provider. Make sure you discuss any questions you have with your health care provider. Document Released: 06/27/2011 Document Revised: 11/10/2015 Document Reviewed: 08/28/2015 Elsevier Interactive Patient Education  2018 [employer]    Post Anesthesia Home Care Instructions  Activity: Get plenty of rest for the remainder of the day. A responsible individual must stay with you for 24 hours following the procedure.  For the next 24 hours, DO NOT: -Drive a car -Advertising copywriterperate machinery -Drink alcoholic beverages -Take any medication unless instructed by your physician -Make any legal decisions or sign important papers.  Meals: Start with liquid foods such as gelatin or soup. Progress to regular foods as tolerated. Avoid greasy, spicy, heavy foods. If nausea and/or vomiting occur, drink only clear liquids until the nausea and/or vomiting subsides. Call your physician if vomiting continues.  Special Instructions/Symptoms: Your throat may feel dry or sore from the anesthesia or the breathing tube placed in your throat during surgery. If this causes discomfort, gargle with warm salt water. The discomfort should disappear within 24 hours.  If you  had a scopolamine patch placed behind your ear for the management of post- operative nausea and/or vomiting:  1. The medication in the patch is effective for 72 hours, after which it should be removed.  Wrap patch in a tissue and discard in the trash. Wash hands thoroughly  with soap and water. 2. You may remove the patch earlier than 72 hours if you experience unpleasant side effects which may include dry mouth, dizziness or visual disturbances. 3. Avoid touching the patch. Wash your hands with soap and water after contact with the patch.    Post Anesthesia Home Care Instructions  Activity: Get plenty of rest for the remainder of the day. A responsible individual must stay with you for 24 hours following the procedure.  For the next 24 hours, DO NOT: -Drive a car -Advertising copywriter -Drink alcoholic beverages -Take any medication unless instructed by your physician -Make any legal decisions or sign important papers.  Meals: Start with liquid foods such as gelatin or soup. Progress to regular foods as tolerated. Avoid greasy, spicy, heavy foods. If nausea and/or vomiting occur, drink only clear liquids until the nausea and/or vomiting subsides. Call your physician if vomiting continues.  Special Instructions/Symptoms: Your throat may feel dry or sore from the anesthesia or the breathing tube placed in your throat during surgery. If this causes discomfort, gargle with warm salt water. The discomfort should disappear within 24 hours.  If you had a scopolamine patch placed behind your ear for the management of post- operative nausea and/or vomiting:  1. The medication in the patch is effective for 72 hours, after which it should be removed.  Wrap patch in a tissue and discard in the trash. Wash hands thoroughly with soap and water. 2. You may remove the patch earlier than 72 hours if you experience unpleasant side effects which may include dry mouth, dizziness or visual disturbances. 3. Avoid touching the patch. Wash your hands with soap and water after contact with the patch.

## 2018-05-12 NOTE — Transfer of Care (Signed)
Immediate Anesthesia Transfer of Care Note  Patient: Barbara NipMelissa Ann Faulds  Procedure(s) Performed: LOOP ELECTROSURGICAL EXCISION PROCEDURE (LEEP) with ECC (N/A Cervix) COLPOSCOPY (N/A Cervix)  Patient Location: PACU  Anesthesia Type:General  Level of Consciousness: awake, alert , oriented and patient cooperative  Airway & Oxygen Therapy: Patient Spontanous Breathing and Patient connected to nasal cannula oxygen  Post-op Assessment: Report given to RN and Post -op Vital signs reviewed and stable  Post vital signs: Reviewed and stable  Last Vitals:  Vitals Value Taken Time  BP 108/56 05/12/2018 12:00 PM  Temp 36.7 C 05/12/2018 12:00 PM  Pulse 81 05/12/2018 12:01 PM  Resp 18 05/12/2018 12:02 PM  SpO2 100 % 05/12/2018 12:01 PM  Vitals shown include unvalidated device data.  Last Pain:  Vitals:   05/12/18 0935  TempSrc:   PainSc: 0-No pain      Patients Stated Pain Goal: 8 (05/12/18 0935)  Complications: No apparent anesthesia complications

## 2018-05-12 NOTE — Anesthesia Preprocedure Evaluation (Signed)
Anesthesia Evaluation  Patient identified by MRN, date of birth, ID band Patient awake    Reviewed: Allergy & Precautions, NPO status , Patient's Chart, lab work & pertinent test results  History of Anesthesia Complications (+) PONV  Airway Mallampati: I  TM Distance: >3 FB Neck ROM: Full    Dental   Pulmonary Current Smoker,    Pulmonary exam normal        Cardiovascular Normal cardiovascular exam     Neuro/Psych Anxiety Depression    GI/Hepatic   Endo/Other    Renal/GU      Musculoskeletal   Abdominal   Peds  Hematology   Anesthesia Other Findings   Reproductive/Obstetrics                             Anesthesia Physical Anesthesia Plan  ASA: II  Anesthesia Plan: General   Post-op Pain Management:    Induction: Intravenous  PONV Risk Score and Plan: 3 and Scopolamine patch - Pre-op, Dexamethasone and Ondansetron  Airway Management Planned: LMA  Additional Equipment:   Intra-op Plan:   Post-operative Plan: Extubation in OR  Informed Consent: I have reviewed the patients History and Physical, chart, labs and discussed the procedure including the risks, benefits and alternatives for the proposed anesthesia with the patient or authorized representative who has indicated his/her understanding and acceptance.     Plan Discussed with: CRNA and Surgeon  Anesthesia Plan Comments:         Anesthesia Quick Evaluation

## 2018-05-13 ENCOUNTER — Other Ambulatory Visit: Payer: Self-pay | Admitting: Family Medicine

## 2018-05-13 ENCOUNTER — Encounter (HOSPITAL_BASED_OUTPATIENT_CLINIC_OR_DEPARTMENT_OTHER): Payer: Self-pay | Admitting: Obstetrics and Gynecology

## 2018-05-13 DIAGNOSIS — F17200 Nicotine dependence, unspecified, uncomplicated: Secondary | ICD-10-CM

## 2018-05-13 DIAGNOSIS — E6609 Other obesity due to excess calories: Secondary | ICD-10-CM

## 2018-05-13 DIAGNOSIS — Z6836 Body mass index (BMI) 36.0-36.9, adult: Secondary | ICD-10-CM

## 2018-05-15 ENCOUNTER — Telehealth: Payer: Self-pay | Admitting: Emergency Medicine

## 2018-05-15 NOTE — Telephone Encounter (Signed)
Spoke with patient and she is given message from Dr. Edward JollySilva.  Verbalized understanding of plan of care and results thus far.  She confirmed post op appointment date and will call back with any concerns.  Encounter closed.

## 2018-05-15 NOTE — Telephone Encounter (Signed)
Message left to return call to [place] at [phone] on mobile. Okay to leave detailed message per designated party release form. Advised please return call with non-urgent message from Dr. Edward JollySilva.

## 2018-05-15 NOTE — Telephone Encounter (Signed)
Patient returning call to [place]. °

## 2018-05-15 NOTE — Telephone Encounter (Signed)
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  There is no cancer.  The pathologist reported a positive internal margin (deep inside).  The endocervical curettage was negative for abnormal cells.  I will see her back in 4 weeks to discuss options for care due to the positive margin:  repeat pap and ECC in 4 months, repeat surgical excision of abnormal cells with cold knife conization, or hysterectomy. I hope her recovery is going well thus far.

## 2018-05-18 NOTE — Telephone Encounter (Signed)
Bupropion was prescribed for smoking cessation in 10/2017, she was supposed to complete 11 weeks.   Thanks, BJ

## 2018-05-19 ENCOUNTER — Other Ambulatory Visit: Payer: Self-pay | Admitting: *Deleted

## 2018-05-19 DIAGNOSIS — R0683 Snoring: Secondary | ICD-10-CM

## 2018-05-20 ENCOUNTER — Telehealth: Payer: Self-pay | Admitting: Family Medicine

## 2018-05-20 NOTE — Telephone Encounter (Signed)
Copied from CRM [phone]. Topic: General - Other >> May 20, 2018  3:59 PM Gaynelle AduPoole, Shalonda wrote: Reason for CRM:  Patient is requesting an call back in regards to her medication. Stated Marga MelnickQuaneisha Jones, left her a vm. Please advise

## 2018-05-22 NOTE — Telephone Encounter (Signed)
Spoke with patient on 05/20/18 and informed her that medication was sent to pharmacy and that she needed to schedule a follow-up to discuss staying on medication because she was only supposed to be on medication for 11 weeks and then stop. Patient verbalized understanding.

## 2018-06-11 ENCOUNTER — Ambulatory Visit: Payer: 59 | Admitting: Obstetrics and Gynecology

## 2018-06-17 ENCOUNTER — Ambulatory Visit (INDEPENDENT_AMBULATORY_CARE_PROVIDER_SITE_OTHER): Payer: 59 | Admitting: Obstetrics and Gynecology

## 2018-06-17 ENCOUNTER — Encounter: Payer: Self-pay | Admitting: Obstetrics and Gynecology

## 2018-06-17 VITALS — BP 102/60 | HR 76 | Resp 14 | Ht 64.75 in | Wt 204.0 lb

## 2018-06-17 DIAGNOSIS — D069 Carcinoma in situ of cervix, unspecified: Secondary | ICD-10-CM | POA: Diagnosis not present

## 2018-06-17 DIAGNOSIS — R350 Frequency of micturition: Secondary | ICD-10-CM

## 2018-06-17 DIAGNOSIS — R8290 Unspecified abnormal findings in urine: Secondary | ICD-10-CM

## 2018-06-17 DIAGNOSIS — D72829 Elevated white blood cell count, unspecified: Secondary | ICD-10-CM | POA: Diagnosis not present

## 2018-06-17 LAB — POCT URINALYSIS DIPSTICK
Bilirubin, UA: NEGATIVE
Glucose, UA: NEGATIVE
Ketones, UA: NEGATIVE
LEUKOCYTES UA: NEGATIVE
NITRITE UA: NEGATIVE
PH UA: 5 (ref 5.0–8.0)
PROTEIN UA: NEGATIVE
Urobilinogen, UA: 0.2 E.U./dL

## 2018-06-17 NOTE — Progress Notes (Signed)
GYNECOLOGY  VISIT   HPI: 42 y.o.   Married  Caucasian  female   G2P2 with Patient's last menstrual period was 06/10/2018.   here for 1 month post op; patient complains of having frequent urination and painful periods since surgery. No pain with urination.  Had pain with her last menses and was heavier than usual.  Asking about sexual functioning after hysterectomy.   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  ECC negative.   Declines childbearing.   Works as an Airline pilot.   GYNECOLOGIC HISTORY: Patient's last menstrual period was 06/10/2018. Contraception:  Tubal Ligation Menopausal hormone therapy:  none Last mammogram:  none Last pap smear: 05/12/18 LEEP showed high grade dysplasia (CIN 2/3); 04/03/18 Colposcopy showed high grade dysplasia; 03/10/18 ASC-H         OB History    Gravida  2   Para  2   Term      Preterm      AB      Living        SAB      TAB      Ectopic      Multiple      Live Births                 Patient Active Problem List   Diagnosis Date Noted  . Snoring 04/07/2018  . Tobacco abuse 04/07/2018  . Pap smear of cervix with ASCUS, cannot exclude HGSIL 04/03/2018  . Class 2 obesity with body mass index (BMI) of 36.0 to 36.9 in adult 11/10/2017  . Allergic rhinitis 11/10/2017    Past Medical History:  Diagnosis Date  . ADHD (attention deficit hyperactivity disorder)   . Allergic rhinitis, seasonal   . Anemia   . Anxiety   . Cervical dysplasia    high grade  . Depression   . Environmental allergies   . Headache   . History of gestational diabetes   . Obsessive-compulsive disorder   . PONV (postoperative nausea and vomiting)   . Pre-diabetes   . Wears glasses     Past Surgical History:  Procedure Laterality Date  . COLPOSCOPY N/A 05/12/2018   Procedure:  COLPOSCOPY;  Surgeon: Patton Salles, MD;  Location: [facility];  Service: Gynecology;  Laterality: N/A;  . LAPAROSCOPIC CHOLECYSTECTOMY  2003  . LEEP N/A 05/12/2018   Procedure: LOOP ELECTROSURGICAL EXCISION PROCEDURE (LEEP) with ECC;  Surgeon: Patton Salles, MD;  Location: [facility];  Service: Gynecology;  Laterality: N/A;  with ECC  . RHINOPLASTY  02/2018  . TUBAL LIGATION Bilateral 2006    Current Outpatient Medications  Medication Sig Dispense Refill  . buPROPion (WELLBUTRIN SR) 150 MG 12 hr tablet Continue 1 tab daily for 2 weeks,then 1 tab every other day for 1 week, and every 2 days for a week then stop. 60 tablet 0  . fluticasone (FLONASE) 50 MCG/ACT nasal spray Place 1 spray into both nostrils 2 (two) times daily. 16 g 3  . Glycerin-Hypromellose-PEG 400 ([facility] DRY EYE RELIEF OP) Apply to eye as needed.     No current facility-administered medications for this visit.      ALLERGIES: Lexapro [escitalopram]; Penicillins; Benadryl [diphenhydramine]; and Codeine  Family History  Problem Relation Age of Onset  . Dementia Maternal Aunt   .  ADD / ADHD Maternal Uncle   . Stroke Maternal Uncle   . Seizures Mother   . Bipolar disorder Mother   . Cancer Maternal Aunt   . Cervical cancer Maternal Aunt   . Cancer Maternal Aunt   . Heart attack Maternal Uncle     Social History   Socioeconomic History  . Marital status: Married    Spouse name: Not on file  . Number of children: Not on file  . Years of education: Not on file  . Highest education level: Not on file  Occupational History  . Not on file  Social Needs  . Financial resource strain: Not on file  . Food insecurity:    Worry: Not on file    Inability: Not on file  . Transportation needs:    Medical: Not on file    Non-medical: Not on file  Tobacco Use  . Smoking status: Current Every Day Smoker    Packs/day: 0.50    Years: 25.00    Pack years: 12.50     Types: Cigarettes  . Smokeless tobacco: Never Used  Substance and Sexual Activity  . Alcohol use: No  . Drug use: No  . Sexual activity: Yes    Birth control/protection: Surgical  Lifestyle  . Physical activity:    Days per week: Not on file    Minutes per session: Not on file  . Stress: Not on file  Relationships  . Social connections:    Talks on phone: Not on file    Gets together: Not on file    Attends religious service: Not on file    Active member of club or organization: Not on file    Attends meetings of clubs or organizations: Not on file    Relationship status: Not on file  . Intimate partner violence:    Fear of current or ex partner: Not on file    Emotionally abused: Not on file    Physically abused: Not on file    Forced sexual activity: Not on file  Other Topics Concern  . Not on file  Social History Narrative  . Not on file    Review of Systems  Genitourinary: Positive for frequency and urgency.       Painful periods Night urination  All other systems reviewed and are negative.   PHYSICAL EXAMINATION:    BP 102/60 (BP Location: Right Arm, Patient Position: Sitting, Cuff Size: Normal)   Pulse 76   Resp 14   Ht 5' 4.75" (1.645 m)   Wt 204 lb (92.5 kg)   LMP 06/10/2018   BMI 34.21 kg/m     General appearance: alert, cooperative and appears stated age   Pelvic: External genitalia:  no lesions              Urethra:  normal appearing urethra with no masses, tenderness or lesions              Bartholins and Skenes: normal                 Vagina: normal appearing vagina with normal color and discharge, no lesions              Cervix: no lesions                Bimanual Exam:  Uterus:  normal size, contour, position, consistency, mobility, mildly tender.              Adnexa: no mass, fullness, tenderness  Chaperone was present for exam.  ASSESSMENT  Status post LEEP x 3.  CIN III - positive margin.  Recent dysmenorrhea and menorrhagia  with last cycle. Hx elevated WBC.   PLAN  We discussed options for care for the cervical dysplasia with a positive margin - Pap and ECC in 3 months, re-excision with cold knife conization, or total laparoscopic hysterectomy with bilateral salpingectomy, cystoscopy.   Risks of hysterectomy include but are not limited to bleeding, infection, damage to surrounding organs, pneumonia, reaction to anesthesia, DVT, PE, death, need for reoperation, continue abnormal vaginal cytology and need for colposcopy/biopsy/treatment of vaginal dysplasia.  ACOG HO on hysterectomy to patient.  If hysterectomy is chosen, she will need a pelvic ultrasound done first.  Will plan for pap and ECC in November, 2019 at a minimum.   Check CBC with diff.   An After Visit Summary was printed and given to the patient.  __25____ minutes face to face time of which over 50% was spent in counseling.

## 2018-06-18 ENCOUNTER — Telehealth: Payer: Self-pay | Admitting: Obstetrics and Gynecology

## 2018-06-18 DIAGNOSIS — D72820 Lymphocytosis (symptomatic): Secondary | ICD-10-CM

## 2018-06-18 LAB — CBC WITH DIFFERENTIAL/PLATELET
Basophils Absolute: 0 10*3/uL (ref 0.0–0.2)
Basos: 0 %
EOS (ABSOLUTE): 0.1 10*3/uL (ref 0.0–0.4)
EOS: 1 %
HEMATOCRIT: 39.6 % (ref 34.0–46.6)
Hemoglobin: 13.2 g/dL (ref 11.1–15.9)
Immature Grans (Abs): 0 10*3/uL (ref 0.0–0.1)
Immature Granulocytes: 0 %
LYMPHS ABS: 3.2 10*3/uL — AB (ref 0.7–3.1)
Lymphs: 31 %
MCH: 28.4 pg (ref 26.6–33.0)
MCHC: 33.3 g/dL (ref 31.5–35.7)
MCV: 85 fL (ref 79–97)
MONOS ABS: 0.7 10*3/uL (ref 0.1–0.9)
Monocytes: 7 %
Neutrophils Absolute: 6.2 10*3/uL (ref 1.4–7.0)
Neutrophils: 61 %
Platelets: 317 10*3/uL (ref 150–450)
RBC: 4.64 x10E6/uL (ref 3.77–5.28)
RDW: 13.9 % (ref 12.3–15.4)
WBC: 10.3 10*3/uL (ref 3.4–10.8)

## 2018-06-18 NOTE — Telephone Encounter (Signed)
Please contact patient in follow up to her visit yesterday.   I rechecked her WBC and this was normal.  The break down of the white cells on the differential showed minimally increased lymphocytes.  This should be rechecked in the future.   Please schedule a pap and ECC with me in November.  Patient can have a recheck of her blood counts then.

## 2018-06-18 NOTE — Telephone Encounter (Signed)
Call to patient. Results reviewed with patient as seen below from Dr. Edward JollySilva and she verbalized understanding. 3 month pap and ECC scheduled for 09-17-18 at 1545. Patient agreeable to date and time of appointment. Aware labs will be rechecked at that appointment and agreeable. 06 recall placed for 08/2018.  Routing to provider for final review. Patient agreeable to disposition. Will close encounter.

## 2018-06-18 NOTE — Telephone Encounter (Signed)
Patient needs to schedule a 3 month pap with ECC due 09/17/18.

## 2018-06-22 LAB — URINALYSIS, MICROSCOPIC ONLY

## 2018-06-22 LAB — URINE CULTURE

## 2018-09-17 ENCOUNTER — Encounter: Payer: 59 | Admitting: Obstetrics and Gynecology

## 2018-09-17 ENCOUNTER — Telehealth: Payer: Self-pay | Admitting: Obstetrics and Gynecology

## 2018-09-17 NOTE — Telephone Encounter (Signed)
Thank you for the update.  Encounter closed. 

## 2018-09-17 NOTE — Progress Notes (Signed)
Opened in error

## 2018-09-17 NOTE — Telephone Encounter (Signed)
Patient left after waiting for her 3:45 appointment because she had to pick up her children. Rescheduled to 12/27 at 8:00.

## 2018-10-19 NOTE — Progress Notes (Deleted)
GYNECOLOGY  VISIT   HPI: 42 y.o.   Married  Caucasian  female   G2P2 with No LMP recorded.   here for repeat pap and ECC.  GYNECOLOGIC HISTORY: No LMP recorded. 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 Snoring 04/07/2018  . Tobacco abuse 04/07/2018  . Pap smear of cervix with ASCUS, cannot exclude HGSIL 04/03/2018  . Class 2 obesity with body mass index (BMI) of 36.0 to 36.9 in adult 11/10/2017  . Allergic rhinitis 11/10/2017    Past Medical History:  Diagnosis Date  . ADHD (attention deficit hyperactivity disorder)   . Allergic rhinitis, seasonal   . Anemia   . Anxiety   . Cervical dysplasia    high grade  . Depression   . Environmental allergies   . Headache   . History of gestational diabetes   . Obsessive-compulsive disorder   . PONV (postoperative nausea and vomiting)   . Pre-diabetes   . Wears glasses     Past Surgical History:  Procedure Laterality Date  . COLPOSCOPY N/A 05/12/2018   Procedure: COLPOSCOPY;  Surgeon: Patton Salles, MD;  Location: [facility];  Service: Gynecology;  Laterality: N/A;  . LAPAROSCOPIC CHOLECYSTECTOMY  2003  . LEEP N/A 05/12/2018   Procedure: LOOP ELECTROSURGICAL EXCISION PROCEDURE (LEEP) with ECC;  Surgeon: Patton Salles, MD;  Location: [facility];  Service: Gynecology;  Laterality: N/A;  with ECC  . RHINOPLASTY  02/2018  . TUBAL LIGATION Bilateral 2006    Current Outpatient Medications  Medication Sig Dispense Refill  . buPROPion (WELLBUTRIN SR) 150 MG 12  hr tablet Continue 1 tab daily for 2 weeks,then 1 tab every other day for 1 week, and every 2 days for a week then stop. 60 tablet 0  . fluticasone (FLONASE) 50 MCG/ACT nasal spray Place 1 spray into both nostrils 2 (two) times daily. 16 g 3  . Glycerin-Hypromellose-PEG 400 ([employer] DRY EYE RELIEF OP) Apply to eye as needed.     No current facility-administered medications for this visit.      ALLERGIES: Lexapro [escitalopram]; Penicillins; Benadryl [diphenhydramine]; and Codeine  Family History  Problem Relation Age of Onset  . Dementia Maternal Aunt   . ADD / ADHD Maternal Uncle   . Stroke Maternal Uncle   . Seizures Mother   . Bipolar disorder Mother   . Cancer Maternal Aunt   . Cervical cancer Maternal Aunt   . Cancer Maternal Aunt   . Heart attack Maternal Uncle     Social History   Socioeconomic History  . Marital status: Married    Spouse name: Not on file  . Number of children: Not on file  . Years of education: Not on  file  . Highest education level: Not on file  Occupational History  . Not on file  Social Needs  . Financial resource strain: Not on file  . Food insecurity:    Worry: Not on file    Inability: Not on file  . Transportation needs:    Medical: Not on file    Non-medical: Not on file  Tobacco Use  . Smoking status: Current Every Day Smoker    Packs/day: 0.50    Years: 25.00    Pack years: 12.50    Types: Cigarettes  . Smokeless tobacco: Never Used  Substance and Sexual Activity  . Alcohol use: No  . Drug use: No  . Sexual activity: Yes    Birth control/protection: Surgical  Lifestyle  . Physical activity:    Days per week: Not on file    Minutes per session: Not on file  . Stress: Not on file  Relationships  . Social connections:    Talks on phone: Not on file    Gets together: Not on file    Attends religious service: Not on file    Active member of club or organization: Not on file    Attends meetings of clubs or organizations: Not on  file    Relationship status: Not on file  . Intimate partner violence:    Fear of current or ex partner: Not on file    Emotionally abused: Not on file    Physically abused: Not on file    Forced sexual activity: Not on file  Other Topics Concern  . Not on file  Social History Narrative  . Not on file    Review of Systems  PHYSICAL EXAMINATION:    There were no vitals taken for this visit.    General appearance: alert, cooperative and appears stated age Head: Normocephalic, without obvious abnormality, atraumatic Neck: no adenopathy, supple, symmetrical, trachea midline and thyroid normal to inspection and palpation Lungs: clear to auscultation bilaterally Breasts: normal appearance, no masses or tenderness, No nipple retraction or dimpling, No nipple discharge or bleeding, No axillary or supraclavicular adenopathy Heart: regular rate and rhythm Abdomen: soft, non-tender, no masses,  no organomegaly Extremities: extremities normal, atraumatic, no cyanosis or edema Skin: Skin color, texture, turgor normal. No rashes or lesions Lymph nodes: Cervical, supraclavicular, and axillary nodes normal. No abnormal inguinal nodes palpated Neurologic: Grossly normal  Pelvic: External genitalia:  no lesions              Urethra:  normal appearing urethra with no masses, tenderness or lesions              Bartholins and Skenes: normal                 Vagina: normal appearing vagina with normal color and discharge, no lesions              Cervix: no lesions                Bimanual Exam:  Uterus:  normal size, contour, position, consistency, mobility, non-tender              Adnexa: no mass, fullness, tenderness              Rectal exam: {yes no:314532}.  Confirms.              Anus:  normal sphincter tone, no lesions  Chaperone was present for exam.  ASSESSMENT     PLAN  An After Visit Summary was printed and given to the patient.  ______ minutes face to face time of which  over 50% was spent in counseling.

## 2018-10-22 DIAGNOSIS — F909 Attention-deficit hyperactivity disorder, unspecified type: Secondary | ICD-10-CM | POA: Insufficient documentation

## 2018-10-22 DIAGNOSIS — F988 Other specified behavioral and emotional disorders with onset usually occurring in childhood and adolescence: Secondary | ICD-10-CM | POA: Insufficient documentation

## 2018-10-23 ENCOUNTER — Ambulatory Visit: Payer: 59 | Admitting: Obstetrics and Gynecology

## 2018-10-30 ENCOUNTER — Ambulatory Visit (INDEPENDENT_AMBULATORY_CARE_PROVIDER_SITE_OTHER): Payer: 59 | Admitting: Obstetrics and Gynecology

## 2018-10-30 ENCOUNTER — Encounter: Payer: Self-pay | Admitting: Obstetrics and Gynecology

## 2018-10-30 ENCOUNTER — Other Ambulatory Visit (HOSPITAL_COMMUNITY)
Admission: RE | Admit: 2018-10-30 | Discharge: 2018-10-30 | Disposition: A | Discharge status: Home / Self Care | Payer: 59 | Source: Ambulatory Visit | Attending: Obstetrics and Gynecology | Admitting: Obstetrics and Gynecology

## 2018-10-30 VITALS — BP 114/78 | HR 80 | Resp 16 | Ht 65.0 in | Wt 196.0 lb

## 2018-10-30 DIAGNOSIS — D72829 Elevated white blood cell count, unspecified: Secondary | ICD-10-CM

## 2018-10-30 DIAGNOSIS — N946 Dysmenorrhea, unspecified: Secondary | ICD-10-CM | POA: Diagnosis not present

## 2018-10-30 DIAGNOSIS — D069 Carcinoma in situ of cervix, unspecified: Secondary | ICD-10-CM | POA: Diagnosis not present

## 2018-10-30 NOTE — Progress Notes (Signed)
GYNECOLOGY  VISIT   HPI: 43 y.o.   Married  Caucasian  female   G2P2 with Patient's last menstrual period was 10/20/2018.   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 ECC negative.   Having severe cramping with her menses.  Twice a year has 2 menses per month.  Declines future childbearing.   Quitting smoking. 3 cig per day.  Had prediabetes.  A1C 5.7.  Bought a house.   GYNECOLOGIC HISTORY: Patient's last menstrual period was 10/20/2018. 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 Snoring 04/07/2018  . Tobacco abuse 04/07/2018  . Pap smear of cervix with ASCUS, cannot exclude HGSIL 04/03/2018  . Class 2 obesity with body mass index (BMI) of 36.0 to 36.9 in adult 11/10/2017  . Allergic rhinitis 11/10/2017    Past Medical History:  Diagnosis Date  . ADHD (attention deficit hyperactivity disorder)   . Allergic rhinitis, seasonal   . Anemia   . Anxiety   . Cervical dysplasia    high grade  . Depression   . Environmental allergies   . Headache   . History of gestational diabetes   . Obsessive-compulsive disorder   . PONV (postoperative nausea and vomiting)   . Pre-diabetes   . Wears glasses     Past Surgical History:  Procedure Laterality Date  . COLPOSCOPY N/A 05/12/2018   Procedure: COLPOSCOPY;  Surgeon: [name], Brook E, MD;  [facility] [place];  Service: Gynecology;  Laterality: N/A;  . LAPAROSCOPIC CHOLECYSTECTOMY  2003  . LEEP N/A 05/12/2018   Procedure: LOOP ELECTROSURGICAL  EXCISION PROCEDURE (LEEP) with ECC;  Surgeon: [name], Brook E, MD;  [facility] [place];  Service: Gynecology;  Laterality: N/A;  with ECC  . RHINOPLASTY  02/2018  . TUBAL LIGATION Bilateral 2006    Current Outpatient Medications  Medication Sig Dispense Refill  . amphetamine-dextroamphetamine (ADDERALL) 30 MG tablet Take 1 tablet by mouth 2 (two) times daily.    . fluticasone (FLONASE) 50 MCG/ACT nasal spray Place 1 spray into both nostrils 2 (two) times daily. 16 g 3  . Glycerin-Hypromellose-PEG 400 ([employer] DRY EYE RELIEF OP) Apply to eye as needed.     No current facility-administered medications for this visit.      ALLERGIES: Lexapro [escitalopram]; Penicillins; Benadryl [diphenhydramine]; and Codeine  Family History  Problem Relation Age of Onset  . Dementia Maternal Aunt   . ADD / ADHD Maternal Uncle   . Stroke Maternal Uncle   . Seizures Mother   . Bipolar disorder Mother   . Cancer Maternal Aunt   . Cervical cancer Maternal Aunt   . Cancer Maternal Aunt   . Heart  attack Maternal Uncle     Social History   Socioeconomic History  . Marital status: Married    Spouse name: Not on file  . Number of children: Not on file  . Years of education: Not on file  . Highest education level: Not on file  Occupational History  . Not on file  Social Needs  . Financial resource strain: Not on file  . Food insecurity:    Worry: Not on file    Inability: Not on file  . Transportation needs:    Medical: Not on file    Non-medical: Not on file  Tobacco Use  . Smoking status: Current Every Day Smoker    Packs/day: 0.50    Years: 25.00    Pack years: 12.50    Types: Cigarettes  . Smokeless tobacco: Never Used  Substance and Sexual Activity  . Alcohol use: No  . Drug use: No  . Sexual activity: Yes    Birth control/protection: Surgical  Lifestyle  . Physical activity:    Days per week: Not on file    Minutes per session: Not on file  . Stress:  Not on file  Relationships  . Social connections:    Talks on phone: Not on file    Gets together: Not on file    Attends religious service: Not on file    Active member of club or organization: Not on file    Attends meetings of clubs or organizations: Not on file    Relationship status: Not on file  . Intimate partner violence:    Fear of current or ex partner: Not on file    Emotionally abused: Not on file    Physically abused: Not on file    Forced sexual activity: Not on file  Other Topics Concern  . Not on file  Social History Narrative  . Not on file    Review of Systems  Constitutional: Negative.   HENT: Negative.   Eyes: Negative.   Respiratory: Negative.   Cardiovascular: Negative.   Gastrointestinal: Negative.   Endocrine: Negative.   Genitourinary:       Menstrual cramps Lower back pain  Musculoskeletal: Negative.   Skin: Negative.   Allergic/Immunologic: Negative.   Neurological: Negative.   Hematological: Negative.   Psychiatric/Behavioral: Negative.     PHYSICAL EXAMINATION:    BP 114/78 (BP Location: Right Arm, Patient Position: Sitting, Cuff Size: Large)   Pulse 80   Resp 16   Ht 5\' 5"  (1.651 m)   Wt 196 lb (88.9 kg)   LMP 10/20/2018   BMI 32.62 kg/m     General appearance: alert, cooperative and appears stated age   Pelvic: External genitalia:  no lesions              Urethra:  normal appearing urethra with no masses, tenderness or lesions              Bartholins and Skenes: normal                 Vagina: normal appearing vagina with normal color and discharge, no lesions              Cervix: no lesions                Bimanual Exam:  Uterus:  normal size, contour, position, consistency, mobility, non-tender              Adnexa: no mass, fullness, tenderness  Chaperone was present for exam.  ASSESSMENT  CIN III.  Status post LEEP with positive margin.  Dysmenorrhea.  Elevated WBC.  Smoker.  Mildly elevated A1C.     PLAN  Pap and HR HPV.  ECC.  We discussed management of her positive margin - repeat pap/ECC, repeat excision with conization, and hysterectomy with bilateral salpingectomy.  She will return for pelvic US.   She is favoring future hysterectomy.  She already has an Teacher, adult education on this. CBC with diff today.  We discussed the importance of smoking cessation.    An After Visit Summary was printed and given to the patient.  __25___ minutes face to face time of which over 50% was spent in counseling.

## 2018-10-31 LAB — CBC WITH DIFFERENTIAL/PLATELET
BASOS ABS: 0 10*3/uL (ref 0.0–0.2)
Basos: 0 %
EOS (ABSOLUTE): 0.1 10*3/uL (ref 0.0–0.4)
Eos: 1 %
Hematocrit: 38.1 % (ref 34.0–46.6)
Hemoglobin: 12.8 g/dL (ref 11.1–15.9)
Immature Grans (Abs): 0 10*3/uL (ref 0.0–0.1)
Immature Granulocytes: 0 %
LYMPHS ABS: 3.5 10*3/uL — AB (ref 0.7–3.1)
LYMPHS: 33 %
MCH: 28.5 pg (ref 26.6–33.0)
MCHC: 33.6 g/dL (ref 31.5–35.7)
MCV: 85 fL (ref 79–97)
MONOCYTES: 6 %
Monocytes Absolute: 0.6 10*3/uL (ref 0.1–0.9)
NEUTROS ABS: 6.3 10*3/uL (ref 1.4–7.0)
NEUTROS PCT: 60 %
PLATELETS: 356 10*3/uL (ref 150–450)
RBC: 4.49 x10E6/uL (ref 3.77–5.28)
RDW: 12.9 % (ref 12.3–15.4)
WBC: 10.5 10*3/uL (ref 3.4–10.8)

## 2018-11-05 ENCOUNTER — Ambulatory Visit: Payer: 59 | Admitting: Obstetrics and Gynecology

## 2018-11-05 ENCOUNTER — Encounter: Payer: Self-pay | Admitting: Obstetrics and Gynecology

## 2018-11-05 ENCOUNTER — Ambulatory Visit (INDEPENDENT_AMBULATORY_CARE_PROVIDER_SITE_OTHER): Payer: 59

## 2018-11-05 VITALS — BP 110/64 | HR 72 | Resp 14 | Ht 64.75 in | Wt 198.6 lb

## 2018-11-05 DIAGNOSIS — D069 Carcinoma in situ of cervix, unspecified: Secondary | ICD-10-CM | POA: Diagnosis not present

## 2018-11-05 DIAGNOSIS — N946 Dysmenorrhea, unspecified: Secondary | ICD-10-CM | POA: Diagnosis not present

## 2018-11-05 LAB — CYTOLOGY - PAP
Diagnosis: NEGATIVE
HPV: NOT DETECTED

## 2018-11-05 MED ORDER — IBUPROFEN 800 MG PO TABS: 800.0000 mg | ORAL_TABLET | Freq: Three times a day (TID) | ORAL | 2 refills | Status: DC | PRN

## 2018-11-05 NOTE — Progress Notes (Signed)
GYNECOLOGY  VISIT   HPI: 43 y.o.   Married  Caucasian  female   G2P2 with Patient's last menstrual period was 10/20/2018.   here for pelvic ultrasound for dysmenorrhea.   She has severe cramping with her cycles.  Menses 2 times a month twice per year.  Has pain with walking at times during her cycle, due to pain.  Pain starts one week prior to menses, occurs during menses, and continues one week after cycle.  Being following for positive margin on LEEP. Pap and ECC are pending.   States that intercourse is painful following LEEP.   Smoker.  Prediabetes.   Considering hysterectomy.  Has completed childbearing.   She does prefer not to be out of work.  GYNECOLOGIC HISTORY: Patient's last menstrual period was 10/20/2018. 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 Snoring 04/07/2018  . Tobacco abuse 04/07/2018  . Pap smear of cervix with ASCUS, cannot exclude HGSIL 04/03/2018  . Class 2 obesity with body mass index (BMI) of 36.0 to 36.9 in adult 11/10/2017  . Allergic rhinitis 11/10/2017    Past Medical History:  Diagnosis Date  . ADHD (attention deficit hyperactivity disorder)   . Allergic rhinitis, seasonal   . Anemia   . Anxiety   . Cervical dysplasia    high grade  . Depression   . Environmental allergies   . Headache   . History of gestational diabetes   . Obsessive-compulsive disorder   . PONV (postoperative nausea and vomiting)   . Pre-diabetes   . Wears glasses     Past Surgical History:  Procedure Laterality Date  . COLPOSCOPY N/A  05/12/2018   Procedure: COLPOSCOPY;  Surgeon: [name], Daylynn Stumpp E, MD;  [facility] [employer] [facility];  Service: Gynecology;  Laterality: N/A;  . LAPAROSCOPIC CHOLECYSTECTOMY  2003  . LEEP N/A 05/12/2018   Procedure: LOOP ELECTROSURGICAL EXCISION PROCEDURE (LEEP) with ECC;  Surgeon: [name], Yavier Snider E, MD;  [facility] [place];  Service: Gynecology;  Laterality: N/A;  with ECC  . RHINOPLASTY  02/2018  . TUBAL LIGATION Bilateral 2006    Current Outpatient Medications  Medication Sig Dispense Refill  . amphetamine-dextroamphetamine (ADDERALL) 30 MG tablet Take 1 tablet by mouth 2 (two) times daily.    . fluticasone (FLONASE) 50 MCG/ACT nasal spray Place 1 spray into both nostrils 2 (two) times daily. 16 g 3  . Glycerin-Hypromellose-PEG 400 ([employer] DRY EYE RELIEF OP) Apply to eye as needed.     No current facility-administered medications for this visit.      ALLERGIES: Lexapro [escitalopram]; Penicillins; Benadryl [diphenhydramine]; and Codeine  Family History  Problem Relation Age of Onset  . Dementia Maternal Aunt   . ADD / ADHD Maternal Uncle   .  Stroke Maternal Uncle   . Seizures Mother   . Bipolar disorder Mother   . Cancer Maternal Aunt   . Cervical cancer Maternal Aunt   . Cancer Maternal Aunt   . Heart attack Maternal Uncle     Social History   Socioeconomic History  . Marital status: Married    Spouse name: Not on file  . Number of children: Not on file  . Years of education: Not on file  . Highest education level: Not on file  Occupational History  . Not on file  Social Needs  . Financial resource strain: Not on file  . Food insecurity:    Worry: Not on file    Inability: Not on file  . Transportation needs:    Medical: Not on file    Non-medical: Not on file  Tobacco Use  . Smoking status: Current Every Day Smoker    Packs/day: 0.50    Years: 25.00    Pack years: 12.50    Types: Cigarettes  . Smokeless tobacco: Never  Used  Substance and Sexual Activity  . Alcohol use: No  . Drug use: No  . Sexual activity: Yes    Birth control/protection: Surgical  Lifestyle  . Physical activity:    Days per week: Not on file    Minutes per session: Not on file  . Stress: Not on file  Relationships  . Social connections:    Talks on phone: Not on file    Gets together: Not on file    Attends religious service: Not on file    Active member of club or organization: Not on file    Attends meetings of clubs or organizations: Not on file    Relationship status: Not on file  . Intimate partner violence:    Fear of current or ex partner: Not on file    Emotionally abused: Not on file    Physically abused: Not on file    Forced sexual activity: Not on file  Other Topics Concern  . Not on file  Social History Narrative  . Not on file    Review of Systems  All other systems reviewed and are negative.   PHYSICAL EXAMINATION:    Ht 5' 4.75" (1.645 m)   Wt 198 lb 9.6 oz (90.1 kg)   LMP 10/20/2018   BMI 33.30 kg/m     General appearance: alert, cooperative and appears stated age   Pelvic US Uterus normal.  EMS 5.87 mm. Ovaries normal.  No free fluid.  ASSESSMENT  CIN II/III. Positive margin on LEEP.  Smoker.  Dysmenorrhea.   PLAN  Fu pap and ECC.   If abnormal and showing HGSIL, may need cold knife conization.  Hysterectomy may follow this.  If normal pathology, may still choose hysterectomy or do follow up in 6 months with another pap and ECC.  We discussed endometriosis as a potential cause of her painful periods.  For her dysmenorrhea, we discussed Micronor, Depo Provera and hysterectomy.  Rx for Motrin 800 mg q 8 hours prn.   An After Visit Summary was printed and given to the patient.  __25____ minutes face to face time of which over 50% was spent in counseling.

## 2018-11-09 ENCOUNTER — Telehealth: Payer: Self-pay

## 2018-11-09 NOTE — Telephone Encounter (Deleted)
-----   Message from Patton Salles, MD sent at 11/06/2018  6:20 AM EST ----- Please report results of pap and ECC which are all normal and negative for dysplasia.  Her HR HPV testing is negative as well.   We discussed her options for care at the office yesterday.  At a minimum, she needs to be scheduled for her well woman visit with pap and ECC in 6 months with respect to pap follow up.  Pap recall - 08 for July 2020.  She is considering hysterectomy versus progesterone contraception such as Micronor for her dysmenorrhea.  Hysterectomy is also a consideration for her high cervical dysplasia with a positive margin on LEEP.

## 2018-11-09 NOTE — Telephone Encounter (Addendum)
Patient already contacted by Claudette Laws, CMA.

## 2019-04-13 ENCOUNTER — Other Ambulatory Visit: Payer: Self-pay | Admitting: Obstetrics and Gynecology

## 2019-04-13 NOTE — Telephone Encounter (Signed)
Medication refill request: Ibuprofen  Last OV:  11-05-2018 BS Next AEX: 05-06-2019 Last MMG (if hormonal medication request): n/a Refill authorized: 11-05-2018, #30, 2RF. Today, please advise.   Medication pended for #30, 0RF as patient has aex on 05-06-2019. Please refill if appropriate.

## 2019-04-27 DIAGNOSIS — R7302 Impaired glucose tolerance (oral): Secondary | ICD-10-CM | POA: Insufficient documentation

## 2019-05-06 ENCOUNTER — Ambulatory Visit: Payer: 59 | Admitting: Obstetrics and Gynecology

## 2019-05-06 ENCOUNTER — Encounter: Payer: Self-pay | Admitting: Obstetrics and Gynecology

## 2019-05-09 ENCOUNTER — Telehealth: Payer: Self-pay | Admitting: Obstetrics and Gynecology

## 2019-05-09 NOTE — Telephone Encounter (Signed)
Please contact patient to reschedule her annual exam.  She did not keep her appointment last week.  She is in pap recall.

## 2019-05-10 NOTE — Telephone Encounter (Signed)
Left message to call Merced Brougham, RN at [facility] [phone].   

## 2019-05-12 NOTE — Telephone Encounter (Signed)
Left message to call Melita Villalona, RN at [facility] [phone].   

## 2019-05-17 NOTE — Telephone Encounter (Signed)
MyChart message to patient.  

## 2019-05-31 NOTE — Telephone Encounter (Signed)
MyChart message read by Hal Hope at 5:05 PM on 05/17/2019.  Per review of Epic no AEX scheduled to date.   Routing to Dr. Quincy Simmonds to advise.

## 2019-06-01 NOTE — Telephone Encounter (Signed)
Please send a letter to patient recommending an annual exam with a pap, HR HPV testing, and an ECC.  She had a positive margin with high grade dysplasia on her LEEP a year ago. This requires further follow up.

## 2019-06-09 NOTE — Telephone Encounter (Signed)
Letter sent to patient's home address on file. Encounter closed. 

## 2020-08-25 ENCOUNTER — Other Ambulatory Visit: Payer: Self-pay

## 2020-08-25 ENCOUNTER — Emergency Department (HOSPITAL_BASED_OUTPATIENT_CLINIC_OR_DEPARTMENT_OTHER)
Admission: EM | Admit: 2020-08-25 | Discharge: 2020-08-25 | Disposition: A | Discharge status: Home / Self Care | Payer: 59 | Attending: Emergency Medicine | Admitting: Emergency Medicine

## 2020-08-25 ENCOUNTER — Emergency Department (HOSPITAL_BASED_OUTPATIENT_CLINIC_OR_DEPARTMENT_OTHER): Payer: 59

## 2020-08-25 ENCOUNTER — Encounter (HOSPITAL_BASED_OUTPATIENT_CLINIC_OR_DEPARTMENT_OTHER): Payer: Self-pay | Admitting: Emergency Medicine

## 2020-08-25 DIAGNOSIS — J209 Acute bronchitis, unspecified: Secondary | ICD-10-CM | POA: Diagnosis not present

## 2020-08-25 DIAGNOSIS — F17210 Nicotine dependence, cigarettes, uncomplicated: Secondary | ICD-10-CM | POA: Diagnosis not present

## 2020-08-25 DIAGNOSIS — Z20822 Contact with and (suspected) exposure to covid-19: Secondary | ICD-10-CM | POA: Diagnosis not present

## 2020-08-25 DIAGNOSIS — E876 Hypokalemia: Secondary | ICD-10-CM | POA: Diagnosis not present

## 2020-08-25 DIAGNOSIS — J40 Bronchitis, not specified as acute or chronic: Secondary | ICD-10-CM

## 2020-08-25 DIAGNOSIS — R0602 Shortness of breath: Secondary | ICD-10-CM | POA: Diagnosis present

## 2020-08-25 LAB — CBC WITH DIFFERENTIAL/PLATELET
Abs Immature Granulocytes: 0.04 10*3/uL (ref 0.00–0.07)
Basophils Absolute: 0 10*3/uL (ref 0.0–0.1)
Basophils Relative: 0 %
Eosinophils Absolute: 0.2 10*3/uL (ref 0.0–0.5)
Eosinophils Relative: 2 %
HCT: 38.7 % (ref 36.0–46.0)
Hemoglobin: 12.9 g/dL (ref 12.0–15.0)
Immature Granulocytes: 0 %
Lymphocytes Relative: 24 %
Lymphs Abs: 2.8 10*3/uL (ref 0.7–4.0)
MCH: 28.8 pg (ref 26.0–34.0)
MCHC: 33.3 g/dL (ref 30.0–36.0)
MCV: 86.4 fL (ref 80.0–100.0)
Monocytes Absolute: 0.8 10*3/uL (ref 0.1–1.0)
Monocytes Relative: 7 %
Neutro Abs: 7.6 10*3/uL (ref 1.7–7.7)
Neutrophils Relative %: 67 %
Platelets: 284 10*3/uL (ref 150–400)
RBC: 4.48 MIL/uL (ref 3.87–5.11)
RDW: 13 % (ref 11.5–15.5)
WBC: 11.4 10*3/uL — ABNORMAL HIGH (ref 4.0–10.5)
nRBC: 0 % (ref 0.0–0.2)

## 2020-08-25 LAB — TROPONIN I (HIGH SENSITIVITY): Troponin I (High Sensitivity): 3 ng/L (ref <18)

## 2020-08-25 LAB — BASIC METABOLIC PANEL
Anion gap: 12 (ref 5–15)
BUN: 13 mg/dL (ref 6–20)
CO2: 26 mmol/L (ref 22–32)
Calcium: 8.9 mg/dL (ref 8.9–10.3)
Chloride: 99 mmol/L (ref 98–111)
Creatinine, Ser: 0.78 mg/dL (ref 0.44–1.00)
GFR, Estimated: >60 mL/min (ref >60)
Glucose, Bld: 111 mg/dL — ABNORMAL HIGH (ref 70–99)
Potassium: 2.9 mmol/L — ABNORMAL LOW (ref 3.5–5.1)
Sodium: 137 mmol/L (ref 135–145)

## 2020-08-25 LAB — RESP PANEL BY RT PCR (RSV, FLU A&B, COVID)
Influenza A by PCR: NEGATIVE
Influenza B by PCR: NEGATIVE
Respiratory Syncytial Virus by PCR: NEGATIVE
SARS Coronavirus 2 by RT PCR: NEGATIVE

## 2020-08-25 LAB — MAGNESIUM: Magnesium: 1.7 mg/dL (ref 1.7–2.4)

## 2020-08-25 LAB — D-DIMER, QUANTITATIVE: D-Dimer, Quant: 0.36 ug/mL-FEU (ref 0.00–0.50)

## 2020-08-25 MED ORDER — PREDNISONE 20 MG PO TABS: 20.0000 mg | ORAL_TABLET | Freq: Every day | ORAL | 0 refills | Status: DC

## 2020-08-25 MED ORDER — AZITHROMYCIN 250 MG PO TABS: 250.0000 mg | ORAL_TABLET | Freq: Every day | ORAL | 0 refills | Status: DC

## 2020-08-25 MED ORDER — IPRATROPIUM-ALBUTEROL 20-100 MCG/ACT IN AERS
2.0000 | INHALATION_SPRAY | Freq: Four times a day (QID) | RESPIRATORY_TRACT | Status: DC
  Administered 2020-08-25: 2 via RESPIRATORY_TRACT
  Filled 2020-08-25: qty 4

## 2020-08-25 MED ORDER — IPRATROPIUM BROMIDE HFA 17 MCG/ACT IN AERS: 2.0000 | INHALATION_SPRAY | Freq: Once | RESPIRATORY_TRACT | Status: DC

## 2020-08-25 MED ORDER — POTASSIUM CHLORIDE CRYS ER 20 MEQ PO TBCR
40.0000 meq | EXTENDED_RELEASE_TABLET | Freq: Once | ORAL | Status: AC
  Administered 2020-08-25: 40 meq via ORAL
  Filled 2020-08-25: qty 2

## 2020-08-25 MED ORDER — METHYLPREDNISOLONE SODIUM SUCC 125 MG IJ SOLR
125.0000 mg | Freq: Once | INTRAMUSCULAR | Status: AC
  Administered 2020-08-25: 125 mg via INTRAVENOUS
  Filled 2020-08-25: qty 2

## 2020-08-25 MED ORDER — ALBUTEROL SULFATE HFA 108 (90 BASE) MCG/ACT IN AERS: 1.0000 | INHALATION_SPRAY | Freq: Once | RESPIRATORY_TRACT | Status: DC

## 2020-08-25 NOTE — Discharge Instructions (Signed)
Seen here for cough and shortness of breath.  Lab work imaging looks reassuring.  I start you on antibiotics and steroids please take as prescribed.  I also provide you with a inhaler please use every 4 hours as needed.  Your potassium was low today recommend 5 days of over-the-counter potassium.    I recommend follow-up with your PCP for further evaluation management.  Come back to the emergency department if you develop chest pain, shortness of breath, severe abdominal pain, uncontrolled nausea, vomiting, diarrhea.

## 2020-08-25 NOTE — ED Notes (Signed)
Pt. Reports she has had a cough with shortness of breath and was told the Dr. Catalina Pizza her to come here due to she could have a blood clot.  Pt. Has had back pain and back issues for approx. A week and is seeing a PT. For this.

## 2020-08-25 NOTE — ED Triage Notes (Signed)
Reports feeling SOB for a week.  Reports being immobile recently due to sciatica.  Also endorses a dry cough.  Denies any fever.

## 2020-08-25 NOTE — ED Notes (Signed)
Pt. Speaks clear speech and has no break in speech

## 2020-08-25 NOTE — ED Provider Notes (Signed)
[facility] EMERGENCY DEPARTMENT Provider Note   CSN: [id number] Arrival date & time: 08/25/20  1512     History Chief Complaint  Patient presents with  . Shortness of Breath    Barbara Barr is a 44 y.o. female.  HPI    Patient with significant medical history of chronic bronchitis, anxiety, current smoker presents to the emergency department with chief complaint of shortness of breath and a dry cough.  Patient states last week she developed a productive cough and a sore throat.  The cough then turned to a dry cough and she started to have difficulty breathing.  She states she feels like her chest is very tight and becomes more short of breath on exertion.  She denies actual chest pain, nausea, vomiting, paresthesias or weakness to upper or lower extremities.  She endorses that she has been bedridden for the last week as she has sciatica and is having a hard time moving around.  She denies leg swelling, leg pain, is not on hormone therapy, denies DVT or PE history or cardiac history.  She does endorse that she has chronic bronchitis as she is a current smoker.  She states she has not used her inhaler in a long time and is concerned that this may be bronchitis exacerbation or possible pneumonia.  Patient denies any alleviating or aggravating factors at this time.  Patient denies headache, fever, chills, nasal congestion, chest pain, abdominal pain, nausea, vomiting, diarrhea, pedal edema.  Past Medical History:  Diagnosis Date  . ADHD (attention deficit hyperactivity disorder)   . Allergic rhinitis, seasonal   . Anemia   . Anxiety   . Cervical dysplasia    high grade  . Depression   . Environmental allergies   . Headache   . History of gestational diabetes   . Obsessive-compulsive disorder   . PONV (postoperative nausea and vomiting)   . Pre-diabetes   . Wears glasses     Patient Active Problem List   Diagnosis Date Noted  . Snoring 04/07/2018  . Tobacco abuse  04/07/2018  . Pap smear of cervix with ASCUS, cannot exclude HGSIL 04/03/2018  . Class 2 obesity with body mass index (BMI) of 36.0 to 36.9 in adult 11/10/2017  . Allergic rhinitis 11/10/2017    Past Surgical History:  Procedure Laterality Date  . COLPOSCOPY N/A 05/12/2018   Procedure: COLPOSCOPY;  Surgeon: Patton Salles, MD;  Location: [facility];  Service: Gynecology;  Laterality: N/A;  . LAPAROSCOPIC CHOLECYSTECTOMY  2003  . LEEP N/A 05/12/2018   Procedure: LOOP ELECTROSURGICAL EXCISION PROCEDURE (LEEP) with ECC;  Surgeon: Patton Salles, MD;  Location: [facility];  Service: Gynecology;  Laterality: N/A;  with ECC  . RHINOPLASTY  02/2018  . TUBAL LIGATION Bilateral 2006     OB History    Gravida  2   Para  2   Term      Preterm      AB      Living        SAB      TAB      Ectopic      Multiple      Live Births              Family History  Problem Relation Age of Onset  . Dementia Maternal Aunt   . ADD / ADHD Maternal Uncle   . Stroke Maternal Uncle   . Seizures Mother   .  Bipolar disorder Mother   . Cancer Maternal Aunt   . Cervical cancer Maternal Aunt   . Cancer Maternal Aunt   . Heart attack Maternal Uncle     Social History   Tobacco Use  . Smoking status: Current Every Day Smoker    Packs/day: 0.50    Years: 25.00    Pack years: 12.50    Types: Cigarettes  . Smokeless tobacco: Never Used  Vaping Use  . Vaping Use: Never used  Substance Use Topics  . Alcohol use: No  . Drug use: No    Home Medications Prior to Admission medications   Medication Sig Start Date End Date Taking? Authorizing Provider  amphetamine-dextroamphetamine (ADDERALL) 30 MG tablet Take 1 tablet by mouth 2 (two) times daily. 10/02/18 10/02/21  [provider]  azithromycin (ZITHROMAX) 250 MG tablet Take 1 tablet (250 mg total) by mouth daily. Take first 2 tablets together, then 1 every day until  finished. 08/25/20   Carroll SageFaulkner, Finn Altemose J, PA-C  fluticasone (FLONASE) 50 MCG/ACT nasal spray Place 1 spray into both nostrils 2 (two) times daily. 04/10/18   [name], [name], MD  Glycerin-Hypromellose-PEG 400 ([facility] DRY EYE RELIEF OP) Apply to eye as needed.    [provider]  ibuprofen (ADVIL) 800 MG tablet Take 1 tablet (800 mg total) by mouth every 8 (eight) hours as needed. 04/13/19   [name], Brook E, MD  predniSONE (DELTASONE) 20 MG tablet Take 1 tablet (20 mg total) by mouth daily for 5 days. 08/25/20 08/30/20  Carroll SageFaulkner, Doshie Maggi J, PA-C    Allergies    Lexapro [escitalopram], Penicillins, Benadryl [diphenhydramine], and Codeine  Review of Systems   Review of Systems  Constitutional: Negative for chills and fever.  HENT: Positive for sore throat. Negative for congestion, tinnitus, trouble swallowing and voice change.   Eyes: Negative for visual disturbance.  Respiratory: Positive for cough and shortness of breath.   Cardiovascular: Negative for chest pain and palpitations.  Gastrointestinal: Negative for abdominal pain, diarrhea and nausea.  Genitourinary: Negative for dysuria, enuresis, flank pain and frequency.  Musculoskeletal: Negative for back pain.  Skin: Negative for rash.  Neurological: Negative for dizziness and headaches.  Hematological: Does not bruise/bleed easily.    Physical Exam Updated Vital Signs BP 125/77   Pulse 76   Temp 98.4 F (36.9 C) (Oral)   Resp 20   Ht 5\' 4"  (1.626 m)   Wt 74.4 kg   SpO2 96%   BMI 28.15 kg/m   Physical Exam Vitals and nursing note reviewed.  Constitutional:      General: She is not in acute distress.    Appearance: She is not ill-appearing.  HENT:     Head: Normocephalic and atraumatic.     Nose: No congestion.     Mouth/Throat:     Mouth: Mucous membranes are moist.     Pharynx: Oropharynx is clear. No oropharyngeal exudate or posterior oropharyngeal erythema.  Eyes:     General: No scleral  icterus. Cardiovascular:     Rate and Rhythm: Normal rate and regular rhythm.     Pulses: Normal pulses.     Heart sounds: No murmur heard.  No friction rub. No gallop.   Pulmonary:     Effort: No respiratory distress.     Breath sounds: No wheezing, rhonchi or rales.     Comments: Patient did not appear to be respiratory distress, satting at 100% room air, nontachypneic, lung sounds had bilateral wheezing heard in  the upper lobes, no rales, rhonchi, stridor noted on exam. Abdominal:     General: There is no distension.     Palpations: Abdomen is soft.     Tenderness: There is no abdominal tenderness. There is no right CVA tenderness, left CVA tenderness or guarding.  Musculoskeletal:        General: No swelling.     Right lower leg: No edema.     Left lower leg: No edema.  Skin:    General: Skin is warm and dry.     Capillary Refill: Capillary refill takes less than 2 seconds.     Findings: No rash.  Neurological:     Mental Status: She is alert.  Psychiatric:        Mood and Affect: Mood normal.     ED Results / Procedures / Treatments   Labs (all labs ordered are listed, but only abnormal results are displayed) Labs Reviewed  CBC WITH DIFFERENTIAL/PLATELET - Abnormal; Notable for the following components:      Result Value   WBC 11.4 (*)    All other components within normal limits  BASIC METABOLIC PANEL - Abnormal; Notable for the following components:   Potassium 2.9 (*)    Glucose, Bld 111 (*)    All other components within normal limits  RESP PANEL BY RT PCR (RSV, FLU A&B, COVID)  MAGNESIUM  D-DIMER, QUANTITATIVE (NOT AT [facility])  TROPONIN I (HIGH SENSITIVITY)    EKG EKG Interpretation  Date/Time:  Friday August 25 2020 15:21:20 EDT Ventricular Rate:  108 PR Interval:  154 QRS Duration: 54 QT Interval:  316 QTC Calculation: 423 R Axis:   47 Text Interpretation: Sinus tachycardia Septal infarct , age undetermined Abnormal ECG Since last tracing Rate faster  Confirmed by Susy Frizzle [id number]) on 08/25/2020 3:44:02 PM   Radiology DG Chest 2 View  Result Date: 08/25/2020 CLINICAL DATA:  Shortness of breath. EXAM: CHEST - 2 VIEW COMPARISON:  06/10/2015 FINDINGS: The heart size and mediastinal contours are within normal limits. No consolidation. No pleural effusions or pneumothorax. The visualized skeletal structures are unremarkable. IMPRESSION: No active cardiopulmonary disease. Electronically Signed   By: Feliberto Harts MD   On: 08/25/2020 15:43    Procedures Procedures (including critical care time)  Medications Ordered in ED Medications  Ipratropium-Albuterol (COMBIVENT) respimat 2 puff (2 puffs Inhalation Given 08/25/20 1733)  methylPREDNISolone sodium succinate (SOLU-MEDROL) 125 mg/2 mL injection 125 mg (125 mg Intravenous Given 08/25/20 1823)  potassium chloride SA (KLOR-CON) CR tablet 40 mEq (40 mEq Oral Given 08/25/20 1821)    ED Course  I have reviewed the triage vital signs and the nursing notes.  Pertinent labs & imaging results that were available during my care of the patient were reviewed by me and considered in my medical decision making (see chart for details).    MDM Rules/Calculators/A&P                          I have personally reviewed all imaging, labs and have interpreted them.  Patient presents with shortness of breath and a dry cough.  She is alert, did not appear in acute distress, vital signs reassuring.  Will order chest x-ray, EKG, troponins, D-dimer, basic labs and provide her with DuoNeb and Solu-Medrol and reevaluate.   CBC showing leukocytosis of 11.4, no signs of anemia.  BMP showing hypokalemia of 2.9, no metabolic acidosis, hyperglycemia of 111, no AKI, no anion gap noted.  Magnesium is 1.7, D-dimer 0.36, troponin is 3.  Respiratory panel negative for Covid, influenza A/B, RSV.  Chest x-ray does not reveal any acute findings, EKG sinus rhythm at signs of ischemia no ST elevation or depression  noted on will provide patient with 40 mg of oral potassium.  I have low suspicion for ACS as history is atypical, patient has no cardiac history, EKG was sinus rhythm without signs of ischemia, initial troponin is 3.  Will defer second troponin as patient has complained of chest tightness for last 3 days.  Low suspicion for PE as patient denies pleuritic chest pain, patient denies leg pain, no pedal edema noted on exam, patient had a negative D-dimer.  Low suspicion for AAA or aortic dissection as history is atypical, patient has low risk factors.  Low suspicion for systemic infection as patient is nontoxic-appearing, vital signs reassuring, no obvious source infection noted on exam.  I suspect patient suffering from a chronic bronchitis exacerbation.  Will provide her with short burst of steroids,  Azithromycin, duo inhaler and have her follow-up with PCP for further evaluation.  Vital signs have remained stable, no indication for hospital admission.  Patient discussed with attending and they agreed with assessment and plan.  Patient given at home care as well strict return precautions.  Patient verbalized that they understood agreed to said plan.    Final Clinical Impression(s) / ED Diagnoses Final diagnoses:  Bronchitis  Hypokalemia    Rx / DC Orders ED Discharge Orders         Ordered    azithromycin (ZITHROMAX) 250 MG tablet  Daily        08/25/20 1858    predniSONE (DELTASONE) 20 MG tablet  Daily        08/25/20 1858           Barnie Del 08/25/20 Tommie Sams, MD 08/25/20 1950

## 2020-08-26 ENCOUNTER — Telehealth (HOSPITAL_BASED_OUTPATIENT_CLINIC_OR_DEPARTMENT_OTHER): Payer: Self-pay | Admitting: Emergency Medicine

## 2020-08-27 ENCOUNTER — Other Ambulatory Visit: Payer: Self-pay

## 2020-08-27 ENCOUNTER — Emergency Department (HOSPITAL_BASED_OUTPATIENT_CLINIC_OR_DEPARTMENT_OTHER)
Admission: EM | Admit: 2020-08-27 | Discharge: 2020-08-27 | Disposition: A | Discharge status: Home / Self Care | Payer: 59 | Attending: Emergency Medicine | Admitting: Emergency Medicine

## 2020-08-27 ENCOUNTER — Emergency Department (HOSPITAL_BASED_OUTPATIENT_CLINIC_OR_DEPARTMENT_OTHER): Payer: 59

## 2020-08-27 ENCOUNTER — Encounter (HOSPITAL_BASED_OUTPATIENT_CLINIC_OR_DEPARTMENT_OTHER): Payer: Self-pay | Admitting: Emergency Medicine

## 2020-08-27 DIAGNOSIS — F17210 Nicotine dependence, cigarettes, uncomplicated: Secondary | ICD-10-CM | POA: Insufficient documentation

## 2020-08-27 DIAGNOSIS — R0789 Other chest pain: Secondary | ICD-10-CM | POA: Diagnosis not present

## 2020-08-27 DIAGNOSIS — R062 Wheezing: Secondary | ICD-10-CM

## 2020-08-27 DIAGNOSIS — R0602 Shortness of breath: Secondary | ICD-10-CM | POA: Diagnosis present

## 2020-08-27 LAB — BASIC METABOLIC PANEL
Anion gap: 13 (ref 5–15)
BUN: 17 mg/dL (ref 6–20)
CO2: 20 mmol/L — ABNORMAL LOW (ref 22–32)
Calcium: 9.3 mg/dL (ref 8.9–10.3)
Chloride: 101 mmol/L (ref 98–111)
Creatinine, Ser: 0.79 mg/dL (ref 0.44–1.00)
GFR, Estimated: >60 mL/min (ref >60)
Glucose, Bld: 160 mg/dL — ABNORMAL HIGH (ref 70–99)
Potassium: 4 mmol/L (ref 3.5–5.1)
Sodium: 134 mmol/L — ABNORMAL LOW (ref 135–145)

## 2020-08-27 LAB — CBC
HCT: 39.1 % (ref 36.0–46.0)
Hemoglobin: 12.9 g/dL (ref 12.0–15.0)
MCH: 28.7 pg (ref 26.0–34.0)
MCHC: 33 g/dL (ref 30.0–36.0)
MCV: 87.1 fL (ref 80.0–100.0)
Platelets: 292 10*3/uL (ref 150–400)
RBC: 4.49 MIL/uL (ref 3.87–5.11)
RDW: 13.2 % (ref 11.5–15.5)
WBC: 12.8 10*3/uL — ABNORMAL HIGH (ref 4.0–10.5)
nRBC: 0 % (ref 0.0–0.2)

## 2020-08-27 MED ORDER — IPRATROPIUM-ALBUTEROL 0.5-2.5 (3) MG/3ML IN SOLN
RESPIRATORY_TRACT | Status: AC
  Administered 2020-08-27: 3 mL
  Filled 2020-08-27: qty 3

## 2020-08-27 MED ORDER — AZITHROMYCIN 250 MG PO TABS: 250.0000 mg | ORAL_TABLET | Freq: Every day | ORAL | 0 refills | Status: DC

## 2020-08-27 MED ORDER — ALBUTEROL SULFATE (2.5 MG/3ML) 0.083% IN NEBU
INHALATION_SOLUTION | RESPIRATORY_TRACT | Status: AC
  Administered 2020-08-27: 2.5 mg
  Filled 2020-08-27: qty 3

## 2020-08-27 MED ORDER — IPRATROPIUM-ALBUTEROL 0.5-2.5 (3) MG/3ML IN SOLN
3.0000 mL | Freq: Once | RESPIRATORY_TRACT | Status: DC
  Filled 2020-08-27: qty 3

## 2020-08-27 MED ORDER — PREDNISONE 20 MG PO TABS: 20.0000 mg | ORAL_TABLET | Freq: Every day | ORAL | 0 refills | Status: AC

## 2020-08-27 MED ORDER — METHYLPREDNISOLONE SODIUM SUCC 125 MG IJ SOLR
125.0000 mg | Freq: Once | INTRAMUSCULAR | Status: AC
  Administered 2020-08-27: 125 mg via INTRAVENOUS
  Filled 2020-08-27: qty 2

## 2020-08-27 NOTE — ED Provider Notes (Signed)
[facility] EMERGENCY DEPARTMENT Provider Note   CSN: [id number] Arrival date & time: 08/27/20  3559     History Chief Complaint  Patient presents with  . Shortness of Breath    Barbara Barr is a 44 y.o. female.  Patient with history of anxiety presents the emergency department today for evaluation of shortness of breath and chest tightness.  Patient states that she has been having trouble with her breathing over the past 4 days.  She was seen in the emergency department 2 days ago and had a evaluation including chest x-ray, EKG, lab evaluation with negative D-dimer, negative troponin, negative Covid.   She was given breathing treatments with improvement in symptoms.  She was found to have a low potassium.  Patient states that the entire left side of her body "felt tight" because of the potassium.  Patient was prescribed azithromycin for treatment of bronchitis, prednisone.  She states that she was unable to pick up these medications due to them being sent to the wrong pharmacy and she has not been taking them. She does report wheezing.  She denies fever, nasal congestion, sore throat.  No nausea, vomiting, or diarrhea.          Past Medical History:  Diagnosis Date  . ADHD (attention deficit hyperactivity disorder)   . Allergic rhinitis, seasonal   . Anemia   . Anxiety   . Cervical dysplasia    high grade  . Depression   . Environmental allergies   . Headache   . History of gestational diabetes   . Obsessive-compulsive disorder   . PONV (postoperative nausea and vomiting)   . Pre-diabetes   . Wears glasses     Patient Active Problem List   Diagnosis Date Noted  . Snoring 04/07/2018  . Tobacco abuse 04/07/2018  . Pap smear of cervix with ASCUS, cannot exclude HGSIL 04/03/2018  . Class 2 obesity with body mass index (BMI) of 36.0 to 36.9 in adult 11/10/2017  . Allergic rhinitis 11/10/2017    Past Surgical History:  Procedure Laterality Date  .  COLPOSCOPY N/A 05/12/2018   Procedure: COLPOSCOPY;  Surgeon: Patton Salles, MD;  Location: [facility];  Service: Gynecology;  Laterality: N/A;  . LAPAROSCOPIC CHOLECYSTECTOMY  2003  . LEEP N/A 05/12/2018   Procedure: LOOP ELECTROSURGICAL EXCISION PROCEDURE (LEEP) with ECC;  Surgeon: Patton Salles, MD;  Location: [facility];  Service: Gynecology;  Laterality: N/A;  with ECC  . RHINOPLASTY  02/2018  . TUBAL LIGATION Bilateral 2006     OB History    Gravida  2   Para  2   Term      Preterm      AB      Living        SAB      TAB      Ectopic      Multiple      Live Births              Family History  Problem Relation Age of Onset  . Dementia Maternal Aunt   . ADD / ADHD Maternal Uncle   . Stroke Maternal Uncle   . Seizures Mother   . Bipolar disorder Mother   . Cancer Maternal Aunt   . Cervical cancer Maternal Aunt   . Cancer Maternal Aunt   . Heart attack Maternal Uncle     Social History   Tobacco Use  .  Smoking status: Current Every Day Smoker    Packs/day: 0.50    Years: 25.00    Pack years: 12.50    Types: Cigarettes  . Smokeless tobacco: Never Used  Vaping Use  . Vaping Use: Never used  Substance Use Topics  . Alcohol use: No  . Drug use: No    Home Medications Prior to Admission medications   Medication Sig Start Date End Date Taking? Authorizing Provider  amphetamine-dextroamphetamine (ADDERALL) 30 MG tablet Take 1 tablet by mouth 2 (two) times daily. 10/02/18 10/02/21  [provider]  azithromycin (ZITHROMAX) 250 MG tablet Take 1 tablet (250 mg total) by mouth daily. Take first 2 tablets together, then 1 every day until finished. 08/25/20   Carroll SageFaulkner, William J, PA-C  fluticasone (FLONASE) 50 MCG/ACT nasal spray Place 1 spray into both nostrils 2 (two) times daily. 04/10/18   [name], [name], MD  Glycerin-Hypromellose-PEG 400 ([facility] DRY EYE RELIEF OP) Apply to eye as needed.     [provider]  ibuprofen (ADVIL) 800 MG tablet Take 1 tablet (800 mg total) by mouth every 8 (eight) hours as needed. 04/13/19   [name], Brook E, MD  predniSONE (DELTASONE) 20 MG tablet Take 1 tablet (20 mg total) by mouth daily for 5 days. 08/25/20 08/30/20  Carroll SageFaulkner, William J, PA-C    Allergies    Lexapro [escitalopram], Penicillins, Benadryl [diphenhydramine], and Codeine  Review of Systems   Review of Systems  Constitutional: Negative for chills and fever.  HENT: Negative for rhinorrhea and sore throat.   Eyes: Negative for redness.  Respiratory: Positive for cough, chest tightness, shortness of breath and wheezing.   Cardiovascular: Negative for chest pain and leg swelling.  Gastrointestinal: Negative for abdominal pain, diarrhea, nausea and vomiting.  Genitourinary: Negative for dysuria, frequency, hematuria and urgency.  Musculoskeletal: Negative for myalgias.  Skin: Negative for rash.  Neurological: Negative for headaches.    Physical Exam Updated Vital Signs BP (!) 116/98 (BP Location: Right Arm)   Pulse (!) 108   Resp 18   SpO2 100%   Physical Exam Vitals and nursing note reviewed.  Constitutional:      General: She is not in acute distress.    Appearance: She is well-developed.  HENT:     Head: Normocephalic and atraumatic.     Right Ear: External ear normal.     Left Ear: External ear normal.     Nose: Nose normal.  Eyes:     Conjunctiva/sclera: Conjunctivae normal.  Cardiovascular:     Rate and Rhythm: Normal rate and regular rhythm.     Heart sounds: No murmur heard.   Pulmonary:     Effort: No respiratory distress.     Breath sounds: Wheezing present. No decreased breath sounds, rhonchi or rales.  Abdominal:     Palpations: Abdomen is soft.     Tenderness: There is no abdominal tenderness. There is no guarding or rebound.  Musculoskeletal:     Cervical back: Normal range of motion and neck supple.     Right lower leg: No edema.       Left lower leg: No edema.  Skin:    General: Skin is warm and dry.     Findings: No rash.  Neurological:     General: No focal deficit present.     Mental Status: She is alert. Mental status is at baseline.     Motor: No weakness.  Psychiatric:        Mood  and Affect: Mood is anxious.     ED Results / Procedures / Treatments   Labs (all labs ordered are listed, but only abnormal results are displayed) Labs Reviewed  CBC - Abnormal; Notable for the following components:      Result Value   WBC 12.8 (*)    All other components within normal limits  BASIC METABOLIC PANEL - Abnormal; Notable for the following components:   Sodium 134 (*)    CO2 20 (*)    Glucose, Bld 160 (*)    All other components within normal limits    ED ECG REPORT   Date: 08/27/2020  Rate: 87  Rhythm: sinus arrhythmia  QRS Axis: normal  Intervals: normal  ST/T Wave abnormalities: normal  Conduction Disutrbances:none  Narrative Interpretation:   Old EKG Reviewed: unchanged except slower today  I have personally reviewed the EKG tracing and agree with the computerized printout as noted.   Radiology DG Chest 2 View  Result Date: 08/25/2020 CLINICAL DATA:  Shortness of breath. EXAM: CHEST - 2 VIEW COMPARISON:  06/10/2015 FINDINGS: The heart size and mediastinal contours are within normal limits. No consolidation. No pleural effusions or pneumothorax. The visualized skeletal structures are unremarkable. IMPRESSION: No active cardiopulmonary disease. Electronically Signed   By: Feliberto Harts MD   On: 08/25/2020 15:43    Procedures Procedures (including critical care time)  Medications Ordered in ED Medications  methylPREDNISolone sodium succinate (SOLU-MEDROL) 125 mg/2 mL injection 125 mg (has no administration in time range)  albuterol (PROVENTIL) (2.5 MG/3ML) 0.083% nebulizer solution (2.5 mg  Given 08/27/20 1003)  ipratropium-albuterol (DUONEB) 0.5-2.5 (3) MG/3ML nebulizer solution (3  mLs  Given 08/27/20 1003)    ED Course  I have reviewed the triage vital signs and the nursing notes.  Pertinent labs & imaging results that were available during my care of the patient were reviewed by me and considered in my medical decision making (see chart for details).  Patient seen and examined. Work-up initiated. Medications ordered. EKG reviewed. RT at bedside. Sats are normal, pt tachycardic, hyperventilating.   Vital signs reviewed and are as follows: BP (!) 116/98 (BP Location: Right Arm)   Pulse (!) 108   Resp 18   SpO2 100%   10:51 AM patient was given a DuoNeb with improvement in her symptoms.  I have also ordered a dose of Solu-Medrol.  Will reassess.  11:29 AM patient continues to be very well.  She states that she is feeling better.  On reexam of her lungs, no wheezing is heard.  Plan for discharge.  I asked that [facility] [place] [facility].  She has inhalers at home to use.  Encouraged to use these every 4 hours over the next day or so with the steroids begin to work.  Encourage PCP follow-up in regards to your potassium levels to have these rechecked.  I advised no routine supplementation at this time, unless told otherwise by her physician.  Patient urged to return with worsening symptoms or other concerns. Patient verbalized understanding and agrees with plan.      MDM Rules/Calculators/A&P                          Patient presents with chest tightness, shortness of breath, wheezing.  Exam is consistent with acute episode of bronchospasm.  Patient was seen 2 days ago with extensive work-up that was reassuring.  Potassium is normal today.  Mild  white blood cell count, likely due to stress response.  Patient much improved with breathing treatments here and IV steroids.  She is comfortable discharged home.  We will continue plan initiated at prior ED visit.  Final Clinical Impression(s) / ED Diagnoses Final diagnoses:  Shortness of  breath  Wheezing    Rx / DC Orders ED Discharge Orders         Ordered    azithromycin (ZITHROMAX) 250 MG tablet  Daily        08/27/20 1127    predniSONE (DELTASONE) 20 MG tablet  Daily        08/27/20 1127           Renne Crigler, PA-C 08/27/20 1131    Alvira Monday, MD 08/28/20 [phone]

## 2020-08-27 NOTE — ED Notes (Signed)
ED Provider at bedside. 

## 2020-08-27 NOTE — Discharge Instructions (Signed)
Please read and follow all provided instructions.  Your diagnoses today include:  1. Shortness of breath   2. Wheezing     Tests performed today include:  Chest x-ray - does not show any pneumonia Blood cell counts (white, red, and platelets) Electrolytes - potassium was normal at 4.0 today Kidney function test EKG   Vital signs. See below for your results today.   Medications prescribed:   Prednisone - steroid medicine   It is best to take this medication in the morning to prevent sleeping problems. If you are diabetic, monitor your blood sugar closely and stop taking Prednisone if blood sugar is over 300. Take with food to prevent stomach upset.    Azithromycin - antibiotic for respiratory infection  You have been prescribed an antibiotic medicine: take the entire course of medicine even if you are feeling better. Stopping early can cause the antibiotic not to work.  Take any prescribed medications only as directed.  Home care instructions:  Follow any educational materials contained in this packet.  Follow-up instructions: Please follow-up with your primary care provider in the next 3 days for further evaluation of your symptoms and a recheck if you are not feeling better.   Return instructions:   Please return to the Emergency Department if you experience worsening symptoms.  Please return with worsening wheezing, shortness of breath, or difficulty breathing.  Return with persistent fever above 101F.   Please return if you have any other emergent concerns.  Additional Information:  Your vital signs today were: BP (!) 116/98 (BP Location: Right Arm)   Pulse (!) 112   Temp 98.6 F (37 C) (Oral)   Resp 17   SpO2 100%  If your blood pressure (BP) was elevated above 135/85 this visit, please have this repeated by your doctor within one month. --------------

## 2020-08-27 NOTE — ED Notes (Signed)
RT at bedside.

## 2020-08-27 NOTE — ED Notes (Addendum)
States," I feel better" Speaking in complete sentences , resp even and unlabored

## 2020-08-27 NOTE — ED Triage Notes (Signed)
Pt presents noticeably SOB. Seen here recently for same. Unable to answer questions.

## 2020-08-28 DIAGNOSIS — J45901 Unspecified asthma with (acute) exacerbation: Secondary | ICD-10-CM | POA: Insufficient documentation

## 2020-08-28 DIAGNOSIS — J45909 Unspecified asthma, uncomplicated: Secondary | ICD-10-CM | POA: Insufficient documentation

## 2020-09-11 DIAGNOSIS — K589 Irritable bowel syndrome without diarrhea: Secondary | ICD-10-CM | POA: Insufficient documentation

## 2020-09-11 DIAGNOSIS — K5900 Constipation, unspecified: Secondary | ICD-10-CM | POA: Insufficient documentation

## 2021-01-29 DIAGNOSIS — F988 Other specified behavioral and emotional disorders with onset usually occurring in childhood and adolescence: Secondary | ICD-10-CM | POA: Diagnosis not present

## 2021-01-29 DIAGNOSIS — R7302 Impaired glucose tolerance (oral): Secondary | ICD-10-CM | POA: Diagnosis not present

## 2021-01-29 DIAGNOSIS — Z0000 Encounter for general adult medical examination without abnormal findings: Secondary | ICD-10-CM | POA: Diagnosis not present

## 2021-05-03 DIAGNOSIS — F988 Other specified behavioral and emotional disorders with onset usually occurring in childhood and adolescence: Secondary | ICD-10-CM | POA: Diagnosis not present

## 2021-05-03 DIAGNOSIS — R7302 Impaired glucose tolerance (oral): Secondary | ICD-10-CM | POA: Diagnosis not present

## 2021-05-03 DIAGNOSIS — E876 Hypokalemia: Secondary | ICD-10-CM | POA: Diagnosis not present

## 2021-07-17 ENCOUNTER — Encounter: Payer: Self-pay | Admitting: Podiatry

## 2021-07-17 ENCOUNTER — Other Ambulatory Visit: Payer: Self-pay

## 2021-07-17 ENCOUNTER — Ambulatory Visit (INDEPENDENT_AMBULATORY_CARE_PROVIDER_SITE_OTHER): Payer: Managed Care, Other (non HMO)

## 2021-07-17 ENCOUNTER — Ambulatory Visit: Payer: Managed Care, Other (non HMO) | Admitting: Podiatry

## 2021-07-17 DIAGNOSIS — M79671 Pain in right foot: Secondary | ICD-10-CM

## 2021-07-17 DIAGNOSIS — M722 Plantar fascial fibromatosis: Secondary | ICD-10-CM

## 2021-07-17 DIAGNOSIS — M7751 Other enthesopathy of right foot: Secondary | ICD-10-CM

## 2021-07-17 NOTE — Patient Instructions (Addendum)
If was nice to meet you today. If you have any questions or any further concerns, please feel fee to give me a call. You can call our office at [phone] or please feel fee to send me a message through MyChart.   --  Look at "powerstep" or "superfeet" inserts  --  While at your visit today you received a steroid injection in your foot or ankle to help with your pain. Along with having the steroid medication there is some "numbing" medication in the shot that you received. Due to this you may notice some numbness to the area for the next couple of hours.    The actually benefit from the steroid injection may take up to 2-7 days to see a difference. You may actually experience a small (as in 10%) INCREASE in pain in the first 24 hours---that is common. It would be best if you can ice the area today and take anti-inflammatory medications (such as Ibuprofen, Motrin, or Aleve) if you are able to take these medications. If you were prescribed another medication to help with the pain go ahead and start that medication today    Things to watch out for that you should contact us or a health care provider urgently would include: 1. Unusual (as in more than 10%) increase in pain 2. New fever > 101.5 3. New swelling or redness of the injected area.  4. Streaking of red lines around the area injected.  If you have any questions or concerns about this, please give our office a call at [phone].    Plantar Fasciitis (Heel Spur Syndrome) with Rehab The plantar fascia is a fibrous, ligament-like, soft-tissue structure that spans the bottom of the foot. Plantar fasciitis is a condition that causes pain in the foot due to inflammation of the tissue. SYMPTOMS  Pain and tenderness on the underneath side of the foot. Pain that worsens with standing or walking. CAUSES  Plantar fasciitis is caused by irritation and injury to the plantar fascia on the underneath side of the foot. Common mechanisms of  injury include: Direct trauma to bottom of the foot. Damage to a small nerve that runs under the foot where the main fascia attaches to the heel bone. Stress placed on the plantar fascia due to bone spurs. RISK INCREASES WITH:  Activities that place stress on the plantar fascia (running, jumping, pivoting, or cutting). Poor strength and flexibility. Improperly fitted shoes. Tight calf muscles. Flat feet. Failure to warm-up properly before activity. Obesity. PREVENTION Warm up and stretch properly before activity. Allow for adequate recovery between workouts. Maintain physical fitness: Strength, flexibility, and endurance. Cardiovascular fitness. Maintain a health body weight. Avoid stress on the plantar fascia. Wear properly fitted shoes, including arch supports for individuals who have flat feet.  PROGNOSIS  If treated properly, then the symptoms of plantar fasciitis usually resolve without surgery. However, occasionally surgery is necessary.  RELATED COMPLICATIONS  Recurrent symptoms that may result in a chronic condition. Problems of the lower back that are caused by compensating for the injury, such as limping. Pain or weakness of the foot during push-off following surgery. Chronic inflammation, scarring, and partial or complete fascia tear, occurring more often from repeated injections.  TREATMENT  Treatment initially involves the use of ice and medication to help reduce pain and inflammation. The use of strengthening and stretching exercises may help reduce pain with activity, especially stretches of the Achilles tendon. These exercises may be performed at home or with a therapist. Your  caregiver may recommend that you use heel cups of arch supports to help reduce stress on the plantar fascia. Occasionally, corticosteroid injections are given to reduce inflammation. If symptoms persist for greater than 6 months despite non-surgical (conservative), then surgery may be  recommended.   MEDICATION  If pain medication is necessary, then nonsteroidal anti-inflammatory medications, such as aspirin and ibuprofen, or other minor pain relievers, such as acetaminophen, are often recommended. Do not take pain medication within 7 days before surgery. Prescription pain relievers may be given if deemed necessary by your caregiver. Use only as directed and only as much as you need. Corticosteroid injections may be given by your caregiver. These injections should be reserved for the most serious cases, because they may only be given a certain number of times.  HEAT AND COLD Cold treatment (icing) relieves pain and reduces inflammation. Cold treatment should be applied for 10 to 15 minutes every 2 to 3 hours for inflammation and pain and immediately after any activity that aggravates your symptoms. Use ice packs or massage the area with a piece of ice (ice massage). Heat treatment may be used prior to performing the stretching and strengthening activities prescribed by your caregiver, physical therapist, or athletic trainer. Use a heat pack or soak the injury in warm water.  SEEK IMMEDIATE MEDICAL CARE IF: Treatment seems to offer no benefit, or the condition worsens. Any medications produce adverse side effects.  EXERCISES- RANGE OF MOTION (ROM) AND STRETCHING EXERCISES - Plantar Fasciitis (Heel Spur Syndrome) These exercises may help you when beginning to rehabilitate your injury. Your symptoms may resolve with or without further involvement from your physician, physical therapist or athletic trainer. While completing these exercises, remember:  Restoring tissue flexibility helps normal motion to return to the joints. This allows healthier, less painful movement and activity. An effective stretch should be held for at least 30 seconds. A stretch should never be painful. You should only feel a gentle lengthening or release in the stretched tissue.  RANGE OF MOTION - Toe  Extension, Flexion Sit with your right / left leg crossed over your opposite knee. Grasp your toes and gently pull them back toward the top of your foot. You should feel a stretch on the bottom of your toes and/or foot. Hold this stretch for 10 seconds. Now, gently pull your toes toward the bottom of your foot. You should feel a stretch on the top of your toes and or foot. Hold this stretch for 10 seconds. Repeat  times. Complete this stretch 3 times per day.   RANGE OF MOTION - Ankle Dorsiflexion, Active Assisted Remove shoes and sit on a chair that is preferably not on a carpeted surface. Place right / left foot under knee. Extend your opposite leg for support. Keeping your heel down, slide your right / left foot back toward the chair until you feel a stretch at your ankle or calf. If you do not feel a stretch, slide your bottom forward to the edge of the chair, while still keeping your heel down. Hold this stretch for 10 seconds. Repeat 3 times. Complete this stretch 2 times per day.   STRETCH  Gastroc, Standing Place hands on wall. Extend right / left leg, keeping the front knee somewhat bent. Slightly point your toes inward on your back foot. Keeping your right / left heel on the floor and your knee straight, shift your weight toward the wall, not allowing your back to arch. You should feel a gentle stretch in  the right / left calf. Hold this position for 10 seconds. Repeat 3 times. Complete this stretch 2 times per day.  STRETCH  Soleus, Standing Place hands on wall. Extend right / left leg, keeping the other knee somewhat bent. Slightly point your toes inward on your back foot. Keep your right / left heel on the floor, bend your back knee, and slightly shift your weight over the back leg so that you feel a gentle stretch deep in your back calf. Hold this position for 10 seconds. Repeat 3 times. Complete this stretch 2 times per day.  STRETCH  Gastrocsoleus, Standing  Note: This  exercise can place a lot of stress on your foot and ankle. Please complete this exercise only if specifically instructed by your caregiver.  Place the ball of your right / left foot on a step, keeping your other foot firmly on the same step. Hold on to the wall or a rail for balance. Slowly lift your other foot, allowing your body weight to press your heel down over the edge of the step. You should feel a stretch in your right / left calf. Hold this position for 10 seconds. Repeat this exercise with a slight bend in your right / left knee. Repeat 3 times. Complete this stretch 2 times per day.   STRENGTHENING EXERCISES - Plantar Fasciitis (Heel Spur Syndrome)  These exercises may help you when beginning to rehabilitate your injury. They may resolve your symptoms with or without further involvement from your physician, physical therapist or athletic trainer. While completing these exercises, remember:  Muscles can gain both the endurance and the strength needed for everyday activities through controlled exercises. Complete these exercises as instructed by your physician, physical therapist or athletic trainer. Progress the resistance and repetitions only as guided.  STRENGTH - Towel Curls Sit in a chair positioned on a non-carpeted surface. Place your foot on a towel, keeping your heel on the floor. Pull the towel toward your heel by only curling your toes. Keep your heel on the floor. Repeat 3 times. Complete this exercise 2 times per day.  STRENGTH - Ankle Inversion Secure one end of a rubber exercise band/tubing to a fixed object (table, pole). Loop the other end around your foot just before your toes. Place your fists between your knees. This will focus your strengthening at your ankle. Slowly, pull your big toe up and in, making sure the band/tubing is positioned to resist the entire motion. Hold this position for 10 seconds. Have your muscles resist the band/tubing as it slowly pulls  your foot back to the starting position. Repeat 3 times. Complete this exercises 2 times per day.  Document Released: 10/14/2005 Document Revised: 01/06/2012 Document Reviewed: 01/26/2009 [facility] Patient Information 2014 [place], [place].

## 2021-07-24 NOTE — Progress Notes (Signed)
Subjective:   Patient ID: Barbara Barr, female   DOB: 45 y.o.   MRN: [id number]   HPI 45 year old female presents the office today for concerns of pain to her right heel.  She states that the pain has moved to the ankle some.  No recent injury.  She is tried over-the-counter inserts.  She did see her primary care physician should x-rays performed as well.  She states that she gets a sharp, stabbing pain to the heel but is also moved to the ankle, top of the foot.  Hurts in the morning or if she sits down.   Review of Systems  All other systems reviewed and are negative.  Past Medical History:  Diagnosis Date   ADHD (attention deficit hyperactivity disorder)    Allergic rhinitis, seasonal    Anemia    Anxiety    Cervical dysplasia    high grade   Depression    Environmental allergies    Headache    History of gestational diabetes    Obsessive-compulsive disorder    PONV (postoperative nausea and vomiting)    Pre-diabetes    Wears glasses     Past Surgical History:  Procedure Laterality Date   COLPOSCOPY N/A 05/12/2018   Procedure: COLPOSCOPY;  Surgeon: Patton Salles, MD;  Location: [facility];  Service: Gynecology;  Laterality: N/A;   LAPAROSCOPIC CHOLECYSTECTOMY  2003   LEEP N/A 05/12/2018   Procedure: LOOP ELECTROSURGICAL EXCISION PROCEDURE (LEEP) with ECC;  Surgeon: Patton Salles, MD;  Location: [facility];  Service: Gynecology;  Laterality: N/A;  with ECC   RHINOPLASTY  02/2018   TUBAL LIGATION Bilateral 2006     Current Outpatient Medications:    ibuprofen (ADVIL) 800 MG tablet, Take by mouth., Disp: , Rfl:    SUMAtriptan (IMITREX) 100 MG tablet, Take by mouth., Disp: , Rfl:    amphetamine-dextroamphetamine (ADDERALL) 30 MG tablet, Take 1 tablet by mouth 2 (two) times daily., Disp: , Rfl:    azithromycin (ZITHROMAX) 250 MG tablet, Take 1 tablet (250 mg total) by mouth daily. Take first 2 tablets together, then  1 every day until finished., Disp: 6 tablet, Rfl: 0   fluticasone (FLONASE) 50 MCG/ACT nasal spray, Place 1 spray into both nostrils 2 (two) times daily., Disp: 16 g, Rfl: 3   ibuprofen (ADVIL) 800 MG tablet, Take 1 tablet (800 mg total) by mouth every 8 (eight) hours as needed., Disp: 30 tablet, Rfl: 0   metFORMIN (GLUCOPHAGE-XR) 500 MG 24 hr tablet, , Disp: , Rfl:   Allergies  Allergen Reactions   Lexapro [Escitalopram] Hives   Penicillins Hives   Benadryl [Diphenhydramine] Rash   Codeine Itching          Objective:  Physical Exam  General: AAO x3, NAD  Dermatological: Skin is warm, dry and supple bilateral.  There are no open sores, no preulcerative lesions, no rash or signs of infection present.  Vascular: Dorsalis Pedis artery and Posterior Tibial artery pedal pulses are 2/4 bilateral with immedate capillary fill time.  There is no pain with calf compression, swelling, warmth, erythema.   Neruologic: Grossly intact via light touch bilateral.  Negative Tinel sign.  Musculoskeletal: There is tenderness palpation on plantar medial tubercle of the calcaneus at the insertion of the plantar fascia on the right foot.  There is no pain with lateral compression of calcaneus.  There is mild discomfort in the course the posterior tibial, flexor tendon.  There is no area of pinpoint tenderness.  Minimal edema to the medial ankle and the flexor tendons.  Clinically the tendons appear to be intact.  There is no pain with Achilles tendon.  Muscular strength 5/5 in all groups tested bilateral.  Gait: Unassisted, Nonantalgic.       Assessment:   Right heel pain, plantar fasciitis, tendinitis     Plan:  -Treatment options discussed including all alternatives, risks, and complications -Etiology of symptoms were discussed -I reviewed the x-ray report and she should be images of the x-rays herself.  No evidence of acute fracture. -Steroid injection performed.  See procedure note  below. -Plantar fascial brace -Discussed stretching, icing daily.  Discussed shoe modifications and good arch support.  Procedure: Injection Tendon/Ligament Discussed alternatives, risks, complications and verbal consent was obtained.  Location: Right plantar fascia at the glabrous junction; medial approach. Skin Prep: Alcohol. Injectate: 0.5cc 0.5% marcaine plain, 0.5 cc 2% lidocaine plain and, 1 cc kenalog 10. Disposition: Patient tolerated procedure well. Injection site dressed with a band-aid.  Post-injection care was discussed and return precautions discussed.   Return in about 6 weeks (around 08/28/2021), or if symptoms worsen or fail to improve.  Barbara Barr DPM

## 2021-08-28 ENCOUNTER — Ambulatory Visit: Payer: Self-pay | Admitting: Podiatry

## 2021-09-24 ENCOUNTER — Encounter (HOSPITAL_BASED_OUTPATIENT_CLINIC_OR_DEPARTMENT_OTHER): Payer: Self-pay | Admitting: Urology

## 2021-09-24 ENCOUNTER — Other Ambulatory Visit: Payer: Self-pay

## 2021-09-24 ENCOUNTER — Emergency Department (HOSPITAL_BASED_OUTPATIENT_CLINIC_OR_DEPARTMENT_OTHER): Payer: No Typology Code available for payment source

## 2021-09-24 ENCOUNTER — Emergency Department (HOSPITAL_BASED_OUTPATIENT_CLINIC_OR_DEPARTMENT_OTHER)
Admission: EM | Admit: 2021-09-24 | Discharge: 2021-09-24 | Disposition: A | Discharge status: Home / Self Care | Payer: No Typology Code available for payment source | Attending: Emergency Medicine | Admitting: Emergency Medicine

## 2021-09-24 DIAGNOSIS — M542 Cervicalgia: Secondary | ICD-10-CM | POA: Insufficient documentation

## 2021-09-24 DIAGNOSIS — Y92410 Unspecified street and highway as the place of occurrence of the external cause: Secondary | ICD-10-CM | POA: Insufficient documentation

## 2021-09-24 DIAGNOSIS — M25512 Pain in left shoulder: Secondary | ICD-10-CM | POA: Insufficient documentation

## 2021-09-24 DIAGNOSIS — M545 Low back pain, unspecified: Secondary | ICD-10-CM | POA: Insufficient documentation

## 2021-09-24 DIAGNOSIS — F17210 Nicotine dependence, cigarettes, uncomplicated: Secondary | ICD-10-CM | POA: Insufficient documentation

## 2021-09-24 DIAGNOSIS — Z7984 Long term (current) use of oral hypoglycemic drugs: Secondary | ICD-10-CM | POA: Insufficient documentation

## 2021-09-24 DIAGNOSIS — J4541 Moderate persistent asthma with (acute) exacerbation: Secondary | ICD-10-CM | POA: Diagnosis not present

## 2021-09-24 DIAGNOSIS — M7918 Myalgia, other site: Secondary | ICD-10-CM

## 2021-09-24 DIAGNOSIS — R7303 Prediabetes: Secondary | ICD-10-CM | POA: Insufficient documentation

## 2021-09-24 LAB — PREGNANCY, URINE: Preg Test, Ur: NEGATIVE

## 2021-09-24 MED ORDER — KETOROLAC TROMETHAMINE 15 MG/ML IJ SOLN
INTRAMUSCULAR | Status: AC
  Filled 2021-09-24: qty 1

## 2021-09-24 MED ORDER — KETOROLAC TROMETHAMINE 15 MG/ML IJ SOLN: 30.0000 mg | Freq: Once | INTRAMUSCULAR | Status: DC

## 2021-09-24 MED ORDER — CYCLOBENZAPRINE HCL 10 MG PO TABS: 10.0000 mg | ORAL_TABLET | Freq: Two times a day (BID) | ORAL | 0 refills | Status: DC | PRN
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
## 2021-10-30 ENCOUNTER — Encounter: Payer: Self-pay | Admitting: Family

## 2021-10-30 ENCOUNTER — Ambulatory Visit (INDEPENDENT_AMBULATORY_CARE_PROVIDER_SITE_OTHER): Payer: BC Managed Care – PPO | Admitting: Family

## 2021-10-30 DIAGNOSIS — E538 Deficiency of other specified B group vitamins: Secondary | ICD-10-CM

## 2021-10-30 DIAGNOSIS — Z0000 Encounter for general adult medical examination without abnormal findings: Secondary | ICD-10-CM | POA: Diagnosis not present

## 2021-10-30 DIAGNOSIS — R739 Hyperglycemia, unspecified: Secondary | ICD-10-CM

## 2021-10-30 DIAGNOSIS — Z87410 Personal history of cervical dysplasia: Secondary | ICD-10-CM

## 2021-10-30 DIAGNOSIS — L659 Nonscarring hair loss, unspecified: Secondary | ICD-10-CM | POA: Diagnosis not present

## 2021-10-30 DIAGNOSIS — R202 Paresthesia of skin: Secondary | ICD-10-CM

## 2021-10-30 DIAGNOSIS — F988 Other specified behavioral and emotional disorders with onset usually occurring in childhood and adolescence: Secondary | ICD-10-CM

## 2021-10-30 DIAGNOSIS — N889 Noninflammatory disorder of cervix uteri, unspecified: Secondary | ICD-10-CM

## 2021-10-30 DIAGNOSIS — E611 Iron deficiency: Secondary | ICD-10-CM | POA: Diagnosis not present

## 2021-10-30 MED ORDER — AMPHETAMINE-DEXTROAMPHET ER 20 MG PO CP24: 20.0000 mg | ORAL_CAPSULE | Freq: Every day | ORAL | 0 refills | Status: DC

## 2021-10-30 NOTE — Progress Notes (Signed)
Subjective:     Patient ID: Barbara Barr, female    DOB: 08/30/1976, 46 y.o.   MRN: [id number]  Chief Complaint  Patient presents with   New Patient (Initial Visit)    Here to establish care    HPI  Reports that she has had some issues with her electrolytes (including hypomagnesemia).   Notes that she has occasional hot flashes and things go back for a minute, then it takes a minute to come back. Has gotten some worse over the last year. She is on ibuprofen.   Hx of abnormal pap smears- had leep procedure after first pregnancy.  Reports second leep after her second child.  Reports that she continues to follow up with GYN Josefa Half).  ADD- This has been managed by her PCP and she was seeing them every 3 months. She has been on medication since the age of 26.  States that it is well managed with current dose of adderall xr.   Denies any current concerns with anxiety.  OCD- reports that this is stable.      Health Maintenance Due  Topic Date Due   COVID-19 Vaccine (1) Never done   Pneumococcal Vaccine 47-43 Years old (1 - PCV) Never done   Hepatitis C Screening  Never done   INFLUENZA VACCINE  05/28/2021   PAP SMEAR-Modifier  10/30/2021    Past Medical History:  Diagnosis Date   ADHD (attention deficit hyperactivity disorder)    Allergic rhinitis, seasonal    Anemia    Anxiety    B12 deficiency 10/31/2021   Cervical dysplasia    high grade   Environmental allergies    Headache    History of gestational diabetes    Obsessive-compulsive disorder    PONV (postoperative nausea and vomiting)    Pre-diabetes    Wears glasses     Past Surgical History:  Procedure Laterality Date   COLPOSCOPY N/A 05/12/2018   Procedure: COLPOSCOPY;  Surgeon: Nunzio Cobbs, MD;  Location: [facility];  Service: Gynecology;  Laterality: N/A;   LAPAROSCOPIC CHOLECYSTECTOMY  2003   LEEP N/A 05/12/2018   Procedure: LOOP ELECTROSURGICAL EXCISION PROCEDURE  (LEEP) with ECC;  Surgeon: Nunzio Cobbs, MD;  Location: [facility];  Service: Gynecology;  Laterality: N/A;  with ECC   LEEP     2021   LEEP  2006   RHINOPLASTY  02/2018   TUBAL LIGATION Bilateral 2006    Family History  Problem Relation Age of Onset   Drug abuse Mother        "was a prostitute"   Bipolar disorder Mother    Dementia Maternal Aunt    Cancer Maternal Aunt    Cervical cancer Maternal Aunt    Cancer Maternal Aunt    ADD / ADHD Maternal Uncle    Stroke Maternal Uncle    Heart attack Maternal Uncle     Social History   Socioeconomic History   Marital status: Soil scientist    Spouse name: Not on file   Number of children: Not on file   Years of education: Not on file   Highest education level: Not on file  Occupational History   Not on file  Tobacco Use   Smoking status: Former    Packs/day: 0.50    Years: 25.00    Pack years: 12.50    Types: Cigarettes    Start date: 01/26/2021   Smokeless tobacco: Never  Vaping Use   Vaping Use: Every day  Substance and Sexual Activity   Alcohol use: No   Drug use: No   Sexual activity: Yes    Birth control/protection: Surgical  Other Topics Concern   Not on file  Social History Narrative   Adopted by her great aunt on her mother's side   Has a half sister with her biological mom   Works as an Financial planner   Lives with her boyfriend   2003- son Randall Hiss   2006- Daughter [name]   Enjoys building furniture/arts and crafts/fishing   Completed bachelor's degree   2 [employer]   Social Determinants of Radio broadcast assistant Strain: Not on Art therapist Insecurity: Not on file  Transportation Needs: Not on file  Physical Activity: Sufficiently Active   Days of Exercise per Week: 3 days   Minutes of Exercise per Session: 60 min  Stress: Not on file  Social Connections: Not on file  Intimate Partner Violence: Not on file    Outpatient Medications Prior to  Visit  Medication Sig Dispense Refill   fluticasone (FLONASE) 50 MCG/ACT nasal spray Place 1 spray into both nostrils 2 (two) times daily. 16 g 3   ibuprofen (ADVIL) 800 MG tablet Take 1 tablet (800 mg total) by mouth every 8 (eight) hours as needed. 30 tablet 0   metFORMIN (GLUCOPHAGE-XR) 500 MG 24 hr tablet      SUMAtriptan (IMITREX) 100 MG tablet Take by mouth.     amphetamine-dextroamphetamine (ADDERALL) 30 MG tablet Take 30 mg by mouth daily.     cyclobenzaprine (FLEXERIL) 10 MG tablet Take 1 tablet (10 mg total) by mouth 2 (two) times daily as needed for muscle spasms. 10 tablet 0   ibuprofen (ADVIL) 800 MG tablet Take by mouth.     azithromycin (ZITHROMAX) 250 MG tablet Take 1 tablet (250 mg total) by mouth daily. Take first 2 tablets together, then 1 every day until finished. 6 tablet 0   No facility-administered medications prior to visit.    Allergies  Allergen Reactions   Lexapro [Escitalopram] Hives   Penicillins Hives   Benadryl [Diphenhydramine] Rash   Codeine Itching    ROS See HPI    Objective:    Physical Exam Constitutional:      General: She is not in acute distress.    Appearance: Normal appearance. She is well-developed.  HENT:     Head: Normocephalic and atraumatic.     Right Ear: External ear normal.     Left Ear: External ear normal.  Eyes:     General: No scleral icterus. Neck:     Thyroid: No thyromegaly.  Cardiovascular:     Rate and Rhythm: Normal rate and regular rhythm.     Heart sounds: Normal heart sounds. No murmur heard. Pulmonary:     Effort: Pulmonary effort is normal. No respiratory distress.     Breath sounds: Normal breath sounds. No wheezing.  Musculoskeletal:     Cervical back: Neck supple.  Skin:    General: Skin is warm and dry.  Neurological:     General: No focal deficit present.     Mental Status: She is alert and oriented to person, place, and time.  Psychiatric:        Mood and Affect: Mood normal.        Behavior:  Behavior normal.        Thought Content: Thought content normal.  Judgment: Judgment normal.    BP 123/78 (BP Location: Right Arm, Patient Position: Sitting, Cuff Size: Small)    Pulse (!) 115    Temp 98.4 F (36.9 C) (Oral)    Resp 16    Ht 5' 4.5" (1.638 m)    Wt 192 lb (87.1 kg)    SpO2 99%    BMI 32.45 kg/m  Wt Readings from Last 3 Encounters:  10/30/21 192 lb (87.1 kg)  09/24/21 183 lb (83 kg)  08/25/20 164 lb (74.4 kg)       Assessment & Plan:   Problem List Items Addressed This Visit       Unprioritized   History of cervical dysplasia    Overdue for follow up with GYN. Will place referral to get her scheduled.       Relevant Orders   Ambulatory referral to Obstetrics / Gynecology   RESOLVED: Cervix abnormality   B12 deficiency    b12 level was low. Will initiate b12 injections.  See phone note.       ADD (attention deficit disorder) without hyperactivity    Stable.  Reviewed controlled substance registry. Pt signed contract today and a UDS has been sent.  Continue adderall xr at current dose.       Relevant Medications   amphetamine-dextroamphetamine (ADDERALL XR) 20 MG 24 hr capsule   Other Relevant Orders   DRUG MONITORING, PANEL 8 WITH CONFIRMATION, URINE   Other Visit Diagnoses     Hypomagnesemia    -  Primary   Relevant Orders   Comp Met (CMET) (Completed)   Magnesium (Completed)   Hyperglycemia       Relevant Orders   Hemoglobin A1c (Completed)   Iron deficiency       Relevant Orders   CBC with Differential/Platelet (Completed)   Iron (Completed)   Ferritin (Completed)   Hair loss       Relevant Orders   TSH (Completed)   Paresthesia       Relevant Orders   B12 and Folate Panel (Completed)   Preventative health care       Relevant Orders   Vitamin D (25 hydroxy) (Completed)       I have discontinued Derian A. Youngren's amphetamine-dextroamphetamine, azithromycin, cyclobenzaprine, and amphetamine-dextroamphetamine. I am also having  her start on amphetamine-dextroamphetamine. Additionally, I am having her maintain her fluticasone, ibuprofen, metFORMIN, and SUMAtriptan.  Meds ordered this encounter  Medications   amphetamine-dextroamphetamine (ADDERALL XR) 20 MG 24 hr capsule    Sig: Take 1 capsule (20 mg total) by mouth daily.    Dispense:  30 capsule    Refill:  0    Order Specific Question:   Supervising Provider    Answer:   Penni Homans A [[id number]]

## 2021-10-31 ENCOUNTER — Telehealth: Payer: Self-pay | Admitting: Family

## 2021-10-31 ENCOUNTER — Encounter: Payer: Self-pay | Admitting: Family

## 2021-10-31 DIAGNOSIS — E538 Deficiency of other specified B group vitamins: Secondary | ICD-10-CM

## 2021-10-31 DIAGNOSIS — Z87410 Personal history of cervical dysplasia: Secondary | ICD-10-CM | POA: Insufficient documentation

## 2021-10-31 HISTORY — DX: Deficiency of other specified B group vitamins: E53.8

## 2021-10-31 LAB — CBC WITH DIFFERENTIAL/PLATELET
Basophils Absolute: 0.1 10*3/uL (ref 0.0–0.1)
Basophils Relative: 0.8 % (ref 0.0–3.0)
Eosinophils Absolute: 0 10*3/uL (ref 0.0–0.7)
Eosinophils Relative: 0.5 % (ref 0.0–5.0)
HCT: 39 % (ref 36.0–46.0)
Hemoglobin: 12.9 g/dL (ref 12.0–15.0)
Lymphocytes Relative: 26.1 % (ref 12.0–46.0)
Lymphs Abs: 2.7 10*3/uL (ref 0.7–4.0)
MCHC: 33 g/dL (ref 30.0–36.0)
MCV: 85.5 fl (ref 78.0–100.0)
Monocytes Absolute: 0.6 10*3/uL (ref 0.1–1.0)
Monocytes Relative: 6.1 % (ref 3.0–12.0)
Neutro Abs: 6.8 10*3/uL (ref 1.4–7.7)
Neutrophils Relative %: 66.5 % (ref 43.0–77.0)
Platelets: 272 10*3/uL (ref 150.0–400.0)
RBC: 4.56 Mil/uL (ref 3.87–5.11)
RDW: 13.1 % (ref 11.5–15.5)
WBC: 10.2 10*3/uL (ref 4.0–10.5)

## 2021-10-31 LAB — B12 AND FOLATE PANEL
Folate: 5.9 ng/mL — ABNORMAL LOW (ref >5.9)
Vitamin B-12: 188 pg/mL — ABNORMAL LOW (ref 211–911)

## 2021-10-31 LAB — COMPREHENSIVE METABOLIC PANEL
ALT: 10 U/L (ref 0–35)
AST: 13 U/L (ref 0–37)
Albumin: 4.1 g/dL (ref 3.5–5.2)
Alkaline Phosphatase: 66 U/L (ref 39–117)
BUN: 13 mg/dL (ref 6–23)
CO2: 26 mEq/L (ref 19–32)
Calcium: 9.6 mg/dL (ref 8.4–10.5)
Chloride: 101 mEq/L (ref 96–112)
Creatinine, Ser: 0.72 mg/dL (ref 0.40–1.20)
GFR: 102.27 mL/min (ref >60.00)
Glucose, Bld: 88 mg/dL (ref 70–99)
Potassium: 3.7 mEq/L (ref 3.5–5.1)
Sodium: 135 mEq/L (ref 135–145)
Total Bilirubin: 0.3 mg/dL (ref 0.2–1.2)
Total Protein: 7.2 g/dL (ref 6.0–8.3)

## 2021-10-31 LAB — FERRITIN: Ferritin: 58.1 ng/mL (ref 10.0–291.0)

## 2021-10-31 LAB — MAGNESIUM: Magnesium: 1.8 mg/dL (ref 1.5–2.5)

## 2021-10-31 LAB — TSH: TSH: 1.6 u[IU]/mL (ref 0.35–5.50)

## 2021-10-31 LAB — VITAMIN D 25 HYDROXY (VIT D DEFICIENCY, FRACTURES): VITD: 30.57 ng/mL (ref 30.00–100.00)

## 2021-10-31 LAB — IRON: Iron: 47 ug/dL (ref 42–145)

## 2021-10-31 LAB — HEMOGLOBIN A1C: Hgb A1c MFr Bld: 5.9 % (ref 4.6–6.5)

## 2021-10-31 MED ORDER — VITAMIN D3 75 MCG (3000 UT) PO TABS: 1.0000 | ORAL_TABLET | Freq: Every day | ORAL | Status: AC

## 2021-10-31 MED ORDER — FOLIC ACID 1 MG PO TABS: 1.0000 mg | ORAL_TABLET | Freq: Every day | ORAL | Status: AC

## 2021-10-31 NOTE — Telephone Encounter (Signed)
Patient advised of results and provider's advise to start otc vit D 3000 and Foilic acid 1 mg. She was scheduled to come in for B12 #1 of 4 weekly 11-02-20 at 2 pm.

## 2021-10-31 NOTE — Assessment & Plan Note (Signed)
Stable.  Reviewed controlled substance registry. Pt signed contract today and a UDS has been sent.  Continue adderall xr at current dose.

## 2021-10-31 NOTE — Telephone Encounter (Signed)
Please advise pt that b12 level is low.  This can contribute to numbness/tingling, fatigue.  Folate is low normal.  I would recommend that she begin b12 injections 1000 mcg IM weekly for 4 weeks, then monthly.  Add OTC Folic Acid 1mg  once daily.  Vit D low normal. Recommend vit D 3000 iu once daily OTC.    A1C is high normal.   Other lab work looks good including iron, thyroid.

## 2021-10-31 NOTE — Assessment & Plan Note (Signed)
Overdue for follow up with GYN. Will place referral to get her scheduled.

## 2021-10-31 NOTE — Assessment & Plan Note (Addendum)
b12 level was low. Will initiate b12 injections.  See phone note.

## 2021-11-01 LAB — DRUG MONITORING, PANEL 8 WITH CONFIRMATION, URINE
6 Acetylmorphine: NEGATIVE ng/mL (ref <10)
Alcohol Metabolites: NEGATIVE ng/mL (ref <500)
Amphetamine: 3696 ng/mL — ABNORMAL HIGH (ref <250)
Amphetamines: POSITIVE ng/mL — AB (ref <500)
Benzodiazepines: NEGATIVE ng/mL (ref <100)
Buprenorphine, Urine: NEGATIVE ng/mL (ref <5)
Cocaine Metabolite: NEGATIVE ng/mL (ref <150)
Creatinine: 74.7 mg/dL (ref >=20.0)
MDMA: NEGATIVE ng/mL (ref <500)
Marijuana Metabolite: NEGATIVE ng/mL (ref <20)
Methamphetamine: NEGATIVE ng/mL (ref <250)
Opiates: NEGATIVE ng/mL (ref <100)
Oxidant: NEGATIVE ug/mL (ref <200)
Oxycodone: NEGATIVE ng/mL (ref <100)
pH: 6.8 (ref 4.5–9.0)

## 2021-11-01 LAB — DM TEMPLATE

## 2021-11-02 ENCOUNTER — Ambulatory Visit (INDEPENDENT_AMBULATORY_CARE_PROVIDER_SITE_OTHER): Payer: BC Managed Care – PPO

## 2021-11-02 DIAGNOSIS — E538 Deficiency of other specified B group vitamins: Secondary | ICD-10-CM

## 2021-11-02 MED ORDER — CYANOCOBALAMIN 1000 MCG/ML IJ SOLN
1000.0000 ug | Freq: Once | INTRAMUSCULAR | Status: AC
  Administered 2021-11-02: 1000 ug via INTRAMUSCULAR

## 2021-11-02 NOTE — Progress Notes (Signed)
Barbara Barr is a 46 y.o. female presents to the office today for 1/4 Weekly B12 injection, per physician's orders. Original order: [name] " I would recommend that she begin b12 injections 1000 mcg IM weekly for 4 weeks, then monthly." B12 1000 mcg was administered L deltoid (location) today. Patient tolerated injection. Patient next injection due: 1 week, appt made Yes  Creft, Melton Alar L

## 2021-11-07 ENCOUNTER — Other Ambulatory Visit (HOSPITAL_BASED_OUTPATIENT_CLINIC_OR_DEPARTMENT_OTHER): Payer: Self-pay

## 2021-11-07 ENCOUNTER — Encounter: Payer: Self-pay | Admitting: Family

## 2021-11-07 DIAGNOSIS — F988 Other specified behavioral and emotional disorders with onset usually occurring in childhood and adolescence: Secondary | ICD-10-CM

## 2021-11-07 MED ORDER — AMPHETAMINE-DEXTROAMPHET ER 20 MG PO CP24
20.0000 mg | ORAL_CAPSULE | Freq: Every day | ORAL | 0 refills | Status: DC
  Filled 2021-11-07: qty 30, 30d supply, fill #0

## 2021-11-07 NOTE — Telephone Encounter (Signed)
Please cancel adderall xr Rx at [facility]- they are out of stock and I resent downstairs instead.

## 2021-11-07 NOTE — Telephone Encounter (Signed)
Rx cancelled at [facility] in [place]

## 2021-11-08 ENCOUNTER — Other Ambulatory Visit (HOSPITAL_BASED_OUTPATIENT_CLINIC_OR_DEPARTMENT_OTHER): Payer: Self-pay

## 2021-11-09 ENCOUNTER — Ambulatory Visit (INDEPENDENT_AMBULATORY_CARE_PROVIDER_SITE_OTHER): Payer: BC Managed Care – PPO

## 2021-11-09 DIAGNOSIS — E538 Deficiency of other specified B group vitamins: Secondary | ICD-10-CM | POA: Diagnosis not present

## 2021-11-09 MED ORDER — CYANOCOBALAMIN 1000 MCG/ML IJ SOLN
1000.0000 ug | Freq: Once | INTRAMUSCULAR | Status: AC
  Administered 2021-11-09: 1000 ug via INTRAMUSCULAR

## 2021-11-09 NOTE — Progress Notes (Signed)
Pt here today for 2/4 of weekly B12 injections Pt was given 1,000 mcg of B12 in right deltoid. Pt tolerated well  Pt next appointment is scheduled for 11/16/21

## 2021-11-16 ENCOUNTER — Ambulatory Visit (INDEPENDENT_AMBULATORY_CARE_PROVIDER_SITE_OTHER): Payer: BC Managed Care – PPO

## 2021-11-16 DIAGNOSIS — E538 Deficiency of other specified B group vitamins: Secondary | ICD-10-CM

## 2021-11-16 MED ORDER — CYANOCOBALAMIN 1000 MCG/ML IJ SOLN
1000.0000 ug | Freq: Once | INTRAMUSCULAR | Status: AC
  Administered 2021-11-16: 1000 ug via INTRAMUSCULAR

## 2021-11-16 NOTE — Progress Notes (Signed)
Pt here today for 3/4 of weekly B12 injections Pt was given 1,000 mcg of B12 in Left deltoid. Pt tolerated well   Pt next appointment is scheduled for 11/23/21

## 2021-11-21 ENCOUNTER — Ambulatory Visit (INDEPENDENT_AMBULATORY_CARE_PROVIDER_SITE_OTHER): Payer: Self-pay | Admitting: Podiatry

## 2021-11-21 DIAGNOSIS — Z91199 Patient's noncompliance with other medical treatment and regimen due to unspecified reason: Secondary | ICD-10-CM

## 2021-11-21 NOTE — Progress Notes (Signed)
No show

## 2021-11-23 ENCOUNTER — Ambulatory Visit (INDEPENDENT_AMBULATORY_CARE_PROVIDER_SITE_OTHER): Payer: BC Managed Care – PPO

## 2021-11-23 DIAGNOSIS — E538 Deficiency of other specified B group vitamins: Secondary | ICD-10-CM | POA: Diagnosis not present

## 2021-11-23 MED ORDER — CYANOCOBALAMIN 1000 MCG/ML IJ SOLN
1000.0000 ug | Freq: Once | INTRAMUSCULAR | Status: AC
  Administered 2021-11-23: 1000 ug via INTRAMUSCULAR

## 2021-11-23 NOTE — Progress Notes (Signed)
Pt here today for 4/4 of weekly B12 injections Pt was given 1,000 mcg of B12 in Right deltoid. Pt tolerated well   Pt next appointment is scheduled for 12/21/2021 Patient will now start doing monthly B 12 injections per PCP.

## 2021-12-07 ENCOUNTER — Encounter: Payer: BC Managed Care – PPO | Admitting: Family

## 2021-12-14 ENCOUNTER — Encounter: Payer: Self-pay | Admitting: Family

## 2021-12-14 ENCOUNTER — Ambulatory Visit (INDEPENDENT_AMBULATORY_CARE_PROVIDER_SITE_OTHER): Payer: BC Managed Care – PPO | Admitting: Family

## 2021-12-14 ENCOUNTER — Telehealth: Payer: Self-pay

## 2021-12-14 ENCOUNTER — Other Ambulatory Visit (HOSPITAL_BASED_OUTPATIENT_CLINIC_OR_DEPARTMENT_OTHER): Payer: Self-pay

## 2021-12-14 VITALS — BP 116/80 | HR 99 | Temp 98.4°F | Resp 16 | Ht 64.0 in | Wt 192.0 lb

## 2021-12-14 DIAGNOSIS — Z8742 Personal history of other diseases of the female genital tract: Secondary | ICD-10-CM

## 2021-12-14 DIAGNOSIS — E538 Deficiency of other specified B group vitamins: Secondary | ICD-10-CM | POA: Diagnosis not present

## 2021-12-14 DIAGNOSIS — E669 Obesity, unspecified: Secondary | ICD-10-CM

## 2021-12-14 DIAGNOSIS — Z6836 Body mass index (BMI) 36.0-36.9, adult: Secondary | ICD-10-CM

## 2021-12-14 DIAGNOSIS — Z1231 Encounter for screening mammogram for malignant neoplasm of breast: Secondary | ICD-10-CM

## 2021-12-14 DIAGNOSIS — K589 Irritable bowel syndrome without diarrhea: Secondary | ICD-10-CM | POA: Diagnosis not present

## 2021-12-14 DIAGNOSIS — Z1159 Encounter for screening for other viral diseases: Secondary | ICD-10-CM

## 2021-12-14 DIAGNOSIS — Z0000 Encounter for general adult medical examination without abnormal findings: Secondary | ICD-10-CM

## 2021-12-14 DIAGNOSIS — Z6832 Body mass index (BMI) 32.0-32.9, adult: Secondary | ICD-10-CM | POA: Diagnosis not present

## 2021-12-14 DIAGNOSIS — F988 Other specified behavioral and emotional disorders with onset usually occurring in childhood and adolescence: Secondary | ICD-10-CM

## 2021-12-14 MED ORDER — LUBIPROSTONE 8 MCG PO CAPS: 8.0000 ug | ORAL_CAPSULE | Freq: Two times a day (BID) | ORAL | 2 refills | Status: DC

## 2021-12-14 MED ORDER — AMPHETAMINE-DEXTROAMPHET ER 25 MG PO CP24
25.0000 mg | ORAL_CAPSULE | ORAL | 0 refills | Status: DC
  Filled 2021-12-14: qty 30, 30d supply, fill #0

## 2021-12-14 NOTE — Progress Notes (Signed)
Subjective:     Patient ID: Barbara Barr, female    DOB: 1975/11/12, 46 y.o.   MRN: [id number]  Chief Complaint  Patient presents with   Annual Exam    Here for Annual Exam     HPI Patient is in today for cpx.  Immunizations: Tdap 2016, declines flu shot. Declines covid vaccines.  Diet:  healthy Wt Readings from Last 3 Encounters:  12/14/21 192 lb (87.1 kg)  10/30/21 192 lb (87.1 kg)  09/24/21 183 lb (83 kg)  Exercise: bike/walk, 5 days a week.  Pap Smear:  due Mammogram: due (never had one) Vision: up to date Dental: up to date  B12 deficiency- notes overall increase in her energy.  Folic acid is also helping. Taking vit D.   ADHD- stable on Adderall xr.   Chronic constipation.   Health Maintenance Due  Topic Date Due   Hepatitis C Screening  Never done   PAP SMEAR-Modifier  10/30/2021    Past Medical History:  Diagnosis Date   ADHD (attention deficit hyperactivity disorder)    Allergic rhinitis, seasonal    Anemia    Anxiety    B12 deficiency 10/31/2021   Cervical dysplasia    high grade   Environmental allergies    Headache    History of gestational diabetes    Obsessive-compulsive disorder    PONV (postoperative nausea and vomiting)    Pre-diabetes    Wears glasses     Past Surgical History:  Procedure Laterality Date   COLPOSCOPY N/A 05/12/2018   Procedure: COLPOSCOPY;  Surgeon: Patton Salles, MD;  Location: [facility];  Service: Gynecology;  Laterality: N/A;   LAPAROSCOPIC CHOLECYSTECTOMY  2003   LEEP N/A 05/12/2018   Procedure: LOOP ELECTROSURGICAL EXCISION PROCEDURE (LEEP) with ECC;  Surgeon: Patton Salles, MD;  Location: [facility];  Service: Gynecology;  Laterality: N/A;  with ECC   LEEP     2021   LEEP  2006   RHINOPLASTY  02/2018   TUBAL LIGATION Bilateral 2006    Family History  Problem Relation Age of Onset   Drug abuse Mother        "was a prostitute"   Bipolar  disorder Mother    Dementia Maternal Aunt    Cancer Maternal Aunt    Cervical cancer Maternal Aunt    Cancer Maternal Aunt    ADD / ADHD Maternal Uncle    Stroke Maternal Uncle    Heart attack Maternal Uncle     Social History   Socioeconomic History   Marital status: Media planner    Spouse name: Not on file   Number of children: Not on file   Years of education: Not on file   Highest education level: Not on file  Occupational History   Not on file  Tobacco Use   Smoking status: Former    Packs/day: 0.50    Years: 25.00    Pack years: 12.50    Types: Cigarettes    Start date: 01/26/2021   Smokeless tobacco: Never  Vaping Use   Vaping Use: Every day  Substance and Sexual Activity   Alcohol use: No   Drug use: No   Sexual activity: Yes    Birth control/protection: Surgical  Other Topics Concern   Not on file  Social History Narrative   Adopted by her great aunt on her mother's side   Has a half sister with her biological mom  Works as an Engineer, civil (consulting)   Lives with her boyfriend   2003- son [name]   2006- Daughter [name]   Enjoys building furniture/arts and crafts/fishing   Completed bachelor's degree   2 [employer]   Social Determinants of Corporate investment banker Strain: Not on [employer] Insecurity: Not on file  Transportation Needs: Not on file  Physical Activity: Sufficiently Active   Days of Exercise per Week: 3 days   Minutes of Exercise per Session: 60 min  Stress: Not on file  Social Connections: Not on file  Intimate Partner Violence: Not on file    Outpatient Medications Prior to Visit  Medication Sig Dispense Refill   Cholecalciferol (VITAMIN D3) 75 MCG (3000 UT) TABS Take 1 tablet by mouth daily. 30 tablet    fluticasone (FLONASE) 50 MCG/ACT nasal spray Place 1 spray into both nostrils 2 (two) times daily. 16 g 3   folic acid (FOLVITE) 1 MG tablet Take 1 tablet (1 mg total) by mouth daily.     ibuprofen (ADVIL) 800 MG  tablet Take 1 tablet (800 mg total) by mouth every 8 (eight) hours as needed. 30 tablet 0   metFORMIN (GLUCOPHAGE-XR) 500 MG 24 hr tablet      SUMAtriptan (IMITREX) 100 MG tablet Take by mouth.     amphetamine-dextroamphetamine (ADDERALL XR) 20 MG 24 hr capsule Take 1 capsule (20 mg total) by mouth daily. 30 capsule 0   No facility-administered medications prior to visit.    Allergies  Allergen Reactions   Lexapro [Escitalopram] Hives   Penicillins Hives   Benadryl [Diphenhydramine] Rash   Codeine Itching    Review of Systems  Constitutional:  Negative for weight loss.  HENT:  Negative for congestion and hearing loss.   Eyes:  Negative for blurred vision.  Respiratory:  Negative for cough.   Cardiovascular:  Negative for leg swelling.  Gastrointestinal:  Positive for constipation (chronic- uses laxatives prn). Negative for diarrhea.  Genitourinary:  Negative for dysuria and frequency.  Musculoskeletal:  Negative for joint pain and myalgias.  Skin:  Negative for rash.  Neurological:  Negative for headaches.  Psychiatric/Behavioral:         Denies depression/anxiety      Objective:    Physical Exam Constitutional:      General: She is not in acute distress.    Appearance: Normal appearance. She is well-developed.  HENT:     Head: Normocephalic and atraumatic.     Right Ear: Tympanic membrane, ear canal and external ear normal.     Left Ear: Tympanic membrane, ear canal and external ear normal.  Eyes:     General: No scleral icterus. Neck:     Thyroid: No thyromegaly.  Cardiovascular:     Rate and Rhythm: Normal rate and regular rhythm.     Heart sounds: Normal heart sounds. No murmur heard. Pulmonary:     Effort: Pulmonary effort is normal. No respiratory distress.     Breath sounds: Normal breath sounds. No wheezing.  Abdominal:     General: There is no distension.     Palpations: Abdomen is soft.     Tenderness: There is no abdominal tenderness.   Musculoskeletal:     Cervical back: Neck supple.  Skin:    General: Skin is warm and dry.  Neurological:     Mental Status: She is alert and oriented to person, place, and time.  Psychiatric:        Mood and Affect: Mood  normal.        Behavior: Behavior normal.        Thought Content: Thought content normal.        Judgment: Judgment normal.    BP 116/80 (BP Location: Right Arm, Patient Position: Sitting, Cuff Size: Normal)    Pulse 99    Temp 98.4 F (36.9 C) (Oral)    Resp 16    Ht 5\' 4"  (1.626 m)    Wt 192 lb (87.1 kg)    SpO2 99%    BMI 32.96 kg/m  Wt Readings from Last 3 Encounters:  12/14/21 192 lb (87.1 kg)  10/30/21 192 lb (87.1 kg)  09/24/21 183 lb (83 kg)       Assessment & Plan:   Problem List Items Addressed This Visit       Unprioritized   Preventative health care    Discussed healthy diet, exercise, weight loss.  Refer for mammo, gyn for pap given her hx. Declines covid/flu shot. Check hep c.       IBS (irritable bowel syndrome)    Chronic constipation- trial of amitiza.       Relevant Medications   lubiprostone (AMITIZA) 8 MCG capsule   Class 2 obesity with body mass index (BMI) of 36.0 to 36.9 in adult    Pt counseled on diet/exercise/weight loss.       Relevant Medications   amphetamine-dextroamphetamine (ADDERALL XR) 25 MG 24 hr capsule   B12 deficiency    Feeling much better on b12 injections, continue same, obtain follow up level.       Relevant Orders   B12 and Folate Panel (Completed)   ADD (attention deficit disorder) without hyperactivity    Stable on Adderall xr.  Will send 25mg  instead of 20 due to shortage of 20mg  dose.       Other Visit Diagnoses     History of abnormal cervical Pap smear    -  Primary   Relevant Orders   Ambulatory referral to Obstetrics / Gynecology   Encounter for screening mammogram for malignant neoplasm of breast       Relevant Orders   MM 3D SCREEN BREAST BILATERAL   Need for hepatitis C screening  test       Relevant Orders   Hepatitis C Antibody   Class 1 obesity without serious comorbidity with body mass index (BMI) of 32.0 to 32.9 in adult, unspecified obesity type       Relevant Medications   amphetamine-dextroamphetamine (ADDERALL XR) 25 MG 24 hr capsule   Other Relevant Orders   Lipid panel (Completed)       I have discontinued Rolande A. Lingo's amphetamine-dextroamphetamine. I am also having her start on lubiprostone and amphetamine-dextroamphetamine. Additionally, I am having her maintain her fluticasone, ibuprofen, metFORMIN, SUMAtriptan, Vitamin D3, and folic acid.  Meds ordered this encounter  Medications   lubiprostone (AMITIZA) 8 MCG capsule    Sig: Take 1 capsule (8 mcg total) by mouth 2 (two) times daily with a meal.    Dispense:  60 capsule    Refill:  2    Order Specific Question:   Supervising Provider    Answer:   09/26/21 A [[id number]]   amphetamine-dextroamphetamine (ADDERALL XR) 25 MG 24 hr capsule    Sig: Take 1 capsule by mouth every morning.    Dispense:  30 capsule    Refill:  0    Order Specific Question:   Supervising Provider    Answer:  [place], Spelter [[id number]]

## 2021-12-14 NOTE — Patient Instructions (Addendum)
Please begin amitiza twice daily for constipation.  Change Adderall xr to 25mg .  Please continue your work on healthy diet, exercise and weight loss.

## 2021-12-14 NOTE — Telephone Encounter (Signed)
PA initiated via Covermymeds; KEY: [id number]. Awaiting determination.

## 2021-12-15 ENCOUNTER — Telehealth (HOSPITAL_BASED_OUTPATIENT_CLINIC_OR_DEPARTMENT_OTHER): Payer: Self-pay

## 2021-12-15 DIAGNOSIS — Z113 Encounter for screening for infections with a predominantly sexual mode of transmission: Secondary | ICD-10-CM | POA: Insufficient documentation

## 2021-12-15 DIAGNOSIS — Z0000 Encounter for general adult medical examination without abnormal findings: Secondary | ICD-10-CM | POA: Insufficient documentation

## 2021-12-15 LAB — LIPID PANEL
Cholesterol: 181 mg/dL (ref <200)
HDL: 47 mg/dL — ABNORMAL LOW (ref >=50)
LDL Cholesterol (Calc): 112 mg/dL (calc) — ABNORMAL HIGH
Non-HDL Cholesterol (Calc): 134 mg/dL (calc) — ABNORMAL HIGH (ref <130)
Total CHOL/HDL Ratio: 3.9 (calc) (ref <5.0)
Triglycerides: 109 mg/dL (ref <150)

## 2021-12-15 LAB — B12 AND FOLATE PANEL
Folate: 17.3 ng/mL
Vitamin B-12: 396 pg/mL (ref 200–1100)

## 2021-12-15 NOTE — Assessment & Plan Note (Signed)
Chronic constipation- trial of amitiza.

## 2021-12-15 NOTE — Assessment & Plan Note (Signed)
Stable on Adderall xr.  Will send 25mg  instead of 20 due to shortage of 20mg  dose.

## 2021-12-15 NOTE — Assessment & Plan Note (Signed)
Pt counseled on diet/exercise/weight loss.

## 2021-12-15 NOTE — Assessment & Plan Note (Signed)
Feeling much better on b12 injections, continue same, obtain follow up level.

## 2021-12-15 NOTE — Assessment & Plan Note (Signed)
Discussed healthy diet, exercise, weight loss.  Refer for mammo, gyn for pap given her hx. Declines covid/flu shot. Check hep c.

## 2021-12-17 LAB — HEPATITIS C ANTIBODY
Hepatitis C Ab: NONREACTIVE
SIGNAL TO CUT-OFF: <0.02 (ref <1.00)

## 2021-12-17 NOTE — Telephone Encounter (Signed)
Received denial letter.  Amitiza is approved for members who have long term constipation of unknown causes (chronic idiopathic constipation) when Trulance has been tried and did not work or was not tolerated.  Do you want to change to Trulance?

## 2021-12-17 NOTE — Telephone Encounter (Signed)
PA denied. Awaiting denial information.  °

## 2021-12-18 MED ORDER — TRULANCE 3 MG PO TABS: 1.0000 | ORAL_TABLET | Freq: Every day | ORAL | 2 refills | Status: DC

## 2021-12-18 NOTE — Telephone Encounter (Signed)
Please advise pt that I sent rx for Trulance instead of amitiza as it is on her formulary.

## 2021-12-19 ENCOUNTER — Other Ambulatory Visit: Payer: Self-pay

## 2021-12-19 ENCOUNTER — Telehealth: Payer: Self-pay

## 2021-12-19 MED ORDER — TRULANCE 3 MG PO TABS: 1.0000 | ORAL_TABLET | Freq: Every day | ORAL | 2 refills | Status: DC

## 2021-12-19 NOTE — Telephone Encounter (Signed)
Pt aware and resent to [facility] in [place]

## 2021-12-19 NOTE — Telephone Encounter (Signed)
PA initiated via Covermymeds; KEY: [id number]. PA approved.   Effective from 12/19/2021 through 12/18/2022

## 2021-12-21 ENCOUNTER — Ambulatory Visit (INDEPENDENT_AMBULATORY_CARE_PROVIDER_SITE_OTHER): Payer: BC Managed Care – PPO

## 2021-12-21 DIAGNOSIS — E538 Deficiency of other specified B group vitamins: Secondary | ICD-10-CM

## 2021-12-21 MED ORDER — CYANOCOBALAMIN 1000 MCG/ML IJ SOLN
1000.0000 ug | Freq: Once | INTRAMUSCULAR | Status: AC
  Administered 2021-12-21: 1000 ug via INTRAMUSCULAR

## 2021-12-21 NOTE — Progress Notes (Signed)
Barbara Barr is a 46 y.o. female presents to the office today for Monthly B12 injection, per physician's orders. Original order: [name] " I would recommend that she begin b12 injections 1000 mcg IM weekly for 4 weeks, then monthly." B12 1000 mcg was administered L deltoid today. Patient tolerated injection. Patient next injection due: 1 month, appt made: pt will get next injection at scheduled OV.   --6 week f/u scheduled with Billy pending on 01/18/22  Creft, Feliberto Harts

## 2021-12-27 ENCOUNTER — Encounter (HOSPITAL_BASED_OUTPATIENT_CLINIC_OR_DEPARTMENT_OTHER): Payer: Self-pay

## 2021-12-27 ENCOUNTER — Other Ambulatory Visit: Payer: Self-pay

## 2021-12-27 ENCOUNTER — Ambulatory Visit (HOSPITAL_BASED_OUTPATIENT_CLINIC_OR_DEPARTMENT_OTHER)
Admission: RE | Admit: 2021-12-27 | Discharge: 2021-12-27 | Disposition: A | Discharge status: Home / Self Care | Payer: BC Managed Care – PPO | Source: Ambulatory Visit | Attending: Family | Admitting: Family

## 2021-12-27 DIAGNOSIS — Z1231 Encounter for screening mammogram for malignant neoplasm of breast: Secondary | ICD-10-CM | POA: Insufficient documentation

## 2022-01-18 ENCOUNTER — Ambulatory Visit (INDEPENDENT_AMBULATORY_CARE_PROVIDER_SITE_OTHER): Payer: BC Managed Care – PPO | Admitting: Family

## 2022-01-18 ENCOUNTER — Other Ambulatory Visit (HOSPITAL_BASED_OUTPATIENT_CLINIC_OR_DEPARTMENT_OTHER): Payer: Self-pay

## 2022-01-18 VITALS — BP 119/74 | HR 102 | Temp 98.5°F | Resp 16 | Wt 192.0 lb

## 2022-01-18 DIAGNOSIS — K589 Irritable bowel syndrome without diarrhea: Secondary | ICD-10-CM

## 2022-01-18 DIAGNOSIS — M79671 Pain in right foot: Secondary | ICD-10-CM

## 2022-01-18 DIAGNOSIS — E538 Deficiency of other specified B group vitamins: Secondary | ICD-10-CM | POA: Diagnosis not present

## 2022-01-18 DIAGNOSIS — Z6836 Body mass index (BMI) 36.0-36.9, adult: Secondary | ICD-10-CM

## 2022-01-18 DIAGNOSIS — F988 Other specified behavioral and emotional disorders with onset usually occurring in childhood and adolescence: Secondary | ICD-10-CM | POA: Diagnosis not present

## 2022-01-18 DIAGNOSIS — G8929 Other chronic pain: Secondary | ICD-10-CM

## 2022-01-18 DIAGNOSIS — E669 Obesity, unspecified: Secondary | ICD-10-CM

## 2022-01-18 DIAGNOSIS — R635 Abnormal weight gain: Secondary | ICD-10-CM

## 2022-01-18 MED ORDER — CYANOCOBALAMIN 1000 MCG/ML IJ SOLN
1000.0000 ug | Freq: Once | INTRAMUSCULAR | Status: AC
  Administered 2022-01-18: 1000 ug via INTRAMUSCULAR

## 2022-01-18 MED ORDER — AMPHETAMINE-DEXTROAMPHET ER 25 MG PO CP24
25.0000 mg | ORAL_CAPSULE | ORAL | 0 refills | Status: DC
  Filled 2022-01-18: qty 30, 30d supply, fill #0

## 2022-01-18 NOTE — Assessment & Plan Note (Signed)
b12 shot today. 

## 2022-01-18 NOTE — Progress Notes (Signed)
? ?Subjective:  ? ?By signing my name below, I, Cassell Clement, attest that this documentation has been prepared under the direction and in the presence of Destani, Wamser NP, 01/18/2022   ? ? Patient ID: Barbara Barr, female    DOB: 02/02/1976, 46 y.o.   MRN: [id number] ? ?Chief Complaint  ?Patient presents with  ? Constipation  ?  Here for follow up  ? ? ?HPI ?Patient is in today for an office visit. ? ?Constipation - She is currently taking 3 MG of Trulance during the evening. Her bowl movements have been improving, she is now producing bowel movements every 2 to 3 days.  ? ?Refill - She is requesting a refill of 25 MG of Adderall XR and 3 MG of Trulance.  ? ?Bone Spur - She is requesting to be referred to a different Podiatrist to help her bone spur in the sole of her right foot. She states that it is difficult to walk and injections do not help.  ? ?OB/GYN - She reports to have an appointment with an OB/GYN specialist on 01/24/2022. ? ?Weight - She is requesting to see a Nutritionist. She is regularly exercising. She is also maintaining a healthy diet.  ?Wt Readings from Last 3 Encounters:  ?01/18/22 192 lb (87.1 kg)  ?12/14/21 192 lb (87.1 kg)  ?10/30/21 192 lb (87.1 kg)  ? ? ? ?Health Maintenance Due  ?Topic Date Due  ? PAP SMEAR-Modifier  10/30/2021  ? ? ?Past Medical History:  ?Diagnosis Date  ? ADHD (attention deficit hyperactivity disorder)   ? Allergic rhinitis, seasonal   ? Anemia   ? Anxiety   ? B12 deficiency 10/31/2021  ? Cervical dysplasia   ? high grade  ? Environmental allergies   ? Headache   ? History of gestational diabetes   ? Obsessive-compulsive disorder   ? PONV (postoperative nausea and vomiting)   ? Pre-diabetes   ? Wears glasses   ? ? ?Past Surgical History:  ?Procedure Laterality Date  ? COLPOSCOPY N/A 05/12/2018  ? Procedure: COLPOSCOPY;  Surgeon: Patton Salles, MD;  Location: [facility];  Service: Gynecology;  Laterality: N/A;  ? LAPAROSCOPIC  CHOLECYSTECTOMY  2003  ? LEEP N/A 05/12/2018  ? Procedure: LOOP ELECTROSURGICAL EXCISION PROCEDURE (LEEP) with ECC;  Surgeon: Patton Salles, MD;  Location: [facility];  Service: Gynecology;  Laterality: N/A;  with ECC  ? LEEP    ? 2021  ? LEEP  2006  ? RHINOPLASTY  02/2018  ? TUBAL LIGATION Bilateral 2006  ? ? ?Family History  ?Problem Relation Age of Onset  ? Drug abuse Mother   ?     "was a prostitute"  ? Bipolar disorder Mother   ? Dementia Maternal Aunt   ? Cancer Maternal Aunt   ? Cervical cancer Maternal Aunt   ? Cancer Maternal Aunt   ? ADD / ADHD Maternal Uncle   ? Stroke Maternal Uncle   ? Heart attack Maternal Uncle   ? ? ?Social History  ? ?Socioeconomic History  ? Marital status: Media planner  ?  Spouse name: Not on file  ? Number of children: Not on file  ? Years of education: Not on file  ? Highest education level: Not on file  ?Occupational History  ? Not on file  ?Tobacco Use  ? Smoking status: Former  ?  Packs/day: 0.50  ?  Years: 25.00  ?  Pack years: 12.50  ?  Types: Cigarettes  ?  Start date: 01/26/2021  ? Smokeless tobacco: Never  ?Vaping Use  ? Vaping Use: Every day  ?Substance and Sexual Activity  ? Alcohol use: No  ? Drug use: No  ? Sexual activity: Yes  ?  Birth control/protection: Surgical  ?Other Topics Concern  ? Not on file  ?Social History Narrative  ? Adopted by her great aunt on her mother's side  ? Has a half sister with her biological mom  ? Works as an Engineer, civil (consulting)  ? Lives with her boyfriend  ? 2003- son [name]  ? 2006- Daughter [name]  ? Enjoys building furniture/arts and crafts/fishing  ? Completed bachelor's degree  ? 2 [employer]  ? ?Social Determinants of Health  ? ?Financial Resource Strain: Not on file  ?Food Insecurity: Not on file  ?Transportation Needs: Not on file  ?Physical Activity: Sufficiently Active  ? Days of Exercise per Week: 3 days  ? Minutes of Exercise per Session: 60 min  ?Stress: Not on file  ?Social  Connections: Not on file  ?Intimate Partner Violence: Not on file  ? ? ?Outpatient Medications Prior to Visit  ?Medication Sig Dispense Refill  ? Cholecalciferol (VITAMIN D3) 75 MCG (3000 UT) TABS Take 1 tablet by mouth daily. 30 tablet   ? fluticasone (FLONASE) 50 MCG/ACT nasal spray Place 1 spray into both nostrils 2 (two) times daily. 16 g 3  ? folic acid (FOLVITE) 1 MG tablet Take 1 tablet (1 mg total) by mouth daily.    ? ibuprofen (ADVIL) 800 MG tablet Take 1 tablet (800 mg total) by mouth every 8 (eight) hours as needed. 30 tablet 0  ? metFORMIN (GLUCOPHAGE-XR) 500 MG 24 hr tablet     ? Plecanatide (TRULANCE) 3 MG TABS Take 1 tablet by mouth daily. 30 tablet 2  ? SUMAtriptan (IMITREX) 100 MG tablet Take by mouth.    ? amphetamine-dextroamphetamine (ADDERALL XR) 25 MG 24 hr capsule Take 1 capsule by mouth every morning. 30 capsule 0  ? ?No facility-administered medications prior to visit.  ? ? ?Allergies  ?Allergen Reactions  ? Lexapro [Escitalopram] Hives  ? Penicillins Hives  ? Benadryl [Diphenhydramine] Rash  ? Codeine Itching  ? ? ?ROS ? ?   ?Objective:  ?  ?Physical Exam ?Constitutional:   ?   General: She is not in acute distress. ?   Appearance: Normal appearance. She is not ill-appearing.  ?HENT:  ?   Head: Normocephalic and atraumatic.  ?   Right Ear: External ear normal.  ?   Left Ear: External ear normal.  ?Eyes:  ?   Extraocular Movements: Extraocular movements intact.  ?   Pupils: Pupils are equal, round, and reactive to light.  ?Cardiovascular:  ?   Rate and Rhythm: Normal rate and regular rhythm.  ?   Heart sounds: Normal heart sounds. No murmur heard. ?  No gallop.  ?Pulmonary:  ?   Effort: Pulmonary effort is normal. No respiratory distress.  ?   Breath sounds: Normal breath sounds. No wheezing or rales.  ?Skin: ?   General: Skin is warm and dry.  ?Neurological:  ?   Mental Status: She is alert and oriented to person, place, and time.  ?Psychiatric:     ?   Mood and Affect: Mood normal.     ?    Behavior: Behavior normal.     ?   Judgment: Judgment normal.  ? ? ?BP 119/74 (BP Location: Right Arm, Patient Position:  Sitting, Cuff Size: Small)   Pulse (!) 102   Temp 98.5 ?F (36.9 ?C) (Oral)   Resp 16   Wt 192 lb (87.1 kg)   SpO2 100%   BMI 32.96 kg/m?  ?Wt Readings from Last 3 Encounters:  ?01/18/22 192 lb (87.1 kg)  ?12/14/21 192 lb (87.1 kg)  ?10/30/21 192 lb (87.1 kg)  ? ? ?   ?Assessment & Plan:  ? ?Problem List Items Addressed This Visit   ? ?  ? Unprioritized  ? IBS (irritable bowel syndrome)  ?  Much improved with the addition of Trulance.  Continue same.  ?  ?  ? Class 2 obesity with body mass index (BMI) of 36.0 to 36.9 in adult  ?  She is exercising and eating healthy. Concerned about weight gain. Requesting referral to nutrition. Referral placed.  ?  ?  ? Relevant Medications  ? amphetamine-dextroamphetamine (ADDERALL XR) 25 MG 24 hr capsule  ? B12 deficiency  ?  b12 shot today.  ?  ?  ? ADD (attention deficit disorder) without hyperactivity  ?  Stable on adderall. Continue same.  ?  ?  ? ?Other Visit Diagnoses   ? ? Heel pain, chronic, right    -  Primary  ? Relevant Orders  ? Ambulatory referral to Podiatry  ? class 2 obesity      ? Relevant Orders  ? Amb ref to Medical Nutrition Therapy-MNT  ? ?  ? ? ? ? ?Meds ordered this encounter  ?Medications  ? amphetamine-dextroamphetamine (ADDERALL XR) 25 MG 24 hr capsule  ?  Sig: Take 1 capsule by mouth every morning.  ?  Dispense:  30 capsule  ?  Refill:  0  ?  Order Specific Question:   Supervising Provider  ?  Answer:   Danise EdgeBLYTH, STACEY A [[id number]]  ? ? ?I, Lemont FillersMelissa S O'Sullivan, NP, personally preformed the services described in this documentation.  All medical record entries made by the scribe were at my direction and in my presence.  I have reviewed the chart and discharge instructions (if applicable) and agree that the record reflects my personal performance and is accurate and complete. 01/18/2022 ? ? ?I,Amber Collins,acting as a Neurosurgeonscribe for  Merck & CoMelissa S O'Sullivan, NP.,have documented all relevant documentation on the behalf of Lemont FillersMelissa S O'Sullivan, NP,as directed by  Lemont FillersMelissa S O'Sullivan, NP while in the presence of Lemont FillersMelissa S O'Sullivan, NP. ? ? ? ?Devina S O'Sulliva

## 2022-01-18 NOTE — Addendum Note (Signed)
Addended by: Wilford Corner on: 01/18/2022 02:19 PM ? ? Modules accepted: Orders ? ?

## 2022-01-18 NOTE — Assessment & Plan Note (Signed)
Stable on adderall. Continue same.  

## 2022-01-18 NOTE — Assessment & Plan Note (Signed)
Much improved with the addition of Trulance.  Continue same.  ?

## 2022-01-18 NOTE — Assessment & Plan Note (Signed)
She is exercising and eating healthy. Concerned about weight gain. Requesting referral to nutrition. Referral placed.  ?

## 2022-01-24 ENCOUNTER — Encounter (HOSPITAL_BASED_OUTPATIENT_CLINIC_OR_DEPARTMENT_OTHER): Payer: Self-pay

## 2022-01-24 ENCOUNTER — Ambulatory Visit (HOSPITAL_BASED_OUTPATIENT_CLINIC_OR_DEPARTMENT_OTHER): Payer: BC Managed Care – PPO | Admitting: Obstetrics & Gynecology

## 2022-02-04 ENCOUNTER — Encounter: Payer: Self-pay | Admitting: Family

## 2022-02-04 ENCOUNTER — Other Ambulatory Visit: Payer: Self-pay | Admitting: Family

## 2022-02-04 DIAGNOSIS — R739 Hyperglycemia, unspecified: Secondary | ICD-10-CM

## 2022-02-04 MED ORDER — BLOOD GLUCOSE MONITOR KIT: PACK | 0 refills | Status: DC

## 2022-02-04 MED ORDER — AMPHETAMINE-DEXTROAMPHET ER 25 MG PO CP24
25.0000 mg | ORAL_CAPSULE | ORAL | 0 refills | Status: DC
  Filled 2022-02-15: qty 30, 30d supply, fill #0

## 2022-02-04 NOTE — Telephone Encounter (Addendum)
Requesting:Adderall  ?Contract:10/30/21 ?UDS:10/30/21 ?Last Visit:01/18/22 ?Next Visit:02/15/22 ?Last Refill:10/30/21 ? ?Please Advise  ?

## 2022-02-04 NOTE — Addendum Note (Signed)
Addended by: Kem Boroughs D on: 02/04/2022 02:59 PM ? ? Modules accepted: Orders ? ?

## 2022-02-08 ENCOUNTER — Ambulatory Visit: Payer: BC Managed Care – PPO | Admitting: Podiatry

## 2022-02-15 ENCOUNTER — Other Ambulatory Visit (HOSPITAL_BASED_OUTPATIENT_CLINIC_OR_DEPARTMENT_OTHER): Payer: Self-pay

## 2022-02-15 ENCOUNTER — Other Ambulatory Visit (INDEPENDENT_AMBULATORY_CARE_PROVIDER_SITE_OTHER): Payer: BC Managed Care – PPO

## 2022-02-15 ENCOUNTER — Ambulatory Visit (INDEPENDENT_AMBULATORY_CARE_PROVIDER_SITE_OTHER): Payer: BC Managed Care – PPO

## 2022-02-15 DIAGNOSIS — E538 Deficiency of other specified B group vitamins: Secondary | ICD-10-CM | POA: Diagnosis not present

## 2022-02-15 DIAGNOSIS — R739 Hyperglycemia, unspecified: Secondary | ICD-10-CM | POA: Diagnosis not present

## 2022-02-15 LAB — HEMOGLOBIN A1C: Hgb A1c MFr Bld: 6.1 % (ref 4.6–6.5)

## 2022-02-15 MED ORDER — CYANOCOBALAMIN 1000 MCG/ML IJ SOLN
1000.0000 ug | Freq: Once | INTRAMUSCULAR | Status: AC
  Administered 2022-02-15: 1000 ug via INTRAMUSCULAR

## 2022-02-15 NOTE — Progress Notes (Signed)
Caoimhe Kemya Shed is a 46 y.o. female presents to the office today for Monthly B12 injection, per physician's orders. ? ?Original order: [name] " I would recommend that she begin b12 injections 1000 mcg IM weekly for 4 weeks, then monthly." ? ?B12 1000 mcg was administered L deltoid today.  ?Patient tolerated injection. ?Patient next injection due: 1 month, appt made: pt will get next injection at scheduled  ?

## 2022-02-19 ENCOUNTER — Other Ambulatory Visit (HOSPITAL_BASED_OUTPATIENT_CLINIC_OR_DEPARTMENT_OTHER): Payer: Self-pay

## 2022-03-05 ENCOUNTER — Other Ambulatory Visit: Payer: Self-pay | Admitting: Family

## 2022-03-08 ENCOUNTER — Ambulatory Visit: Payer: BC Managed Care – PPO | Admitting: Podiatry

## 2022-03-13 ENCOUNTER — Encounter: Payer: Self-pay | Admitting: Family

## 2022-03-13 DIAGNOSIS — H524 Presbyopia: Secondary | ICD-10-CM | POA: Diagnosis not present

## 2022-03-13 DIAGNOSIS — H527 Unspecified disorder of refraction: Secondary | ICD-10-CM | POA: Diagnosis not present

## 2022-03-13 DIAGNOSIS — R519 Headache, unspecified: Secondary | ICD-10-CM | POA: Diagnosis not present

## 2022-03-13 DIAGNOSIS — H04123 Dry eye syndrome of bilateral lacrimal glands: Secondary | ICD-10-CM | POA: Diagnosis not present

## 2022-03-13 DIAGNOSIS — Z62810 Personal history of physical and sexual abuse in childhood: Secondary | ICD-10-CM | POA: Diagnosis not present

## 2022-03-13 DIAGNOSIS — F32A Depression, unspecified: Secondary | ICD-10-CM | POA: Diagnosis not present

## 2022-03-13 DIAGNOSIS — R102 Pelvic and perineal pain: Secondary | ICD-10-CM | POA: Diagnosis not present

## 2022-03-13 DIAGNOSIS — H539 Unspecified visual disturbance: Secondary | ICD-10-CM

## 2022-03-13 DIAGNOSIS — N941 Unspecified dyspareunia: Secondary | ICD-10-CM | POA: Diagnosis not present

## 2022-03-13 NOTE — Addendum Note (Signed)
Addended by: [name] on: 03/13/2022 02:05 PM ? ? Modules accepted: Orders ? ?

## 2022-03-15 ENCOUNTER — Ambulatory Visit: Payer: BC Managed Care – PPO

## 2022-03-22 ENCOUNTER — Ambulatory Visit: Payer: BC Managed Care – PPO

## 2022-03-27 ENCOUNTER — Telehealth: Payer: Self-pay | Admitting: Family

## 2022-03-27 NOTE — Telephone Encounter (Signed)
See mychart.  

## 2022-03-29 ENCOUNTER — Ambulatory Visit (INDEPENDENT_AMBULATORY_CARE_PROVIDER_SITE_OTHER): Payer: BC Managed Care – PPO

## 2022-03-29 DIAGNOSIS — E538 Deficiency of other specified B group vitamins: Secondary | ICD-10-CM

## 2022-03-29 MED ORDER — CYANOCOBALAMIN 1000 MCG/ML IJ SOLN
1000.0000 ug | Freq: Once | INTRAMUSCULAR | Status: AC
  Administered 2022-03-29: 1000 ug via INTRAMUSCULAR

## 2022-03-29 NOTE — Progress Notes (Signed)
Barbara Barr is a 46 y.o. female presents to the office today for Monthly B12 injection, per physician's orders.  Original order: [name] " I would recommend that she begin b12 injections 1000 mcg IM weekly for 4 weeks, then monthly."  B12 1000 mcg was administered R deltoid today.  Patient tolerated injection. Patient next injection due: 1 month, appt made: pt will get next injection at scheduled

## 2022-04-03 ENCOUNTER — Encounter: Payer: Self-pay | Admitting: Family

## 2022-04-05 ENCOUNTER — Ambulatory Visit: Payer: BC Managed Care – PPO | Admitting: Family

## 2022-04-05 VITALS — BP 121/80 | HR 96 | Temp 98.6°F | Wt 199.0 lb

## 2022-04-05 DIAGNOSIS — H547 Unspecified visual loss: Secondary | ICD-10-CM | POA: Diagnosis not present

## 2022-04-05 DIAGNOSIS — T8089XA Other complications following infusion, transfusion and therapeutic injection, initial encounter: Secondary | ICD-10-CM

## 2022-04-05 NOTE — Progress Notes (Shared)
Subjective:   By signing my name below, I, Carylon Perches, attest that this documentation has been prepared under the direction and in the presence of [name] NP, 04/05/2022    Patient ID: Barbara Barr, female    DOB: Jul 25, 1976, 46 y.o.   MRN: [id number]  No chief complaint on file.   Eye Problem  Associated symptoms include blurred vision (Left Eye) and double vision (Left Eye).  Shoulder Pain    Patient is in today for an office visit  Right Arm Pain - She complains of pain in her right arm that appeared on 03/31/2022. She also had an associated symptom of a burning sensation in her right arm. She reports that she came in to get a vitamin B12 shot on 03/29/2022 and symptoms developed afterwards. As of today's visit, the burning sensation has been resolved but her pain is persistent. She states that pain is improving but is still tender to the touch. Pain typically radiates up her right shoulder. 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  She states that her eye specialist informed her that her left eye vision change was too dramatic to get prescribed new glasses. She went to an opthamologist and was reported to not find anything. She was then recommended to go to a neurologist. 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 She also sometimes goes cross eyed and sees double vision. She originally thought her sugars were elevated but found that they appeared normal. When she stands up she occasionally experiences symptoms. Symptoms also occasionally appears when she is driving. She states that she gets headaches after experiencing symptoms to which she takes 100 Mg of Imitrex to help relieve symptoms. With her current glasses, she states that she has trouble seeing. She also notes that when looking at neon  signs, vision appears fuzzy until she closes her left eye. She states that she feels like she is putting strain on her right eye from closing her left one often.    Health Maintenance Due  Topic Date Due   HIV Screening  Never done   PAP SMEAR-Modifier  10/30/2021    Past Medical History:  Diagnosis Date   ADHD (attention deficit hyperactivity disorder)    Allergic rhinitis, seasonal    Anemia    Anxiety    B12 deficiency 10/31/2021   Cervical dysplasia    high grade   Environmental allergies    Headache    History of gestational diabetes    Obsessive-compulsive disorder    PONV (postoperative nausea and vomiting)    Pre-diabetes    Wears glasses     Past Surgical History:  Procedure Laterality Date   COLPOSCOPY N/A 05/12/2018   Procedure: COLPOSCOPY;  Surgeon: Nunzio Cobbs, MD;  Location: [facility];  Service: Gynecology;  Laterality: N/A;   LAPAROSCOPIC CHOLECYSTECTOMY  2003   LEEP N/A 05/12/2018   Procedure: LOOP ELECTROSURGICAL EXCISION PROCEDURE (LEEP) with ECC;  Surgeon: Nunzio Cobbs, MD;  Location: [facility];  Service: Gynecology;  Laterality: N/A;  with ECC   LEEP     2021   LEEP  2006   RHINOPLASTY  02/2018   TUBAL LIGATION Bilateral 2006    Family History  Problem Relation Age of Onset   Drug abuse Mother        "  was a prostitute"   Bipolar disorder Mother    Dementia Maternal Aunt    Cancer Maternal Aunt    Cervical cancer Maternal Aunt    Cancer Maternal Aunt    ADD / ADHD Maternal Uncle    Stroke Maternal Uncle    Heart attack Maternal Uncle     Social History   Socioeconomic History   Marital status: Soil scientist    Spouse name: Not on file   Number of children: Not on file   Years of education: Not on file   Highest education level: Not on file  Occupational History   Not on file  Tobacco Use   Smoking status: Former    Packs/day: 0.50    Years: 25.00    Total pack years:  12.50    Types: Cigarettes    Start date: 01/26/2021   Smokeless tobacco: Never  Vaping Use   Vaping Use: Every day  Substance and Sexual Activity   Alcohol use: No   Drug use: No   Sexual activity: Yes    Birth control/protection: Surgical  Other Topics Concern   Not on file  Social History Narrative   Adopted by her great aunt on her mother's side   Has a half sister with her biological mom   Works as an Financial planner   Lives with her boyfriend   2003- son Randall Hiss   2006- Daughter [name]   Enjoys building furniture/arts and crafts/fishing   Completed bachelor's degree   2 [employer]   Social Determinants of Radio broadcast assistant Strain: Not on file  Food Insecurity: Not on file  Transportation Needs: Not on file  Physical Activity: Sufficiently Active (10/30/2021)   Exercise Vital Sign    Days of Exercise per Week: 3 days    Minutes of Exercise per Session: 60 min  Stress: Not on file  Social Connections: Not on file  Intimate Partner Violence: Not on file    Outpatient Medications Prior to Visit  Medication Sig Dispense Refill   amphetamine-dextroamphetamine (ADDERALL XR) 25 MG 24 hr capsule Take 1 capsule by mouth every morning. 30 capsule 0   blood glucose meter kit and supplies KIT Dispense based on patient and insurance preference. Use up to four times daily as directed. 1 each 0   Cholecalciferol (VITAMIN D3) 75 MCG (3000 UT) TABS Take 1 tablet by mouth daily. 30 tablet    fluticasone (FLONASE) 50 MCG/ACT nasal spray Place 1 spray into both nostrils 2 (two) times daily. 16 g 3   folic acid (FOLVITE) 1 MG tablet Take 1 tablet (1 mg total) by mouth daily.     ibuprofen (ADVIL) 800 MG tablet Take 1 tablet (800 mg total) by mouth every 8 (eight) hours as needed. 30 tablet 0   metFORMIN (GLUCOPHAGE-XR) 500 MG 24 hr tablet      ONETOUCH ULTRA test strip USE UP TO FOUR TIMES DAILY AS DIRECTED. 100 strip 2   Plecanatide (TRULANCE) 3 MG TABS Take 1  tablet by mouth daily. 30 tablet 2   SUMAtriptan (IMITREX) 100 MG tablet Take by mouth.     No facility-administered medications prior to visit.    Allergies  Allergen Reactions   Lexapro [Escitalopram] Hives   Penicillins Hives   Benadryl [Diphenhydramine] Rash   Codeine Itching    Review of Systems  Eyes:  Positive for blurred vision (Left Eye) and double vision (Left Eye).  Musculoskeletal:  Positive for joint pain (Right Shoulder) and  myalgias (Right Arm).       Objective:    Physical Exam Constitutional:      General: She is not in acute distress.    Appearance: Normal appearance. She is not ill-appearing.  HENT:     Head: Normocephalic and atraumatic.     Right Ear: External ear normal.     Left Ear: External ear normal.  Eyes:     Extraocular Movements: Extraocular movements intact.     Pupils: Pupils are equal, round, and reactive to light.  Cardiovascular:     Rate and Rhythm: Normal rate and regular rhythm.     Heart sounds: Normal heart sounds. No murmur heard.    No gallop.  Pulmonary:     Effort: Pulmonary effort is normal. No respiratory distress.     Breath sounds: Normal breath sounds. No wheezing or rales.  Skin:    General: Skin is warm and dry.  Neurological:     Mental Status: She is alert and oriented to person, place, and time.  Psychiatric:        Mood and Affect: Mood normal.        Behavior: Behavior normal.        Judgment: Judgment normal.     There were no vitals taken for this visit. Wt Readings from Last 3 Encounters:  01/18/22 192 lb (87.1 kg)  12/14/21 192 lb (87.1 kg)  10/30/21 192 lb (87.1 kg)       Assessment & Plan:   Problem List Items Addressed This Visit   None   No orders of the defined types were placed in this encounter.   I, Carylon Perches, personally preformed the services described in this documentation.  All medical record entries made by the scribe were at my direction and in my presence.  I have  reviewed the chart and discharge instructions (if applicable) and agree that the record reflects my personal performance and is accurate and complete. 04/05/2022  I,Amber Collins,acting as a scribe for Nance Pear, NP.,have documented all relevant documentation on the behalf of Barbara HEARLD, NP,as directed by  Nance Pear, NP while in the presence of Nance Pear, NP.  [employer]

## 2022-04-08 DIAGNOSIS — T8089XA Other complications following infusion, transfusion and therapeutic injection, initial encounter: Secondary | ICD-10-CM | POA: Insufficient documentation

## 2022-04-08 DIAGNOSIS — H547 Unspecified visual loss: Secondary | ICD-10-CM | POA: Insufficient documentation

## 2022-04-08 NOTE — Assessment & Plan Note (Signed)
New. No sign of abscess or cellulitis.  I suspect that a nerve may have been affected by the injection. Will continue to monitor and she will let me know if symptoms do not continue to improve.

## 2022-04-08 NOTE — Assessment & Plan Note (Signed)
New. Ophthalmologist recommended neuro referral. 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 Will obtain MR Brain and refer to neurology for further evaluation.

## 2022-04-09 ENCOUNTER — Telehealth (HOSPITAL_BASED_OUTPATIENT_CLINIC_OR_DEPARTMENT_OTHER): Payer: Self-pay

## 2022-04-09 ENCOUNTER — Encounter: Payer: Self-pay | Admitting: Neurology

## 2022-04-12 ENCOUNTER — Encounter: Payer: BC Managed Care – PPO | Attending: Family | Admitting: Dietician

## 2022-04-12 DIAGNOSIS — R7303 Prediabetes: Secondary | ICD-10-CM | POA: Diagnosis not present

## 2022-04-12 DIAGNOSIS — E669 Obesity, unspecified: Secondary | ICD-10-CM | POA: Diagnosis not present

## 2022-04-12 DIAGNOSIS — E538 Deficiency of other specified B group vitamins: Secondary | ICD-10-CM | POA: Insufficient documentation

## 2022-04-12 DIAGNOSIS — Z6836 Body mass index (BMI) 36.0-36.9, adult: Secondary | ICD-10-CM | POA: Insufficient documentation

## 2022-04-12 NOTE — Patient Instructions (Addendum)
Try cyanocobalamin  vitamin B-12 orally  (one that is dissolvable or sublingual). Some do well with a Methly folate that frequently has vitamin B-12 in it as well. Try taking the magnesium at night to see if this helps you sleep better.    Country Life Magnesium caps 3 (300 mg tablets) and contains the following: (as magnesium oxide, magnesium aspartate, magnesium taurinate, magnesium citrate, magnesium alpha-ketoglutarate)?  See how you feel with this and if you do not notice a difference try magnesium glycinate 1000 mg.  ? Testing for MTHR gene mutation.  Discuss with your doctor.   (Patient was adopted - Limited info includes great aunt was mother who was a drug addict, bipolar and had early dementia at age 39).    Your nutrient intake including protein, fiber, and other nutrients is not sufficient.  -Breakfast:  [place] yogurt with fruit OR overnight oats with 2% milk and fruit (mix in 2 Tablespoons of chia seeds or ground)  -Lunch:  leftovers OR Tuna on fresh spinach, fresh fruit  -Balanced dinner with 4 oz fish or chicken or other meat, 1 starch such as beans or sweet potato, vegetables Add variety in your diet when you are able.  Be sure you are getting foods that have vitamin A daily (carrots, sweet potato, tomatoes, greens).  Continue to stay active and walk or use your exercise equipment daily. Sleep - sleep schedule?  Work on seeing daylight each morning.  Self care

## 2022-04-12 NOTE — Progress Notes (Signed)
Medical Nutrition Therapy  Appointment Start time:  1400  Appointment End time:  3545  Primary concerns today: Patient would like to improve her blood glucose.  States that she only eats once per day "if she is lucky" and sometimes will skip 2 days and not think about it until she starts to feel shaky.  Skips meals due to poor appetite on Adderal, ADD (distraction and forgets).  She would like to lose weight and is concerned that if she starts to eat more regularly or force herself to eat, she will then gain weight gain.  MRI of brain 04/13/2022 to determine cause of vision changes in left eye. Referral diagnosis: Weight gain Preferred learning style: no preference indicated Learning readiness:  ready   NUTRITION ASSESSMENT   Anthropometrics  64.5" 197 lbs 04/12/2022 140 lbs lowest adult weight in 2006 Started gaining weight since that time due to unknown cause although around the same time as prediabetes was diagnosed. 199 lbs highest adult weight UBW recently 196-199 lbs  Brief nutrition exam completed.  Easily pluckable hair with increased hair loss, nail have vertical ridging which can be normal and otherwise nothing noted.  White spots on white of eyes.   Clinical Medical Hx: Prediabetes, history of GDM,  vitamin B-12 deficiency, obesity, ADHD, chronic constipation Medications: Metformin, folic acid, vitamin D, magnesium, potassium, vitamin B-12 injections (monthly), Trulance (for constipation), adderall Labs: Vitamin B-12 392 on 12/14/2021 increased from 188 on 10/30/2021 Folate 5.9 (low) 10/30/2021, A1C 6.1% 03/17/2022, Cholesterol 181, HDL 47, LDL 112, Triglycerides 109 on 12/14/2021 Notable Signs/Symptoms: BM every 1-2 days on Trulance and previous to that once per month, increased amounts of hair loss for the last 1 1/2 years.  Lifestyle & Dietary Hx Patient lives with her boyfriend and 4 children ages 44-20.  She does the shopping and cooking.  46 yo with autism - high  functioning Patient works full time as an Land for a Radio producer.  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  Estimated daily fluid intake: 175-205 oz daily from 4-5 (32 oz bottles of water, 12 oz of electrolyte drink and 2 cups coffee)  Chronic thirst, history of slightly low sodium Sleep: 1-5 hours per night due to disruptions from boyfriend who is in pain and other problems sleeping.  She does not have problems staying awake. Stress / self-care: very high stress, decreased self care Current average weekly physical activity: bone spurs at times don't allow this but has a treadmill, ecliptically, stationary bike at home, walks a dog daily for 60 minutes  24-Hr Dietary Recall 1 meal daily and occasional forgets to eat First Meal: skips Snack: none Second Meal: skips Snack: none Third Meal 6-8 pm: steak or chicken or pork or seafood (air fried), 1-2 carb choice (bread, mac and cheese, or beans), 2 vegetables Snack: occasional chips and homemade salsa Beverages: water, electrolyte drink, coffee with vanilla creamer (sugar free)  Estimated Energy Needs Calories: 1400-1500 Protein: 70-80 g  NUTRITION DIAGNOSIS  NI-1.4 Inadequate energy intake As related to decreased appetite, stimulant use, and ADD.  As evidenced by patient report and diet hx.   NUTRITION INTERVENTION  Nutrition education (E-1) on the following topics:  Self care to include improved sleep, adequate nutrition, stress control Supplement use and options that may be better absorbed Bowel habits and gut health, fiber rich foods Balanced meals and what makes up a meal Insulin resistance Sources of carbohydrate Sources of saturated fat  to decrease  Handouts Provided Include  How to Thrive:  A Guide for Your Journey with Diabetes by the ADA Meal Plan Card  Learning Style & Readiness for Change Teaching method utilized: Visual & Auditory  Demonstrated  degree of understanding via: Teach Back  Barriers to learning/adherence to lifestyle change: poor appetite  Plan: Try cyanocobalamin  vitamin B-12 orally  (one that is dissolvable or sublingual). Some do well with a Methly folate that frequently has vitamin B-12 in it as well. Try taking the magnesium at night to see if this helps you sleep better.    Country Life Magnesium caps 3 (300 mg tablets) and contains the following: (as magnesium oxide, magnesium aspartate, magnesium taurinate, magnesium citrate, magnesium alpha-ketoglutarate)?  See how you feel with this and if you do not notice a difference try magnesium glycinate 1000 mg.  ? Testing for MTHR gene mutation.  Discuss with your doctor.   (Patient was adopted - Limited info includes great aunt was mother who was a drug addict, bipolar and had early dementia at age 57).    Your nutrient intake including protein, fiber, and other nutrients is not sufficient.  -Breakfast:  [place] yogurt with fruit OR overnight oats with 2% milk and fruit (mix in 2 Tablespoons of chia seeds or ground)  -Lunch:  leftovers OR Tuna on fresh spinach, fresh fruit  -Balanced dinner with 4 oz fish or chicken or other meat, 1 starch such as beans or sweet potato, vegetables Add variety in your diet when you are able.  Be sure you are getting foods that have vitamin A daily (carrots, sweet potato, tomatoes, greens).  Continue to stay active and walk or use your exercise equipment daily. Sleep - sleep schedule?  Work on seeing daylight each morning.  Self care   MONITORING & EVALUATION Dietary intake, weekly physical activity in 3 months.  Next Steps  Patient is to call for needs.

## 2022-04-13 ENCOUNTER — Ambulatory Visit (HOSPITAL_BASED_OUTPATIENT_CLINIC_OR_DEPARTMENT_OTHER)
Admission: RE | Admit: 2022-04-13 | Discharge: 2022-04-13 | Disposition: A | Discharge status: Home / Self Care | Payer: BC Managed Care – PPO | Source: Ambulatory Visit | Attending: Family | Admitting: Family

## 2022-04-13 DIAGNOSIS — H547 Unspecified visual loss: Secondary | ICD-10-CM | POA: Diagnosis not present

## 2022-04-13 DIAGNOSIS — H532 Diplopia: Secondary | ICD-10-CM | POA: Diagnosis not present

## 2022-04-16 DIAGNOSIS — N941 Unspecified dyspareunia: Secondary | ICD-10-CM | POA: Diagnosis not present

## 2022-04-16 DIAGNOSIS — R102 Pelvic and perineal pain: Secondary | ICD-10-CM | POA: Diagnosis not present

## 2022-04-18 NOTE — Progress Notes (Deleted)
NEUROLOGY CONSULTATION NOTE  Barbara Barr MRN: [id number] DOB: Jan 20, 1976  Referring provider: [name], NP Primary care provider: [name], NP  Reason for consult:  vision changes  Assessment/Plan:   ***   Subjective:  Barbara Barr is a 46 year old female with ADHD, seasonal allergies, B12 deficiency, anxiety and headaches who presents for vision changes.  History supplemented by optometry and referring provider's notes.  Symptoms started ***.  She reports recurrent episodes where she has vision loss in her left eye sometimes accompanied by seeing sparkles and flashes of light.  This lasts about ***.  She also reports that her eyes sometimes cross, causing double vision.  Blood sugars are stable when this occurs..  She saw her optometrist in May and was found to have increased vision loss in the left eye.  She followed up with ophthalmology. .  Other than Dry Eye Syndrome, exam unremarkable.  She had MRI of brain without contrast on 04/13/2022 which was personally reviewed and was normal.   Current NSAIDS/analgesics:  ibuprofen Current triptans:  sumatriptan 198m Current ergotamine:  none Current anti-emetic:  none Current muscle relaxants:  none Current Antihypertensive medications:  none Current Antidepressant medications:  none Current Anticonvulsant medications:  none Current anti-CGRP:  none Current Vitamins/Herbal/Supplements:  D3, BW80 folic acid Current Antihistamines/Decongestants:  Flonase Other therapy:  *** Hormone/birth control:  none Other medications:  Adderall  Past NSAIDS/analgesics:  indomethacin, meloxicam, tramadol Past abortive triptans:  none Past abortive ergotamine:  none Past muscle relaxants:  Flexeril Past anti-emetic:  none Past antihypertensive medications:  none Past antidepressant medications:  Wellbutrin Past anticonvulsant medications:  *** Past anti-CGRP:  none Other past therapies:  ***  Caffeine:   *** Alcohol:  *** Smoker:  *** Diet:  *** Exercise:  *** Depression:  ***; Anxiety:  *** Other pain:  *** Sleep hygiene:  *** Family history of headache:  ***   PAST MEDICAL HISTORY: Past Medical History:  Diagnosis Date   ADHD (attention deficit hyperactivity disorder)    Allergic rhinitis, seasonal    Anemia    Anxiety    B12 deficiency 10/31/2021   Cervical dysplasia    high grade   Environmental allergies    Headache    History of gestational diabetes    Obsessive-compulsive disorder    PONV (postoperative nausea and vomiting)    Pre-diabetes    Wears glasses     PAST SURGICAL HISTORY: Past Surgical History:  Procedure Laterality Date   COLPOSCOPY N/A 05/12/2018   Procedure: COLPOSCOPY;  Surgeon: ANunzio Cobbs MD;  [facility]  Service: Gynecology;  Laterality: N/A;   LAPAROSCOPIC CHOLECYSTECTOMY  2003   LEEP N/A 05/12/2018   Procedure: LOOP ELECTROSURGICAL EXCISION PROCEDURE (LEEP) with ECC;  Surgeon: ANunzio Cobbs MD;  [facility]  Service: Gynecology;  Laterality: N/A;  with ECC   LEEP     2021   LEEP  2006   RHINOPLASTY  02/2018   TUBAL LIGATION Bilateral 2006    MEDICATIONS: Current Outpatient Medications on File Prior to Visit  Medication Sig Dispense Refill   amphetamine-dextroamphetamine (ADDERALL XR) 25 MG 24 hr capsule Take 1 capsule by mouth every morning. 30 capsule 0   blood glucose meter kit and supplies KIT Dispense based on patient and insurance preference. Use up to four times daily as directed. 1 each 0   Cholecalciferol (VITAMIN D3) 75 MCG (3000 UT) TABS Take 1 tablet  by mouth daily. 30 tablet    fluticasone (FLONASE) 50 MCG/ACT nasal spray Place 1 spray into both nostrils 2 (two) times daily. 16 g 3   folic acid (FOLVITE) 1 MG tablet Take 1 tablet (1 mg total) by mouth daily.     ibuprofen (ADVIL) 800 MG tablet Take 1 tablet (800 mg total) by mouth every 8 (eight)  hours as needed. 30 tablet 0   Magnesium Stearate POWD 1,000 mg by Does not apply route daily.     metFORMIN (GLUCOPHAGE-XR) 500 MG 24 hr tablet      ONETOUCH ULTRA test strip USE UP TO FOUR TIMES DAILY AS DIRECTED. 100 strip 2   Plecanatide (TRULANCE) 3 MG TABS Take 1 tablet by mouth daily. 30 tablet 2   SUMAtriptan (IMITREX) 100 MG tablet Take by mouth.     No current facility-administered medications on file prior to visit.    ALLERGIES: Allergies  Allergen Reactions   Lexapro [Escitalopram] Hives   Penicillins Hives   Benadryl [Diphenhydramine] Rash   Codeine Itching    FAMILY HISTORY: Family History  Problem Relation Age of Onset   Drug abuse Mother        "was a prostitute"   Bipolar disorder Mother    Dementia Maternal Aunt    Cancer Maternal Aunt    Cervical cancer Maternal Aunt    Cancer Maternal Aunt    ADD / ADHD Maternal Uncle    Stroke Maternal Uncle    Heart attack Maternal Uncle     Objective:  *** General: No acute distress.  Patient appears well-groomed.   Head:  Normocephalic/atraumatic Eyes:  fundi examined but not visualized Neck: supple, no paraspinal tenderness, full range of motion Back: No paraspinal tenderness Heart: regular rate and rhythm Lungs: Clear to auscultation bilaterally. Vascular: No carotid bruits. Neurological Exam: Mental status: alert and oriented to person, place, and time, recent and remote memory intact, fund of knowledge intact, attention and concentration intact, speech fluent and not dysarthric, language intact. Cranial nerves: CN I: not tested CN II: pupils equal, round and reactive to light, visual fields intact CN III, IV, VI:  full range of motion, no nystagmus, no ptosis CN V: facial sensation intact. CN VII: upper and lower face symmetric CN VIII: hearing intact CN IX, X: gag intact, uvula midline CN XI: sternocleidomastoid and trapezius muscles intact CN XII: tongue midline Bulk & Tone: normal, no  fasciculations. Motor:  muscle strength 5/5 throughout Sensation:  Pinprick, temperature and vibratory sensation intact. Deep Tendon Reflexes:  2+ throughout,  toes downgoing.   Finger to nose testing:  Without dysmetria.   Heel to shin:  Without dysmetria.   Gait:  Normal station and stride.  Romberg negative.    Thank you for allowing me to take part in the care of this patient.  Metta Clines, DO  CC: ***

## 2022-04-19 ENCOUNTER — Ambulatory Visit: Payer: BC Managed Care – PPO | Admitting: Neurology

## 2022-04-26 ENCOUNTER — Other Ambulatory Visit: Payer: Self-pay | Admitting: Family

## 2022-04-26 ENCOUNTER — Other Ambulatory Visit (HOSPITAL_BASED_OUTPATIENT_CLINIC_OR_DEPARTMENT_OTHER): Payer: Self-pay

## 2022-04-26 ENCOUNTER — Encounter (INDEPENDENT_AMBULATORY_CARE_PROVIDER_SITE_OTHER): Payer: BC Managed Care – PPO | Admitting: Family

## 2022-04-26 ENCOUNTER — Other Ambulatory Visit: Payer: Self-pay | Admitting: Obstetrics and Gynecology

## 2022-04-26 ENCOUNTER — Encounter: Payer: Self-pay | Admitting: Family

## 2022-04-26 DIAGNOSIS — E538 Deficiency of other specified B group vitamins: Secondary | ICD-10-CM

## 2022-04-26 DIAGNOSIS — K5900 Constipation, unspecified: Secondary | ICD-10-CM | POA: Diagnosis not present

## 2022-04-26 MED ORDER — SUMATRIPTAN SUCCINATE 100 MG PO TABS
100.0000 mg | ORAL_TABLET | ORAL | 5 refills | Status: DC | PRN
  Filled 2022-05-03: qty 10, 18d supply, fill #0

## 2022-04-26 MED ORDER — AMPHETAMINE-DEXTROAMPHET ER 25 MG PO CP24
25.0000 mg | ORAL_CAPSULE | ORAL | 0 refills | Status: DC
  Filled 2022-04-26: qty 30, 30d supply, fill #0

## 2022-04-26 MED ORDER — IBUPROFEN 800 MG PO TABS: 800.0000 mg | ORAL_TABLET | Freq: Three times a day (TID) | ORAL | 0 refills | Status: DC | PRN

## 2022-04-26 NOTE — Telephone Encounter (Signed)

## 2022-04-26 NOTE — Telephone Encounter (Signed)
I do not see where you have ever refilled/prescribed this for Pt. Please advise.

## 2022-04-26 NOTE — Telephone Encounter (Signed)
Patient last seen 3 year ago.

## 2022-05-03 ENCOUNTER — Other Ambulatory Visit: Payer: BC Managed Care – PPO

## 2022-05-03 ENCOUNTER — Other Ambulatory Visit (HOSPITAL_BASED_OUTPATIENT_CLINIC_OR_DEPARTMENT_OTHER): Payer: Self-pay

## 2022-05-03 ENCOUNTER — Ambulatory Visit (INDEPENDENT_AMBULATORY_CARE_PROVIDER_SITE_OTHER): Payer: BC Managed Care – PPO | Admitting: *Deleted

## 2022-05-03 DIAGNOSIS — E538 Deficiency of other specified B group vitamins: Secondary | ICD-10-CM

## 2022-05-03 MED ORDER — AMPHETAMINE-DEXTROAMPHET ER 25 MG PO CP24: 25.0000 mg | ORAL_CAPSULE | ORAL | 0 refills | Status: DC

## 2022-05-03 MED ORDER — CYANOCOBALAMIN 1000 MCG/ML IJ SOLN
1000.0000 ug | Freq: Once | INTRAMUSCULAR | Status: AC
  Administered 2022-05-03: 1000 ug via INTRAMUSCULAR

## 2022-05-03 MED ORDER — SUMATRIPTAN SUCCINATE 100 MG PO TABS
100.0000 mg | ORAL_TABLET | ORAL | 5 refills | Status: DC | PRN
Start: 2022-05-03 — End: 2022-12-27

## 2022-05-03 NOTE — Addendum Note (Signed)
Addended by: [name] on: 05/03/2022 05:00 PM   Modules accepted: Orders

## 2022-05-03 NOTE — Progress Notes (Deleted)
Barbara Barr is a 46 y.o. female presents to the office today for ***:*** *** injections, per physician's orders. Original order: Original order: [name] " I would recommend that she begin b12 injections 1000 mcg IM weekly for 4 weeks, then monthly." B12 given IM, *** deltoid. Patient tolerated injection. Patient due for follow up labs/provider appt: No. Patient next injection due: 1 month, appt made {yes/no:[id number]}  Creft, Melton Alar L

## 2022-05-03 NOTE — Progress Notes (Signed)
Patient here for montly b12 injection per PCP orders.    Injection given in left deltoid and patient tolerated well.  Next injection scheduled for 06/07/22.

## 2022-05-10 ENCOUNTER — Other Ambulatory Visit: Payer: BC Managed Care – PPO

## 2022-05-16 DIAGNOSIS — N766 Ulceration of vulva: Secondary | ICD-10-CM | POA: Diagnosis not present

## 2022-05-16 DIAGNOSIS — N941 Unspecified dyspareunia: Secondary | ICD-10-CM | POA: Diagnosis not present

## 2022-05-16 DIAGNOSIS — R102 Pelvic and perineal pain: Secondary | ICD-10-CM | POA: Diagnosis not present

## 2022-05-22 NOTE — Progress Notes (Deleted)
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    PCP:   [name], NP  No LMP recorded. (Menstrual status: Other).           Sexually active: {yes no:314532}  The current method of family planning is tubal ligation.    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 Her smoking use included cigarettes. She started smoking about 15 months ago. She has a 12.50 pack-year smoking history. She has never used smokeless tobacco. She reports that she does not drink alcohol and does not use drugs.  Past Medical History:  Diagnosis Date   ADHD (attention deficit hyperactivity disorder)    Allergic rhinitis, seasonal    Anemia    Anxiety    B12 deficiency 10/31/2021   Cervical dysplasia    high grade   Environmental allergies    Headache    History of gestational diabetes    Obsessive-compulsive disorder    PONV (postoperative nausea and vomiting)    Pre-diabetes    Wears glasses     Past Surgical History:  Procedure Laterality Date   COLPOSCOPY N/A 05/12/2018   Procedure: COLPOSCOPY;  Surgeon: Nunzio Cobbs, MD;  Location: [facility];  Service: Gynecology;  Laterality: N/A;   LAPAROSCOPIC CHOLECYSTECTOMY  2003   LEEP N/A 05/12/2018   Procedure: LOOP ELECTROSURGICAL EXCISION PROCEDURE (LEEP) with ECC;  Surgeon: Nunzio Cobbs, MD;  Location: [facility];   Service: Gynecology;  Laterality: N/A;  with ECC   LEEP     2021   LEEP  2006   RHINOPLASTY  02/2018   TUBAL LIGATION Bilateral 2006    Current Outpatient Medications  Medication Sig Dispense Refill   amphetamine-dextroamphetamine (ADDERALL XR) 25 MG 24 hr capsule Take 1 capsule by mouth every morning. 30 capsule 0   blood glucose meter kit and supplies KIT Dispense based on patient and insurance preference. Use up to four times daily as directed. 1 each 0   Cholecalciferol (VITAMIN D3) 75 MCG (3000 UT) TABS Take 1 tablet by mouth daily. 30 tablet    fluticasone (FLONASE) 50 MCG/ACT nasal spray Place 1 spray into both nostrils 2 (two) times daily. 16 g 3   folic acid (FOLVITE) 1 MG tablet Take 1 tablet (1 mg total) by mouth daily.     ibuprofen (ADVIL) 800 MG tablet Take 1 tablet (800 mg total) by mouth every 8 (eight) hours as needed. 30 tablet 0   Magnesium Stearate POWD 1,000 mg by Does not apply route daily.     metFORMIN (GLUCOPHAGE-XR) 500 MG 24 hr tablet      ONETOUCH ULTRA test strip USE UP TO FOUR TIMES DAILY AS DIRECTED. 100 strip 2   Plecanatide (TRULANCE) 3 MG TABS Take 1 tablet by mouth daily. 30 tablet 2   SUMAtriptan (IMITREX) 100 MG tablet Take 1 tablet (100 mg total) by  mouth every 2 (two) hours as needed for migraine (max 2 doses/24 hr). 10 tablet 5   No current facility-administered medications for this visit.    Family History  Problem Relation Age of Onset   Drug abuse Mother        "was a prostitute"   Bipolar disorder Mother    Dementia Maternal Aunt    Cancer Maternal Aunt    Cervical cancer Maternal Aunt    Cancer Maternal Aunt    ADD / ADHD Maternal Uncle    Stroke Maternal Uncle    Heart attack Maternal Uncle     Review of Systems  Exam:   There were no vitals taken for this visit.    General appearance: alert, cooperative and appears stated age Head: normocephalic, without obvious abnormality, atraumatic Neck: no adenopathy, supple,  symmetrical, trachea midline and thyroid normal to inspection and palpation Lungs: clear to auscultation bilaterally Breasts: normal appearance, no masses or tenderness, No nipple retraction or dimpling, No nipple discharge or bleeding, No axillary adenopathy Heart: regular rate and rhythm Abdomen: soft, non-tender; no masses, no organomegaly Extremities: extremities normal, atraumatic, no cyanosis or edema Skin: skin color, texture, turgor normal. No rashes or lesions Lymph nodes: cervical, supraclavicular, and axillary nodes normal. Neurologic: grossly normal  Pelvic: External genitalia:  no lesions              No abnormal inguinal nodes palpated.              Urethra:  normal appearing urethra with no masses, tenderness or lesions              Bartholins and Skenes: normal                 Vagina: normal appearing vagina with normal color and discharge, no lesions              Cervix: no lesions              Pap taken: {yes no:314532} Bimanual Exam:  Uterus:  normal size, contour, position, consistency, mobility, non-tender              Adnexa: no mass, fullness, tenderness              Rectal exam: {yes no:314532}.  Confirms.              Anus:  normal sphincter tone, no lesions  Chaperone was present for exam:  ***  Assessment:   Well woman visit with gynecologic exam.   Plan: Mammogram screening discussed. Self breast awareness reviewed. Pap and HR HPV as above. Guidelines for Calcium, Vitamin D, regular exercise program including cardiovascular and weight bearing exercise.   Follow up annually and prn.   Additional counseling given.  {yes [id number]. _______ minutes face to face time of which over 50% was spent in counseling.    After visit summary provided.

## 2022-05-23 ENCOUNTER — Encounter: Payer: BC Managed Care – PPO | Admitting: Obstetrics and Gynecology

## 2022-05-23 DIAGNOSIS — Z880 Allergy status to penicillin: Secondary | ICD-10-CM | POA: Diagnosis not present

## 2022-05-23 DIAGNOSIS — N766 Ulceration of vulva: Secondary | ICD-10-CM | POA: Diagnosis not present

## 2022-05-23 DIAGNOSIS — Z8616 Personal history of COVID-19: Secondary | ICD-10-CM | POA: Diagnosis not present

## 2022-05-23 DIAGNOSIS — Z886 Allergy status to analgesic agent status: Secondary | ICD-10-CM | POA: Diagnosis not present

## 2022-05-23 DIAGNOSIS — K219 Gastro-esophageal reflux disease without esophagitis: Secondary | ICD-10-CM | POA: Diagnosis not present

## 2022-05-23 DIAGNOSIS — D509 Iron deficiency anemia, unspecified: Secondary | ICD-10-CM | POA: Diagnosis not present

## 2022-05-23 DIAGNOSIS — N941 Unspecified dyspareunia: Secondary | ICD-10-CM | POA: Diagnosis not present

## 2022-05-23 DIAGNOSIS — Z87891 Personal history of nicotine dependence: Secondary | ICD-10-CM | POA: Diagnosis not present

## 2022-05-23 DIAGNOSIS — N736 Female pelvic peritoneal adhesions (postinfective): Secondary | ICD-10-CM | POA: Diagnosis not present

## 2022-05-23 DIAGNOSIS — Z79899 Other long term (current) drug therapy: Secondary | ICD-10-CM | POA: Diagnosis not present

## 2022-05-23 DIAGNOSIS — R102 Pelvic and perineal pain: Secondary | ICD-10-CM | POA: Diagnosis not present

## 2022-05-23 DIAGNOSIS — F902 Attention-deficit hyperactivity disorder, combined type: Secondary | ICD-10-CM | POA: Diagnosis not present

## 2022-05-23 DIAGNOSIS — F32A Depression, unspecified: Secondary | ICD-10-CM | POA: Diagnosis not present

## 2022-05-30 ENCOUNTER — Ambulatory Visit: Payer: BC Managed Care – PPO | Admitting: Neurology

## 2022-06-05 DIAGNOSIS — Z9889 Other specified postprocedural states: Secondary | ICD-10-CM | POA: Diagnosis not present

## 2022-06-05 DIAGNOSIS — Z803 Family history of malignant neoplasm of breast: Secondary | ICD-10-CM | POA: Diagnosis not present

## 2022-06-05 DIAGNOSIS — N6313 Unspecified lump in the right breast, lower outer quadrant: Secondary | ICD-10-CM | POA: Diagnosis not present

## 2022-06-05 DIAGNOSIS — R102 Pelvic and perineal pain: Secondary | ICD-10-CM | POA: Diagnosis not present

## 2022-06-07 ENCOUNTER — Other Ambulatory Visit: Payer: BC Managed Care – PPO

## 2022-06-07 ENCOUNTER — Ambulatory Visit (INDEPENDENT_AMBULATORY_CARE_PROVIDER_SITE_OTHER): Payer: BC Managed Care – PPO

## 2022-06-07 DIAGNOSIS — E538 Deficiency of other specified B group vitamins: Secondary | ICD-10-CM | POA: Diagnosis not present

## 2022-06-07 MED ORDER — CYANOCOBALAMIN 1000 MCG/ML IJ SOLN
1000.0000 ug | Freq: Once | INTRAMUSCULAR | Status: AC
  Administered 2022-06-07: 1000 ug via INTRAMUSCULAR

## 2022-06-07 NOTE — Progress Notes (Signed)
Patient here for montly b12 injection per PCP orders.     Injection given in right deltoid and patient tolerated well.   Next injection scheduled for 07/05/22.

## 2022-06-08 ENCOUNTER — Encounter: Payer: Self-pay | Admitting: Family

## 2022-06-13 LAB — MTHFR DNA ANALYSIS: Methylenetetrahydrofolate Reductase (MTHFR),DNA: POSITIVE

## 2022-06-28 ENCOUNTER — Encounter: Payer: BC Managed Care – PPO | Attending: Family | Admitting: Dietician

## 2022-06-28 ENCOUNTER — Other Ambulatory Visit (HOSPITAL_BASED_OUTPATIENT_CLINIC_OR_DEPARTMENT_OTHER): Payer: Self-pay

## 2022-06-28 ENCOUNTER — Encounter: Payer: Self-pay | Admitting: Dietician

## 2022-06-28 ENCOUNTER — Other Ambulatory Visit: Payer: Self-pay | Admitting: Family

## 2022-06-28 DIAGNOSIS — Z6836 Body mass index (BMI) 36.0-36.9, adult: Secondary | ICD-10-CM | POA: Insufficient documentation

## 2022-06-28 DIAGNOSIS — R7303 Prediabetes: Secondary | ICD-10-CM | POA: Insufficient documentation

## 2022-06-28 DIAGNOSIS — E669 Obesity, unspecified: Secondary | ICD-10-CM | POA: Insufficient documentation

## 2022-06-28 DIAGNOSIS — E538 Deficiency of other specified B group vitamins: Secondary | ICD-10-CM | POA: Insufficient documentation

## 2022-06-28 MED ORDER — AMPHETAMINE-DEXTROAMPHET ER 25 MG PO CP24
25.0000 mg | ORAL_CAPSULE | ORAL | 0 refills | Status: DC
Start: 2022-06-28 — End: 2022-10-11
  Filled 2022-07-05: qty 30, 30d supply, fill #0

## 2022-06-28 NOTE — Telephone Encounter (Signed)
Requesting:adderall 25 mg  Contract:10/30/21 UDS:10/30/21 Last Visit:04/05/22 Next Visit:07/19/22 Last Refill:05/03/22  Please Advise

## 2022-06-28 NOTE — Patient Instructions (Addendum)
Neuro Bilogix Methylation Complete (Bioactive Methyl B-12, Folate)  Great job on the changes that you have made to improve your eating habits! Consider adding soybeans, edemame, tofu to your diet daily.  Marinated tofu is great on a salad.  Continue to eat adequate fiber in your diet.  Legumes (beans, lentils)  Consider Quinoa  Plenty of vegetables  Fresh fruit (pears, berries in particular)

## 2022-06-28 NOTE — Progress Notes (Signed)
Medical Nutrition Therapy  Appointment Start time:  1310  Appointment End time:  1515  Since her last appointment, she has gotten results from the MTHFR genetics test which were positive. She has reduced dairy, gluten, aiming to increase her fiber intake.  Has added chia seeds and ground flax seeds. Now eating 3-4 times daily rather than once daily and protein intake is now adequate and fiber intake has improved. Weight is stable. Sleep has improved but still not adequate due to increased night sweats (she states she will discuss with her doctor Bowel movement have improved but still only once every 2-3 days. 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 Noted that endometriosis has improved in the last 5-6 months. She continues to experience hair loss.  White spots on eye persist.  She states that she will follow up with the neurologist as well.  Primary concerns today: Patient would like to improve her blood glucose.  States that she only eats once per day "if she is lucky" and sometimes will skip 2 days and not think about it until she starts to feel shaky.  Skips meals due to poor appetite on Adderal, ADD (distraction and forgets).  She would like to lose weight and is concerned that if she starts to eat more regularly or force herself to eat, she will then gain weight gain.  MRI of brain 04/13/2022 to determine cause of vision changes in left eye. Referral diagnosis: Weight gain Preferred learning style: no preference indicated Learning readiness:  ready   NUTRITION ASSESSMENT   Anthropometrics  64.5" 197 lbs 04/12/2022 140 lbs lowest adult weight in 2006 Started gaining weight since that time due to unknown cause although around the same time as prediabetes was diagnosed. 199 lbs highest adult weight UBW recently 196-199 lbs  Brief nutrition exam completed.  Easily pluckable hair with increased hair loss, nail have vertical  ridging which can be normal and otherwise nothing noted.  White spots on white of eyes.   Clinical Medical Hx: Prediabetes, history of GDM,  vitamin B-12 deficiency, obesity, ADHD, chronic constipation Medications: Metformin, folic acid, vitamin D, magnesium, potassium, vitamin B-12 injections (monthly), Trulance (for constipation), adderall Labs: Vitamin B-12 392 on 12/14/2021 increased from 188 on 10/30/2021 Folate 5.9 (low) 10/30/2021, A1C 6.1% 03/17/2022, Cholesterol 181, HDL 47, LDL 112, Triglycerides 109 on 12/14/2021 Notable Signs/Symptoms: BM every 1-2 days on Trulance and previous to that once per month, increased amounts of hair loss for the last 1 1/2 years.  Lifestyle & Dietary Hx Patient lives with her boyfriend and 4 children ages 13-20.  She does the shopping and cooking.  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  Estimated daily fluid intake: 175-205 oz daily from 4-5 (32 oz bottles of water, 12 oz of electrolyte drink and 2 cups coffee)  Chronic thirst, history of slightly low sodium Sleep: 3-3.5 hours per night due to night sweats.                                           She does not have problems staying awake. Stress / self-care: very high stress, decreased self care Current average weekly physical activity: bone spurs at times  don't allow this but has a treadmill, ecliptically, stationary bike at home, walks a dog daily for 60 minutes  24-Hr Dietary Recall 1 meal daily and occasional forgets to eat First Meal: shot of espresso and protein shake Snack: none Second Meal: salad with bacon, tuna, chicken, cottage cheese, or egg Snack: none Third Meal 6-8 pm: leftovers OR chicken or other meat, vegetables, beans Snack: cottage cheese with sugar free cheesecake pudding mix and occasional fruit OR salsa with  chips Beverages: water, electrolyte drink, Expresso shot with protein shake  Estimated Energy Needs Calories: 1400-1500 Protein: 70-80 g  NUTRITION DIAGNOSIS  NI-1.4 Inadequate energy intake As related to decreased appetite, stimulant use, and ADD.  As evidenced by patient report and diet hx.   NUTRITION INTERVENTION  Nutrition education (E-1) on the following topics:  Self care to include improved sleep Supplement use and options that may be better absorbed Review of bowel habits and fiber Nutrition to help hot flashes  Handouts Provided Include (initial visit) How to Thrive:  A Guide for Your Journey with Diabetes by the ADA Meal Plan Card  Learning Style & Readiness for Change Teaching method utilized: Visual & Auditory  Demonstrated degree of understanding via: Teach Back  Barriers to learning/adherence to lifestyle change: poor appetite  Plan: Neuro Bilogix Methylation Complete (Bioactive Methyl B-12, Folate)  Great job on the changes that you have made to improve your eating habits! Consider adding soybeans, edemame, tofu to your diet daily.  Marinated tofu is great on a salad.  Continue to eat adequate fiber in your diet.  Legumes (beans, lentils)  Consider Quinoa  Plenty of vegetables  Fresh fruit (pears, berries in particular)   MONITORING & EVALUATION Dietary intake, weekly physical activity in 3 months.  Next Steps  Patient is to call for needs.

## 2022-07-03 DIAGNOSIS — F32A Depression, unspecified: Secondary | ICD-10-CM | POA: Diagnosis not present

## 2022-07-05 ENCOUNTER — Other Ambulatory Visit (HOSPITAL_BASED_OUTPATIENT_CLINIC_OR_DEPARTMENT_OTHER): Payer: Self-pay

## 2022-07-05 ENCOUNTER — Ambulatory Visit (INDEPENDENT_AMBULATORY_CARE_PROVIDER_SITE_OTHER): Payer: BC Managed Care – PPO

## 2022-07-05 DIAGNOSIS — E538 Deficiency of other specified B group vitamins: Secondary | ICD-10-CM | POA: Diagnosis not present

## 2022-07-05 MED ORDER — CYANOCOBALAMIN 1000 MCG/ML IJ SOLN
1000.0000 ug | Freq: Once | INTRAMUSCULAR | Status: AC
  Administered 2022-07-05: 1000 ug via INTRAMUSCULAR

## 2022-07-05 NOTE — Progress Notes (Signed)
Pt here for monthly B12 injection per [name], NP  B12 given IM, left deltoid and pt tolerated injection well.  Next B12 injection scheduled for next month

## 2022-07-19 ENCOUNTER — Ambulatory Visit (INDEPENDENT_AMBULATORY_CARE_PROVIDER_SITE_OTHER): Payer: BC Managed Care – PPO | Admitting: Family

## 2022-07-19 ENCOUNTER — Encounter: Payer: Self-pay | Admitting: Family

## 2022-07-19 ENCOUNTER — Other Ambulatory Visit (INDEPENDENT_AMBULATORY_CARE_PROVIDER_SITE_OTHER): Payer: BC Managed Care – PPO

## 2022-07-19 VITALS — BP 119/64 | HR 86 | Temp 98.1°F | Resp 16 | Ht 64.0 in | Wt 199.0 lb

## 2022-07-19 DIAGNOSIS — R09A2 Foreign body sensation, throat: Secondary | ICD-10-CM | POA: Insufficient documentation

## 2022-07-19 DIAGNOSIS — Z6836 Body mass index (BMI) 36.0-36.9, adult: Secondary | ICD-10-CM

## 2022-07-19 DIAGNOSIS — Z1589 Genetic susceptibility to other disease: Secondary | ICD-10-CM

## 2022-07-19 DIAGNOSIS — F988 Other specified behavioral and emotional disorders with onset usually occurring in childhood and adolescence: Secondary | ICD-10-CM | POA: Diagnosis not present

## 2022-07-19 DIAGNOSIS — N951 Menopausal and female climacteric states: Secondary | ICD-10-CM

## 2022-07-19 DIAGNOSIS — R0989 Other specified symptoms and signs involving the circulatory and respiratory systems: Secondary | ICD-10-CM | POA: Diagnosis not present

## 2022-07-19 DIAGNOSIS — G43109 Migraine with aura, not intractable, without status migrainosus: Secondary | ICD-10-CM | POA: Insufficient documentation

## 2022-07-19 DIAGNOSIS — R739 Hyperglycemia, unspecified: Secondary | ICD-10-CM

## 2022-07-19 DIAGNOSIS — E538 Deficiency of other specified B group vitamins: Secondary | ICD-10-CM

## 2022-07-19 DIAGNOSIS — E669 Obesity, unspecified: Secondary | ICD-10-CM

## 2022-07-19 LAB — BASIC METABOLIC PANEL
BUN: 12 mg/dL (ref 6–23)
CO2: 28 mEq/L (ref 19–32)
Calcium: 9.5 mg/dL (ref 8.4–10.5)
Chloride: 101 mEq/L (ref 96–112)
Creatinine, Ser: 0.75 mg/dL (ref 0.40–1.20)
GFR: 96.89 mL/min (ref >60.00)
Glucose, Bld: 85 mg/dL (ref 70–99)
Potassium: 3.6 mEq/L (ref 3.5–5.1)
Sodium: 139 mEq/L (ref 135–145)

## 2022-07-19 LAB — VITAMIN B12: Vitamin B-12: 383 pg/mL (ref 211–911)

## 2022-07-19 MED ORDER — OMEPRAZOLE 40 MG PO CPDR: 40.0000 mg | DELAYED_RELEASE_CAPSULE | Freq: Every day | ORAL | 1 refills | Status: DC

## 2022-07-19 NOTE — Assessment & Plan Note (Addendum)
New. Suspect secondary to gerd. Trial of omeprazole.

## 2022-07-19 NOTE — Assessment & Plan Note (Signed)
Eye exam reportedly normal, MR Brain negative.  Neuro consult pending.  Recommended ok to take imitrex as needed for vision loss even if it is not accompanied by headache.

## 2022-07-19 NOTE — Progress Notes (Signed)
Subjective:   By signing my name below, I, Carylon Perches, attest that this documentation has been prepared under the direction and in the presence of [place], NP 07/19/2022   Patient ID: Barbara Barr, female    DOB: 12-08-1975, 46 y.o.   MRN: [id number]  Chief Complaint  Patient presents with   Hyperglycemia    Here for follow up    HPI Patient is in today for an office visit  Lump in Throat: She complains of a lump in her throat that arises when she is eating, drinking, or swallowing. She denies of any pain.  Vision Loss: Her vision symptoms are worsening. She is scheduled to see a neurologist on 08/23/2022. She has a hot flash before symptoms occur with an occasional migraine. When her headaches do appear she takes 100 Mg of Imitrex.  ADD: Her symptoms are controlled. She is currently taking 25 Mg of Adderall XR.  Restless Sleep: She reports that her sleep symptoms are persistent. She would occasionally wake up in night sweats. Her cycles are irregular. She has done a sleep study in the past for snoring and results were negative  B-12 Injections: She reports that she had her last B-12 injection about two weeks ago.  Blood Sugar: She is currently taking 500 Mg of Metformin. She does not believe that the medication is decreasing her blood sugar levels. She states that her insurance does cover [facility] Lab Results  Component Value Date   HGBA1C 6.1 02/15/2022   Pap Smear: Last completed on 10/30/2018. She is scheduled to follow up with her OB-GYN Immunizations: She is not interested in receiving an influenza vaccine or HIV screening during today's visit.   Health Maintenance Due  Topic Date Due   PAP SMEAR-Modifier  10/30/2021    Past Medical History:  Diagnosis Date   ADHD (attention deficit hyperactivity disorder)    Allergic rhinitis, seasonal    Anemia    Anxiety    B12 deficiency 10/31/2021   Cervical dysplasia    high grade   Environmental allergies     Headache    History of gestational diabetes    History of tobacco abuse 04/07/2018   Obsessive-compulsive disorder    PONV (postoperative nausea and vomiting)    Pre-diabetes    Wears glasses     Past Surgical History:  Procedure Laterality Date   COLPOSCOPY N/A 05/12/2018   Procedure: COLPOSCOPY;  Surgeon: Nunzio Cobbs, MD;  Location: [facility];  Service: Gynecology;  Laterality: N/A;   LAPAROSCOPIC CHOLECYSTECTOMY  2003   LEEP N/A 05/12/2018   Procedure: LOOP ELECTROSURGICAL EXCISION PROCEDURE (LEEP) with ECC;  Surgeon: Nunzio Cobbs, MD;  Location: [facility];  Service: Gynecology;  Laterality: N/A;  with ECC   LEEP     2021   LEEP  2006   RHINOPLASTY  02/2018   TUBAL LIGATION Bilateral 2006    Family History  Problem Relation Age of Onset   Drug abuse Mother        "was a prostitute"   Bipolar disorder Mother    Dementia Maternal Aunt    Cancer Maternal Aunt    Cervical cancer Maternal Aunt    Cancer Maternal Aunt    ADD / ADHD Maternal Uncle    Stroke Maternal Uncle    Heart attack Maternal Uncle     Social History   Socioeconomic History   Marital status: Soil scientist  Spouse name: Not on file   Number of children: Not on file   Years of education: Not on file   Highest education level: Not on file  Occupational History   Not on file  Tobacco Use   Smoking status: Former    Packs/day: 0.50    Years: 25.00    Total pack years: 12.50    Types: Cigarettes    Start date: 01/26/2021   Smokeless tobacco: Never  Vaping Use   Vaping Use: Every day  Substance and Sexual Activity   Alcohol use: No   Drug use: No   Sexual activity: Yes    Birth control/protection: Surgical  Other Topics Concern   Not on file  Social History Narrative   Adopted by her great aunt on her mother's side   Has a half sister with her biological mom   Works as an Financial planner   Lives with her boyfriend    2003- son Randall Hiss   2006- Daughter [name]   Enjoys building furniture/arts and crafts/fishing   Completed bachelor's degree   2 [employer]   Social Determinants of Radio broadcast assistant Strain: Not on file  Food Insecurity: Not on file  Transportation Needs: Not on file  Physical Activity: Sufficiently Active (10/30/2021)   Exercise Vital Sign    Days of Exercise per Week: 3 days    Minutes of Exercise per Session: 60 min  Stress: Not on file  Social Connections: Not on file  Intimate Partner Violence: Not on file    Outpatient Medications Prior to Visit  Medication Sig Dispense Refill   amphetamine-dextroamphetamine (ADDERALL XR) 25 MG 24 hr capsule Take 1 capsule by mouth every morning. 30 capsule 0   blood glucose meter kit and supplies KIT Dispense based on patient and insurance preference. Use up to four times daily as directed. 1 each 0   Cholecalciferol (VITAMIN D3) 75 MCG (3000 UT) TABS Take 1 tablet by mouth daily. 30 tablet    fluticasone (FLONASE) 50 MCG/ACT nasal spray Place 1 spray into both nostrils 2 (two) times daily. 16 g 3   folic acid (FOLVITE) 1 MG tablet Take 1 tablet (1 mg total) by mouth daily.     ibuprofen (ADVIL) 800 MG tablet Take 1 tablet (800 mg total) by mouth every 8 (eight) hours as needed. 30 tablet 0   Magnesium Stearate POWD 1,000 mg by Does not apply route daily.     metFORMIN (GLUCOPHAGE-XR) 500 MG 24 hr tablet      ONETOUCH ULTRA test strip USE UP TO FOUR TIMES DAILY AS DIRECTED. 100 strip 2   Plecanatide (TRULANCE) 3 MG TABS Take 1 tablet by mouth daily. 30 tablet 2   SUMAtriptan (IMITREX) 100 MG tablet Take 1 tablet (100 mg total) by mouth every 2 (two) hours as needed for migraine (max 2 doses/24 hr). 10 tablet 5   No facility-administered medications prior to visit.    Allergies  Allergen Reactions   Lexapro [Escitalopram] Hives   Penicillins Hives   Benadryl [Diphenhydramine] Rash   Codeine Itching    Review of Systems   HENT:         (+) Lump in Throat       Objective:    Physical Exam Constitutional:      General: She is not in acute distress.    Appearance: Normal appearance. She is not ill-appearing.  HENT:     Head: Normocephalic and atraumatic.     Right  Ear: External ear normal.     Left Ear: External ear normal.     Mouth/Throat:     Pharynx: No oropharyngeal exudate.  Eyes:     Extraocular Movements: Extraocular movements intact.     Pupils: Pupils are equal, round, and reactive to light.  Neck:     Thyroid: No thyromegaly.  Cardiovascular:     Rate and Rhythm: Normal rate and regular rhythm.     Heart sounds: Normal heart sounds. No murmur heard.    No gallop.  Pulmonary:     Effort: Pulmonary effort is normal. No respiratory distress.     Breath sounds: Normal breath sounds. No wheezing or rales.  Lymphadenopathy:     Cervical: No cervical adenopathy.  Skin:    General: Skin is warm and dry.  Neurological:     Mental Status: She is alert and oriented to person, place, and time.  Psychiatric:        Mood and Affect: Mood normal.        Behavior: Behavior normal.        Judgment: Judgment normal.     BP 119/64 (BP Location: Right Arm, Patient Position: Sitting, Cuff Size: Small)   Pulse 86   Temp 98.1 F (36.7 C) (Oral)   Resp 16   Ht 5' 4" (1.626 m)   Wt 199 lb (90.3 kg)   SpO2 100%   BMI 34.16 kg/m  Wt Readings from Last 3 Encounters:  07/19/22 199 lb (90.3 kg)  04/12/22 197 lb (89.4 kg)  04/05/22 199 lb (90.3 kg)       Assessment & Plan:   Problem List Items Addressed This Visit       Unprioritized   Perimenopause - Primary    Wound not be a good candidate for OCP or HRT due to her migraines with aura.  Discussed trial of gabapentin or citalopram as needed for hot flashes.  She declines at this time and will let me know if her symptoms worsen.       Migraine with aura    Eye exam reportedly normal, MR Brain negative.  Neuro consult pending.   Recommended ok to take imitrex as needed for vision loss even if it is not accompanied by headache.       Globus sensation    New. Suspect secondary to gerd. Trial of omeprazole.       Compound heterozygous MTHFR mutation [id number]/[id number]   Relevant Orders   Homocysteine   Class 2 obesity with body mass index (BMI) of 36.0 to 36.9 in adult    Wt Readings from Last 3 Encounters:  07/19/22 199 lb (90.3 kg)  04/12/22 197 lb (89.4 kg)  04/05/22 199 lb (90.3 kg)        B12 deficiency    Will check follow up level. Continue monthly b12 injections.       Relevant Orders   B12   ADD (attention deficit disorder) without hyperactivity    Stable on adderall xr once daily. Continue same.       Other Visit Diagnoses     Hyperglycemia       Relevant Orders   Hemoglobin Q6S   Basic metabolic panel      Meds ordered this encounter  Medications   omeprazole (PRILOSEC) 40 MG capsule    Sig: Take 1 capsule (40 mg total) by mouth daily.    Dispense:  30 capsule    Refill:  1    Order Specific Question:  Supervising Provider    Answer:   Mosie Lukes [[id number]]    I, Nance Pear, NP, personally preformed the services described in this documentation.  All medical record entries made by the scribe were at my direction and in my presence.  I have reviewed the chart and discharge instructions (if applicable) and agree that the record reflects my personal performance and is accurate and complete. 07/19/2022   I,Amber Collins,acting as a scribe for Nance Pear, NP.,have documented all relevant documentation on the behalf of SHYLA GAYHEART, NP,as directed by  Nance Pear, NP while in the presence of Nance Pear, NP.    Nance Pear, NP

## 2022-07-19 NOTE — Assessment & Plan Note (Signed)
Stable on adderall xr once daily. Continue same.

## 2022-07-19 NOTE — Assessment & Plan Note (Signed)
Wound not be a good candidate for OCP or HRT due to her migraines with aura.  Discussed trial of gabapentin or citalopram as needed for hot flashes.  She declines at this time and will let me know if her symptoms worsen.

## 2022-07-19 NOTE — Assessment & Plan Note (Signed)
Wt Readings from Last 3 Encounters:  07/19/22 199 lb (90.3 kg)  04/12/22 197 lb (89.4 kg)  04/05/22 199 lb (90.3 kg)

## 2022-07-19 NOTE — Assessment & Plan Note (Signed)
Will check follow up level. Continue monthly b12 injections.

## 2022-07-22 LAB — HEMOGLOBIN A1C: Hgb A1c MFr Bld: 6.1 % (ref 4.6–6.5)

## 2022-07-31 IMAGING — MR MR HEAD W/O CM
7 series · 48 of 48 positions shown · non-contrast
Comparison: None Available.

CLINICAL DATA: Vision loss, known etiology; intermittent vision
loss left eye, diplopia

EXAM:
MRI HEAD WITHOUT CONTRAST
TECHNIQUE: Multiplanar, multiecho pulse sequences of the brain and surrounding
structures were obtained without intravenous contrast.

[Series 2: T1 · sagittal · 5.0mm · 0.90mm/px · 3 of 27 slices shown]
[im 1/27]
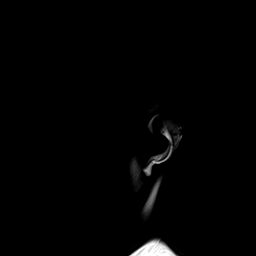
[im 14/27]
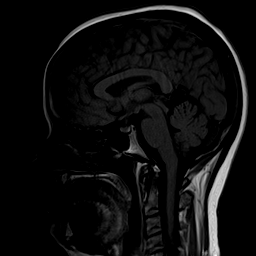
[im 27/27]
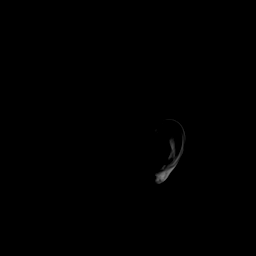

[Series 3: DWI · axial · 3.0mm · 1.88mm/px · z∈[-54,+99]mm · 13 of 96 slices shown (1 of 2)]
[im 1/96]
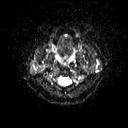
[im 8/96]
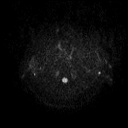
[im 16/96]
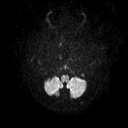
[im 24/96]
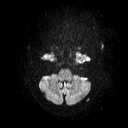
[im 32/96]
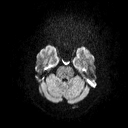
[im 40/96]
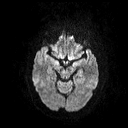
[im 48/96]
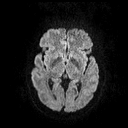
[im 56/96]
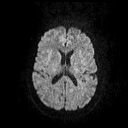
[im 64/96]
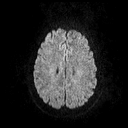
[im 72/96]
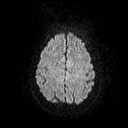
[im 80/96]
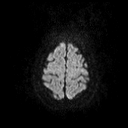
[im 88/96]
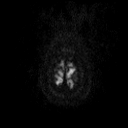
[im 96/96]
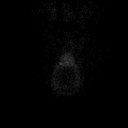

[Series 4: DWI · axial · 3.0mm · 1.88mm/px · z∈[-54,+99]mm · 7 of 48 slices shown (2 of 2)]
[im 1/48]
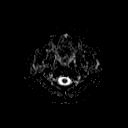
[im 8/48]
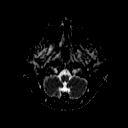
[im 16/48]
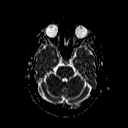
[im 24/48]
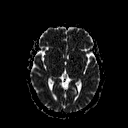
[im 32/48]
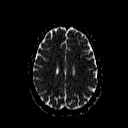
[im 40/48]
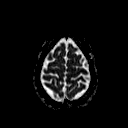
[im 48/48]
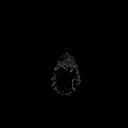

[Series 5: T2 · axial · 5.0mm · 0.69mm/px · z∈[-56,+104]mm · 4 of 28 slices shown (1 of 2)]
[im 1/28]
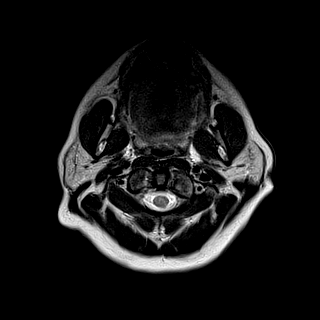
[im 10/28]
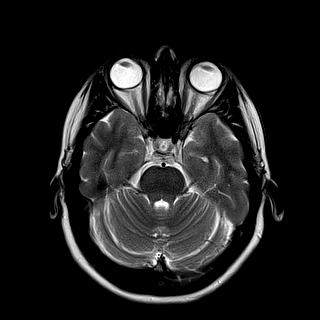
[im 19/28]
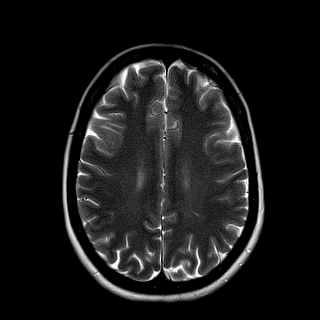
[im 28/28]
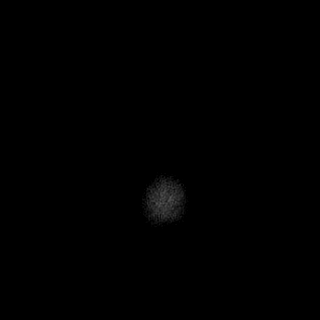

[Series 6: T2 · axial · 5.0mm · 0.43mm/px · z∈[-56,+104]mm · 4 of 28 slices shown (2 of 2)]
[im 1/28]
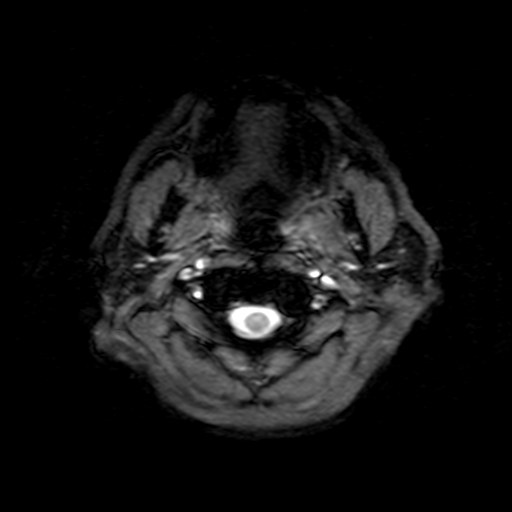
[im 10/28]
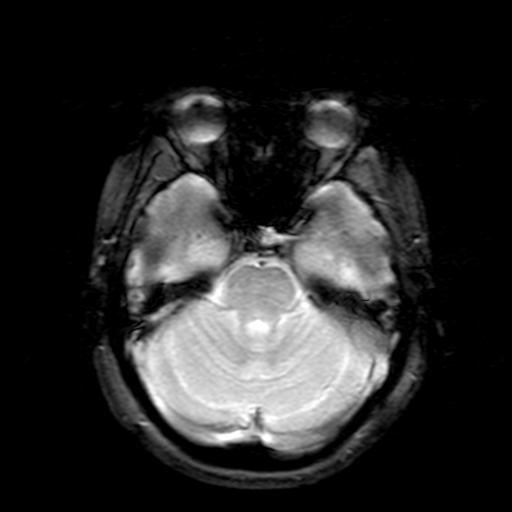
[im 19/28]
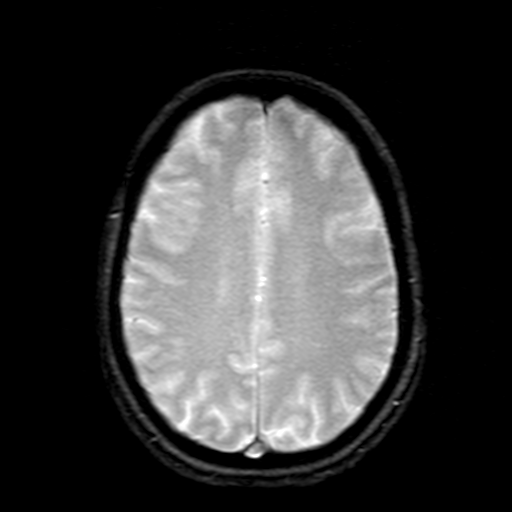
[im 28/28]
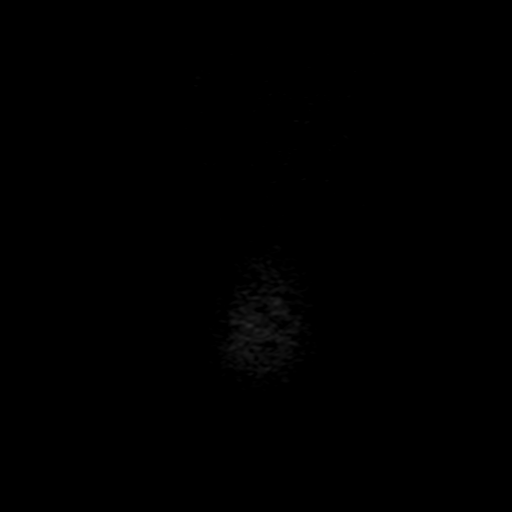

[Series 7: FLAIR · axial · 3.0mm · 0.43mm/px · z∈[-57,+104]mm · 6 of 42 slices shown]
[im 1/42]
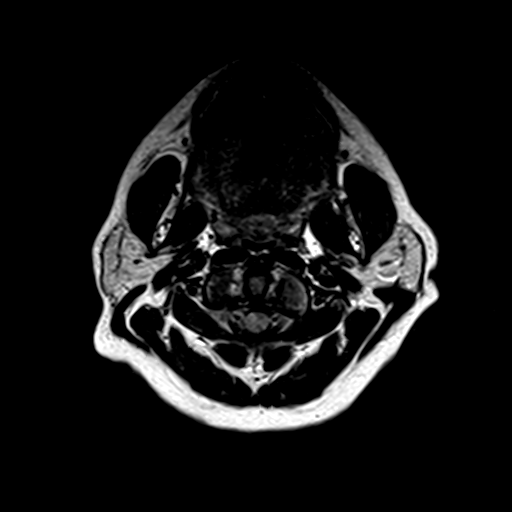
[im 9/42]
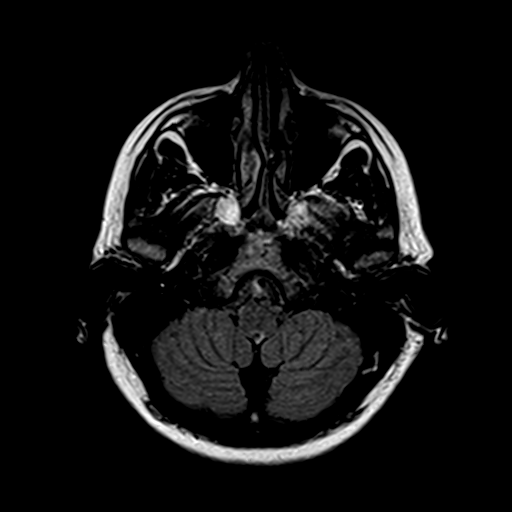
[im 17/42]
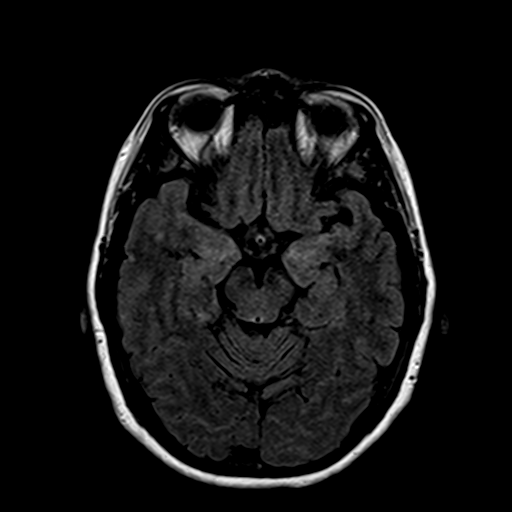
[im 25/42]
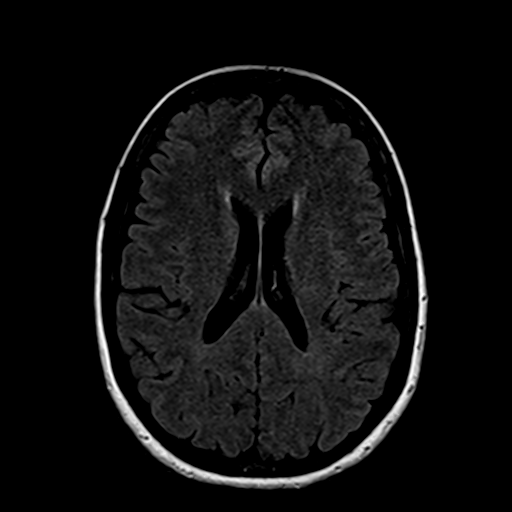
[im 33/42]
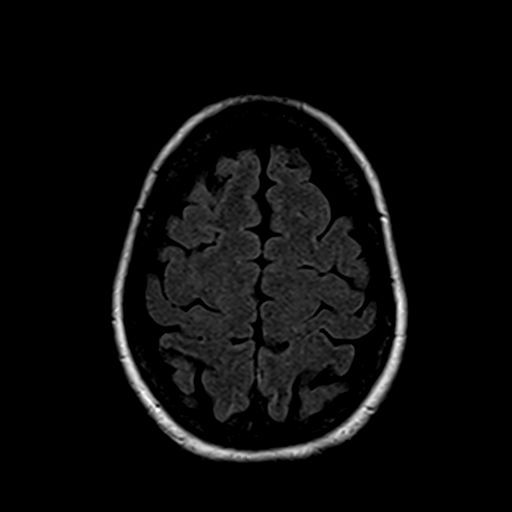
[im 42/42]
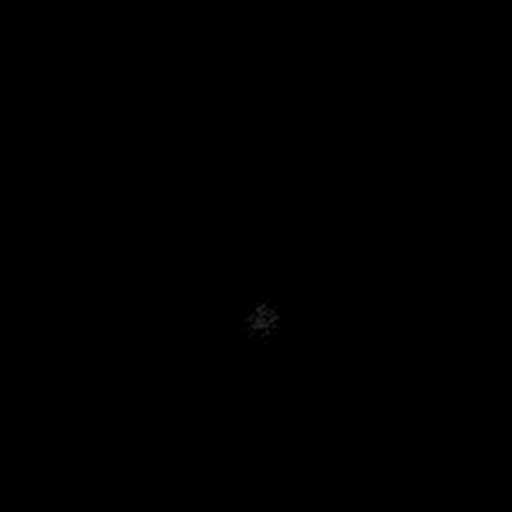

[Series 8: t1_3d_tra · axial · 2.0mm · 0.94mm/px · z∈[-57,+100]mm · 11 of 80 slices shown]
[im 1/80]
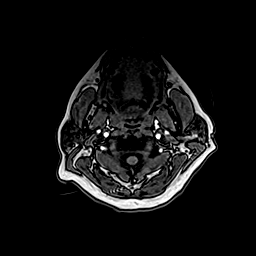
[im 8/80]
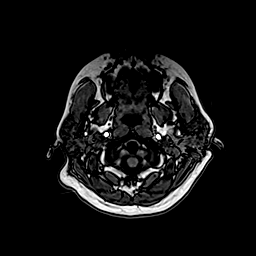
[im 16/80]
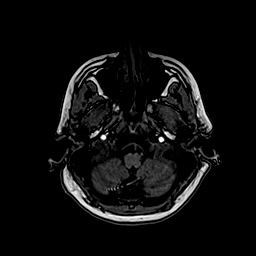
[im 24/80]
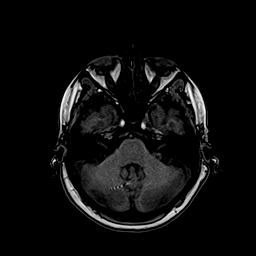
[im 32/80]
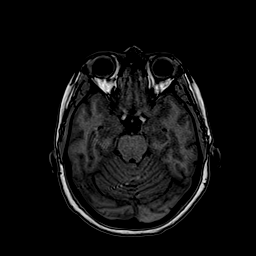
[im 40/80]
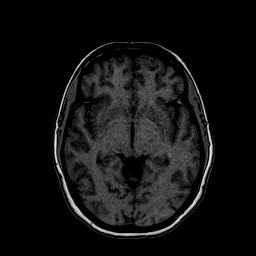
[im 48/80]
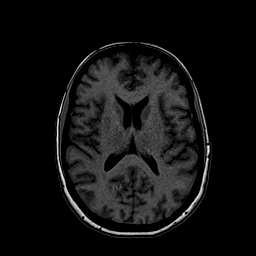
[im 56/80]
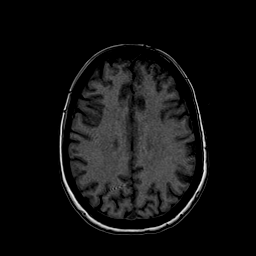
[im 64/80]
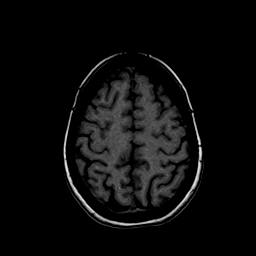
[im 72/80]
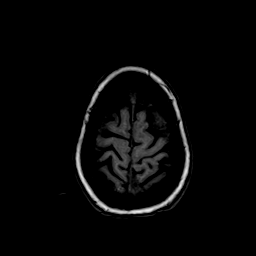
[im 80/80]
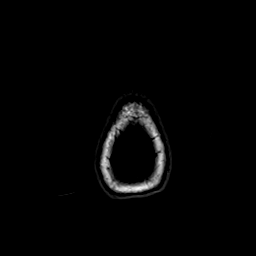

[48 of 48 positions shown; findings below may reference images not displayed]

FINDINGS: Brain: There is no acute infarction or intracranial hemorrhage.
There is no intracranial mass, mass effect, or edema. There is no
hydrocephalus or extra-axial fluid collection. Ventricles and sulci
are normal in size and configuration.

Vascular: Major vessel flow voids at the skull base are preserved.

Skull and upper cervical spine: Normal marrow signal is preserved.

Sinuses/Orbits: Paranasal sinuses are aerated. Orbits are
unremarkable.

Other: Sella is unremarkable.  Mastoid air cells are clear.
IMPRESSION: No evidence of recent infarction, hemorrhage, or mass. No finding to
account for reported symptoms.

## 2022-08-02 ENCOUNTER — Ambulatory Visit (INDEPENDENT_AMBULATORY_CARE_PROVIDER_SITE_OTHER): Payer: BC Managed Care – PPO

## 2022-08-02 DIAGNOSIS — E538 Deficiency of other specified B group vitamins: Secondary | ICD-10-CM

## 2022-08-02 MED ORDER — CYANOCOBALAMIN 1000 MCG/ML IJ SOLN
1000.0000 ug | Freq: Once | INTRAMUSCULAR | Status: AC
  Administered 2022-08-02: 1000 ug via INTRAMUSCULAR

## 2022-08-02 NOTE — Progress Notes (Signed)
Pt here for monthly B12 injection per Liat  B12 1036mcg given R deltoid IM, and pt tolerated injection well.  Next B12 injection scheduled for 1 month.

## 2022-08-21 NOTE — Progress Notes (Signed)
NEUROLOGY CONSULTATION NOTE  Barbara Barr MRN: [id number] DOB: 07/18/1976  Referring provider: [name], NP Primary care provider: [name], NP  Reason for consult:  vision changes  Assessment/Plan:   Ocular migraine  Start topiramate 41m at bedtime.  If no improvement in 4 weeks, we can increase to 573mat bedtime Limit use of pain relievers to no more than 2 days out of week to prevent risk of rebound or medication-overuse headache. Keep headache diary Follow up 4-5 months    Subjective:  Barbara Barr a [age]ear old female with ADHD who presents for vision loss.  History supplemented by referring provider's notes.  Over the past year, she has reported recurrent transient visual symptoms.  She would experience binocular blurred vision lasting up to an hour.  The blurred vision would resolve when closing either eye.  They have been occurring about twice a week.  No associated headache.  She had one of these episodes during a routine eye exam by her optometrist and exhibited significant worsening of vision compared to the previous year.. Marland KitchenShe was sent to ophthalmologist who did not find anything concerning on exam, however she wasn't having an episode at that time.  MRI of brain without contrast on 04/13/2022 personally reviewed was normal.  Migraines were suspected.  She does require new prescription lenses but her eye doctor doesn't to fit her until her migraines are better controlled.  She does have history of migraines since childhood.  When she would stand up or look up, she may sometimes develop darkening of vision with "fireworks" lasting less than a minute followed by a headache.  Headaches typically are a stabbing pain in back of head and behind the eyes, associated with photophobi and phonophobia, lasting a couple of days (usually 30 minutes if treated with sumatriptan).  They are infrequent.  She hasn't had a migraine headache in weeks.     Past  NSAIDS/analgesics:  naproxen, acetaminophen Past abortive triptans:  none Past abortive ergotamine:  none Past muscle relaxants:  none Past anti-emetic:  none Past antihypertensive medications:  none Past antidepressant medications:  bupropion Past anticonvulsant medications:  none Past anti-CGRP:  none Other past therapies:  none  Current NSAIDS/analgesics:  ibuprofen 80023murrent triptans:  sumatriptan 100m55mrrent ergotamine:  none Current anti-emetic:  none Current muscle relaxants:  none Current Antihypertensive medications:  none Current Antidepressant medications:  none Current Anticonvulsant medications:  none Current anti-CGRP:  none Other therapy:  none Birth control:  none Other medications:  Adderall   Family history of headache:  unknown.  She was adopted.        PAST MEDICAL HISTORY: Past Medical History:  Diagnosis Date   ADHD (attention deficit hyperactivity disorder)    Allergic rhinitis, seasonal    Anemia    Anxiety    B12 deficiency 10/31/2021   Cervical dysplasia    high grade   Environmental allergies    Headache    History of gestational diabetes    History of tobacco abuse 04/07/2018   Obsessive-compulsive disorder    PONV (postoperative nausea and vomiting)    Pre-diabetes    Wears glasses     PAST SURGICAL HISTORY: Past Surgical History:  Procedure Laterality Date   COLPOSCOPY N/A 05/12/2018   Procedure: COLPOSCOPY;  Surgeon: AmunNunzio Cobbs;  [facility];  Laterality: N/A;   LAPAROSCOPIC CHOLECYSTECTOMY  2003   LEEP N/A 05/12/2018  Procedure: LOOP ELECTROSURGICAL EXCISION PROCEDURE (LEEP) with ECC;  Surgeon: Nunzio Cobbs, MD;  Location: [facility];  Service: Gynecology;  Laterality: N/A;  with ECC   LEEP     2021   LEEP  2006   RHINOPLASTY  02/2018   TUBAL LIGATION Bilateral 2006    MEDICATIONS: Current Outpatient Medications on File  Prior to Visit  Medication Sig Dispense Refill   amphetamine-dextroamphetamine (ADDERALL XR) 25 MG 24 hr capsule Take 1 capsule by mouth every morning. 30 capsule 0   blood glucose meter kit and supplies KIT Dispense based on patient and insurance preference. Use up to four times daily as directed. 1 each 0   Cholecalciferol (VITAMIN D3) 75 MCG (3000 UT) TABS Take 1 tablet by mouth daily. 30 tablet    fluticasone (FLONASE) 50 MCG/ACT nasal spray Place 1 spray into both nostrils 2 (two) times daily. 16 g 3   folic acid (FOLVITE) 1 MG tablet Take 1 tablet (1 mg total) by mouth daily.     ibuprofen (ADVIL) 800 MG tablet Take 1 tablet (800 mg total) by mouth every 8 (eight) hours as needed. 30 tablet 0   Magnesium Stearate POWD 1,000 mg by Does not apply route daily.     metFORMIN (GLUCOPHAGE-XR) 500 MG 24 hr tablet      omeprazole (PRILOSEC) 40 MG capsule Take 1 capsule (40 mg total) by mouth daily. 30 capsule 1   ONETOUCH ULTRA test strip USE UP TO FOUR TIMES DAILY AS DIRECTED. 100 strip 2   Plecanatide (TRULANCE) 3 MG TABS Take 1 tablet by mouth daily. 30 tablet 2   SUMAtriptan (IMITREX) 100 MG tablet Take 1 tablet (100 mg total) by mouth every 2 (two) hours as needed for migraine (max 2 doses/24 hr). 10 tablet 5   No current facility-administered medications on file prior to visit.    ALLERGIES: Allergies  Allergen Reactions   Lexapro [Escitalopram] Hives   Penicillins Hives   Benadryl [Diphenhydramine] Rash   Codeine Itching    FAMILY HISTORY: Family History  Problem Relation Age of Onset   Drug abuse Mother        "was a prostitute"   Bipolar disorder Mother    Dementia Maternal Aunt    Cancer Maternal Aunt    Cervical cancer Maternal Aunt    Cancer Maternal Aunt    ADD / ADHD Maternal Uncle    Stroke Maternal Uncle    Heart attack Maternal Uncle     Objective:  Blood pressure 132/84, pulse (!) 112, resp. rate 20, height '5\' 5"'  (1.651 m), weight 200 lb (90.7 kg), SpO2 97  %. General: No acute distress.  Patient appears well-groomed.   Head:  Normocephalic/atraumatic Eyes:  fundi examined but not visualized Neck: supple, no paraspinal tenderness, full range of motion Back: No paraspinal tenderness Heart: regular rate and rhythm Lungs: Clear to auscultation bilaterally. Vascular: No carotid bruits. Neurological Exam: Mental status: alert and oriented to person, place, and time, speech fluent and not dysarthric, language intact. Cranial nerves: CN I: not tested CN II: pupils equal, round and reactive to light, visual fields intact CN III, IV, VI:  full range of motion, no nystagmus, no ptosis CN V: facial sensation intact. CN VII: upper and lower face symmetric CN VIII: hearing intact CN IX, X: gag intact, uvula midline CN XI: sternocleidomastoid and trapezius muscles intact CN XII: tongue midline Bulk & Tone: normal, no fasciculations. Motor:  muscle strength 5/5 throughout  Sensation:  Pinprick, temperature and vibratory sensation intact. Deep Tendon Reflexes:  2+ throughout,  toes downgoing.   Finger to nose testing:  Without dysmetria.   Heel to shin:  Without dysmetria.   Gait:  Normal station and stride.  Romberg negative.    Thank you for allowing me to take part in the care of this patient.  Metta Clines, DO  CC: [name], NP

## 2022-08-23 ENCOUNTER — Ambulatory Visit: Payer: BC Managed Care – PPO | Admitting: Neurology

## 2022-08-23 ENCOUNTER — Encounter: Payer: Self-pay | Admitting: Neurology

## 2022-08-23 VITALS — BP 132/84 | HR 112 | Resp 20 | Ht 65.0 in | Wt 200.0 lb

## 2022-08-23 DIAGNOSIS — G43109 Migraine with aura, not intractable, without status migrainosus: Secondary | ICD-10-CM

## 2022-08-23 MED ORDER — TOPIRAMATE 25 MG PO TABS: 25.0000 mg | ORAL_TABLET | Freq: Every day | ORAL | 5 refills | Status: DC

## 2022-08-23 NOTE — Patient Instructions (Signed)
  Start topiramate 25mg  at bedtime.  Contact us in 4 weeks with update and we can increase dose if needed. Limit use of pain relievers to no more than 2 days out of the week.  These medications include acetaminophen, NSAIDs (ibuprofen/Advil/Motrin, naproxen/Aleve, triptans (Imitrex/sumatriptan), Excedrin, and narcotics.  This will help reduce risk of rebound headaches. Be aware of common food triggers:  - Caffeine:  coffee, black tea, cola, Mt. Dew  - Chocolate  - Dairy:  aged cheeses (brie, blue, cheddar, gouda, [place], provolone, [place], Swiss, etc), chocolate milk, buttermilk, sour cream, limit eggs and yogurt  - Nuts, peanut butter  - Alcohol  - Cereals/grains:  FRESH breads (fresh bagels, sourdough, doughnuts), yeast productions  - Processed/canned/aged/cured meats (pre-packaged deli meats, hotdogs)  - MSG/glutamate:  soy sauce, flavor enhancer, pickled/preserved/marinated foods  - Sweeteners:  aspartame (Equal, Nutrasweet).  Sugar and Splenda are okay  - Vegetables:  legumes (lima beans, lentils, snow peas, fava beans, pinto peans, peas, garbanzo beans), sauerkraut, onions, olives, pickles  - Fruit:  avocados, bananas, citrus fruit (orange, lemon, grapefruit), mango  - Other:  Frozen meals, macaroni and cheese Routine exercise Stay adequately hydrated (aim for 64 oz water daily) Keep headache diary Maintain proper stress management Maintain proper sleep hygiene Do not skip meals Consider supplements:  magnesium citrate 400mg  daily, riboflavin 400mg  daily, coenzyme Q10 100mg  three times daily. Follow up 4-5 months.

## 2022-09-02 ENCOUNTER — Encounter: Payer: Self-pay | Admitting: Neurology

## 2022-09-06 ENCOUNTER — Ambulatory Visit (INDEPENDENT_AMBULATORY_CARE_PROVIDER_SITE_OTHER): Payer: BC Managed Care – PPO

## 2022-09-06 DIAGNOSIS — E538 Deficiency of other specified B group vitamins: Secondary | ICD-10-CM

## 2022-09-06 MED ORDER — CYANOCOBALAMIN 1000 MCG/ML IJ SOLN
1000.0000 ug | Freq: Once | INTRAMUSCULAR | Status: AC
  Administered 2022-09-06: 1000 ug via INTRAMUSCULAR

## 2022-09-06 NOTE — Progress Notes (Signed)
Pt here for monthly B12 injection per Arriona  B12 given R deltoid IM, and pt tolerated injection well.  Next B12 injection scheduled for 1 month.

## 2022-10-04 ENCOUNTER — Ambulatory Visit: Payer: BC Managed Care – PPO | Admitting: Dietician

## 2022-10-11 ENCOUNTER — Encounter: Payer: BC Managed Care – PPO | Attending: Family | Admitting: Dietician

## 2022-10-11 ENCOUNTER — Ambulatory Visit: Payer: BC Managed Care – PPO | Admitting: Family

## 2022-10-11 ENCOUNTER — Encounter: Payer: Self-pay | Admitting: Dietician

## 2022-10-11 ENCOUNTER — Other Ambulatory Visit (HOSPITAL_BASED_OUTPATIENT_CLINIC_OR_DEPARTMENT_OTHER): Payer: Self-pay

## 2022-10-11 VITALS — BP 121/92 | HR 110 | Temp 98.1°F | Resp 18 | Ht 64.0 in | Wt 201.4 lb

## 2022-10-11 DIAGNOSIS — R739 Hyperglycemia, unspecified: Secondary | ICD-10-CM

## 2022-10-11 DIAGNOSIS — R09A2 Foreign body sensation, throat: Secondary | ICD-10-CM

## 2022-10-11 DIAGNOSIS — K219 Gastro-esophageal reflux disease without esophagitis: Secondary | ICD-10-CM

## 2022-10-11 DIAGNOSIS — K589 Irritable bowel syndrome without diarrhea: Secondary | ICD-10-CM | POA: Diagnosis not present

## 2022-10-11 DIAGNOSIS — G43109 Migraine with aura, not intractable, without status migrainosus: Secondary | ICD-10-CM | POA: Diagnosis not present

## 2022-10-11 DIAGNOSIS — E669 Obesity, unspecified: Secondary | ICD-10-CM | POA: Diagnosis not present

## 2022-10-11 DIAGNOSIS — Z6836 Body mass index (BMI) 36.0-36.9, adult: Secondary | ICD-10-CM | POA: Insufficient documentation

## 2022-10-11 DIAGNOSIS — E538 Deficiency of other specified B group vitamins: Secondary | ICD-10-CM

## 2022-10-11 DIAGNOSIS — F988 Other specified behavioral and emotional disorders with onset usually occurring in childhood and adolescence: Secondary | ICD-10-CM

## 2022-10-11 DIAGNOSIS — R7303 Prediabetes: Secondary | ICD-10-CM | POA: Insufficient documentation

## 2022-10-11 DIAGNOSIS — R7302 Impaired glucose tolerance (oral): Secondary | ICD-10-CM

## 2022-10-11 MED ORDER — AMPHETAMINE-DEXTROAMPHET ER 25 MG PO CP24
25.0000 mg | ORAL_CAPSULE | ORAL | 0 refills | Status: DC
  Filled 2022-10-11 – 2023-02-10 (×3): qty 30, 30d supply, fill #0

## 2022-10-11 MED ORDER — FAMOTIDINE 20 MG PO TABS
20.0000 mg | ORAL_TABLET | Freq: Two times a day (BID) | ORAL | 1 refills | Status: DC
  Filled 2022-10-11: qty 60, 30d supply, fill #0

## 2022-10-11 MED ORDER — CYANOCOBALAMIN 1000 MCG/ML IJ SOLN
1000.0000 ug | Freq: Once | INTRAMUSCULAR | Status: AC
  Administered 2022-10-11: 1000 ug via INTRAMUSCULAR

## 2022-10-11 NOTE — Assessment & Plan Note (Addendum)
Comes and goes- did not tolerate omeprazole due to diarrhea.  Will give trial of pepcid.

## 2022-10-11 NOTE — Assessment & Plan Note (Signed)
Lab Results  Component Value Date   HGBA1C 6.1 07/19/2022   She has been off of metformin, because she did not think it was working. Home glucose is stable. Will monitor off of metformin.

## 2022-10-11 NOTE — Assessment & Plan Note (Signed)
She saw neuro and was placed on topamax.  She notes improvement in her migraines since she started.

## 2022-10-11 NOTE — Progress Notes (Signed)
Medical Nutrition Therapy  Appointment Start time:  1140  Appointment End time:  1230 Patient is here today alone.  She was last seen by myself on 06/28/2022.  Her MD started Omeprizol but this interacted with the her GI medication and caused incontinent stools and caused migraines.  She has stopped this.  [place] improved based on what she eats. Burps sulphur smell at times. She has taken a vitamin B-12 shot but still was fatigued. She is now taking L-Methylfolate - getting energy back, regulating sleep (with B-12 and folate).  She states that her blood glucose has been more even since starting this as well. Not taking Metformin as she felt it was not working. She is taking topromate for the migraines and this had made carbonated beverages and mayo taste bad. She has ocular migraines effecting her vision.  She states that her neurologist has given her a long list of items that she should not eat. (Migraine diet?) She wonders what she can eat.  States that she plans to get strict with her diet starting in January. Her neurologist instructed her to take Vitamin D with magnesium citrate, riboflavin, CoQ10 (4 times daily) Checking her blood glucose 3 times daily which is 70-90 fasting (improved from 120-130) about 120 after a meal. She has an appointment today with her nurse practitioner. No longer needs the medication for constipation.  Primary concerns today: Patient would like to improve her blood glucose.  States that she only eats once per day "if she is lucky" and sometimes will skip 2 days and not think about it until she starts to feel shaky.  Skips meals due to poor appetite on Adderal, ADD (distraction and forgets).  She would like to lose weight and is concerned that if she starts to eat more regularly or force herself to eat, she will then gain weight gain.  MRI of brain 04/13/2022 to determine cause of vision changes in left eye. Referral diagnosis: Weight gain Preferred learning style:  no preference indicated Learning readiness:  ready   NUTRITION ASSESSMENT  Anthropometrics  64.5" 197 lbs 04/12/2022 140 lbs lowest adult weight in 2006 Started gaining weight since that time due to unknown cause although around the same time as prediabetes was diagnosed. 199 lbs highest adult weight UBW recently 196-199 lbs  Brief nutrition exam completed.  Easily pluckable hair with increased hair loss, nail have vertical ridging which can be normal and otherwise nothing noted.  White spots on white of eyes.   Clinical Medical Hx: Prediabetes, history of GDM,  vitamin B-12 deficiency, obesity, ADHD, chronic constipation Medications: Metformin, folic acid, vitamin D, magnesium, potassium, vitamin B-12 injections (monthly), Trulance (for constipation)-stopped, adderall, methyl folate/B-12 Labs: Vitamin B-12 392 on 12/14/2021 increased from 188 on 10/30/2021 Folate 5.9 (low) 10/30/2021, A1C 6.1% 03/17/2022, Cholesterol 181, HDL 47, LDL 112, Triglycerides 109 on 12/14/2021 Notable Signs/Symptoms: BM every 1-2 days on Trulance and previous to that once per month, increased amounts of hair loss for the last 1 1/2 years.  Lifestyle & Dietary Hx Patient lives with her boyfriend and 4 children ages 13-20.  She does the shopping and cooking.  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  Estimated daily fluid intake: 06/2022 visit-175-205 oz daily from 4-5 (32 oz bottles of water, 12 oz of electrolyte drink and 2 cups coffee)  Chronic thirst,  history of slightly low sodium Sleep: improved after methyl folate/B-12 supplement Stress / self-care: very high stress, decreased self care Current average weekly physical activity: bone spurs at times don't allow this but has a treadmill, ecliptically, stationary bike at home, walks a dog daily for 60  minutes  24-Hr Dietary Recall - 06/28/2022 visit 1 meal daily and occasional forgets to eat First Meal: shot of espresso and protein shake Snack: none Second Meal: salad with bacon, tuna, chicken, cottage cheese, or egg Snack: none Third Meal 6-8 pm: leftovers OR chicken or other meat, vegetables, beans Snack: cottage cheese with sugar free cheesecake pudding mix and occasional fruit OR salsa with chips Beverages: water, electrolyte drink, Expresso shot with protein shake  Estimated Energy Needs Calories: 1400-1500 Protein: 70-80 g  NUTRITION DIAGNOSIS  NI-1.4 Inadequate energy intake As related to decreased appetite, stimulant use, and ADD.  As evidenced by patient report and diet hx.   NUTRITION INTERVENTION  Nutrition education (E-1) on the following topics:  Discussed current status of sleep, bowels, medication review, blood glucose and how she is feeling Answered patient questions regarding supplementation.  Discussed that magnesium citrate is generally for the bowels and may not be well tolerated in higher doses as it can cause diarrhea..  The magnesium threonate is more brain health related (she is currently taking this).  Magnesium glycinate is most absorbable.  Discussed diet recommended by her neurologist.  Based on this, developed a list of foods that would be on the allowed diet.  Handouts Provided Include (initial visit) How to Thrive:  A Guide for Your Journey with Diabetes by the ADA Meal Plan Card GERD nutritional therapy from [facility] (09/2022 visit)  Learning Style & Readiness for Change Teaching method utilized: Visual & Auditory  Demonstrated degree of understanding via: Teach Back  Barriers to learning/adherence to lifestyle change: poor appetite  Plan: Definitely avoid:  chocolate, fermented foods, cheese, caffeine Choose simple unprocessed foods  Based on the recommendations from your neurologist, the following foods are allowed:  (Ask him if this is an  elimination diet and you will slowly add things back.  What are the [facility] of this diet?)  Listen to your body.  Patient states that she plans to try this plan in January.   Proteins:  Fresh meat (buffalo, lean beef, lean pork, chicken, [place], fish)  ? Black beans, white beans, navy beans, kidney beans, red beans, black eyed peas (these were not on the no list but are legumes)   Starches:  Potatoes  Sweet potatoes  Acorn squash  Butternut squash  ?Quinoa /?Buckwheat .  These are seeds. But thought of as grains.    Vegetables:  Green beans  Cabbage, Brussels sprouts  Green beans  Broccoli  Cauliflower  Carrots  Celery  Spinach, Collards, lettuce  Summer squash, zucchini, spaghetti squash  Tomatoes  Cucumbers  Peppers  Asparagus  ?Mushrooms     Fruits:  Apples  Pears  Plums  Peaches  Grapes  Cherries  Berries  Melon  ? Dried fruit (raisin, cranberries, dates)   ?  Seeds, - flax seeds, chia seeds, sesame seed (tahini)  ? oils      MONITORING & EVALUATION Dietary intake, weekly physical activity in 2-3 months.  Next Steps  Patient is to call for needs.

## 2022-10-11 NOTE — Patient Instructions (Addendum)
   Should you get checked for h.pylori??  Burping suffer  Definitely avoid:  chocolate, fermented foods, cheese, caffeine Choose simple unprocessed foods  Adapted food list 10/17/22 based on further instructions patient received from MD. Based on the recommendations from your neurologist, the following foods are allowed:  (Ask him if this is an elimination diet and you will slowly add things back.  What are the [facility] of this diet?)  Listen to your body.   Proteins:  Fresh meat (buffalo, lean beef, lean pork, chicken, [place], fish)  ? Black beans, white beans, navy beans, kidney beans, red beans, black eyed peas (these were not on the no list but are legumes)   Starches:  Potatoes  Sweet potatoes  Acorn squash  Butternut squash  Corn  ?Quinoa /?Buckwheat .  These are seeds. But thought of as grains.  Oatmeal Cold cereal  Packaged commercial bread   Vegetables:  Green beans  Cabbage, Brussels sprouts  Green beans  Broccoli  Cauliflower  Carrots  Celery  Spinach, Collards, lettuce  Summer squash, zucchini, spaghetti squash  Tomatoes  Cucumbers  Peppers  Asparagus Garlic  ?Mushrooms     Fruits:  Apples  Pears  Plums  Peaches  Grapes  Cherries  Berries  Melon   Seeds, - flax seeds, chia seeds, sesame seed (tahini)  ? oils     Fun  White chocolate  Cake without chocolate or nuts [place]  Beverages Decaff coffee, herbal green tea, decaff soda

## 2022-10-11 NOTE — Assessment & Plan Note (Signed)
Stable/controlled. Continue Adderall.

## 2022-10-11 NOTE — Assessment & Plan Note (Signed)
She is usinh 5 MTHF 15mg  daily.

## 2022-10-11 NOTE — Progress Notes (Signed)
Subjective:   By signing my name below, I, Carylon Perches, attest that this documentation has been prepared under the direction and in the presence of Karie Chimera, NP 10/11/2022     Patient ID: Barbara Barr, female    DOB: 1976-06-01, 46 y.o.   MRN: [id number]  Chief Complaint  Patient presents with   Follow-up    Concerns/ questions: 1. Change in vitamins per Nutritionists. [facility] and Nutritionists will work on diet changes. 2. She wanted her tested for HPylori    HPI Patient is in today for an office visit  Refill: She is requesting a refill of 25 mg of Adderall XR. She states that she is responding well to the medication.   Lump in Throat: Since last visit, she was prescribed 40 mg of Omeprazole and reports of consistent diarrhea. She took the medication for about a week and a half. However, she states that the frequency of symptoms have decreased. Symptoms primarily occur after eating a meal and she notes of burping that smells like rotten eggs. She is requesting to receive a H pylori test for her symptoms.   Vitamin B-12: She noticed that she was sluggish about 3 weeks after receiving her Vitamin B-12 injections. Her nutritionist recommended a B-12 alternative and therefore she is taking 5-MTHF. Overall, she feels better. She reports that she takes one tablet per morning after eating breakfast. She notes that her supplement also contains folic acid.   Ocular Migraine: She was seen by Dr.Jaffee on 08/23/2022. She was prescribed 25 mg of Topiramate and reports of an overall improvement of her symptoms. She is confused about what foods to consume to avoid her symptoms. She states that her nutritionist will be speaking to [facility].  Vision: She is UTD on vision exams   A1C: Her A1C levels are borderline. She has been regularly recording her blood sugars and states that numbers have been great when recorded in the mornings. As of 2 weeks, she states that her sugars have been  great after meals too. She has been off of Metformin for about two weeks. She stopped the medication because she did not see a change.  Lab Results  Component Value Date   HGBA1C 6.1 07/19/2022   Magnesium/D3: She is currently taking a magnesium and D3 combo pill and reports that she is responding well to it.   Gynecology: Her insurance denied coverage towards her gynecology appointment due to a mistake in billing. Since then, she reports that things have been settled and is planning to be seen by gynecologists.   Health Maintenance Due  Topic Date Due   PAP SMEAR-Modifier  10/30/2021    Past Medical History:  Diagnosis Date   ADHD (attention deficit hyperactivity disorder)    Allergic rhinitis, seasonal    Anemia    Anxiety    B12 deficiency 10/31/2021   Cervical dysplasia    high grade   Environmental allergies    Headache    History of gestational diabetes    History of tobacco abuse 04/07/2018   Obsessive-compulsive disorder    PONV (postoperative nausea and vomiting)    Pre-diabetes    Wears glasses     Past Surgical History:  Procedure Laterality Date   COLPOSCOPY N/A 05/12/2018   Procedure: COLPOSCOPY;  Surgeon: Nunzio Cobbs, MD;  Location: [facility];  Service: Gynecology;  Laterality: N/A;   LAPAROSCOPIC CHOLECYSTECTOMY  2003   LEEP N/A 05/12/2018   Procedure: LOOP ELECTROSURGICAL  EXCISION PROCEDURE (LEEP) with ECC;  Surgeon: Nunzio Cobbs, MD;  Location: [facility];  Service: Gynecology;  Laterality: N/A;  with ECC   LEEP     2021   LEEP  2006   RHINOPLASTY  02/2018   TUBAL LIGATION Bilateral 2006    Family History  Problem Relation Age of Onset   Drug abuse Mother        "was a prostitute"   Bipolar disorder Mother    Dementia Maternal Aunt    Cancer Maternal Aunt    Cervical cancer Maternal Aunt    Cancer Maternal Aunt    ADD / ADHD Maternal Uncle    Stroke Maternal Uncle    Heart attack  Maternal Uncle     Social History   Socioeconomic History   Marital status: Soil scientist    Spouse name: Not on file   Number of children: Not on file   Years of education: Not on file   Highest education level: Not on file  Occupational History   Not on file  Tobacco Use   Smoking status: Former    Packs/day: 0.50    Years: 25.00    Total pack years: 12.50    Types: Cigarettes    Start date: 01/26/2021   Smokeless tobacco: Never  Vaping Use   Vaping Use: Every day  Substance and Sexual Activity   Alcohol use: No   Drug use: No   Sexual activity: Yes    Birth control/protection: Surgical  Other Topics Concern   Not on file  Social History Narrative   Adopted by her great aunt on her mother's side   Has a half sister with her biological mom   Works as an Financial planner   Lives with her boyfriend   2003- son Randall Hiss   2006- Daughter [name]   Enjoys building furniture/arts and crafts/fishing   Completed bachelor's degree   2 [employer]   Social Determinants of Health   Financial Resource Strain: Not on file  Food Insecurity: Not on file  Transportation Needs: Not on file  Physical Activity: Sufficiently Active (10/30/2021)   Exercise Vital Sign    Days of Exercise per Week: 3 days    Minutes of Exercise per Session: 60 min  Stress: Not on file  Social Connections: Not on file  Intimate Partner Violence: Not on file    Outpatient Medications Prior to Visit  Medication Sig Dispense Refill   blood glucose meter kit and supplies KIT Dispense based on patient and insurance preference. Use up to four times daily as directed. 1 each 0   Cholecalciferol (VITAMIN D3) 75 MCG (3000 UT) TABS Take 1 tablet by mouth daily. 30 tablet    fluticasone (FLONASE) 50 MCG/ACT nasal spray Place 1 spray into both nostrils 2 (two) times daily. 16 g 3   folic acid (FOLVITE) 1 MG tablet Take 1 tablet (1 mg total) by mouth daily.     ibuprofen (ADVIL) 800 MG tablet Take 1  tablet (800 mg total) by mouth every 8 (eight) hours as needed. 30 tablet 0   Magnesium Stearate POWD Take 1,000 mg by mouth daily.     metFORMIN (GLUCOPHAGE-XR) 500 MG 24 hr tablet  (Patient not taking: Reported on 10/11/2022)     omeprazole (PRILOSEC) 40 MG capsule Take 1 capsule (40 mg total) by mouth daily. 30 capsule 1   ONETOUCH ULTRA test strip USE UP TO FOUR TIMES DAILY AS DIRECTED. 100  strip 2   Plecanatide (TRULANCE) 3 MG TABS Take 1 tablet by mouth daily. (Patient not taking: Reported on 10/11/2022) 30 tablet 2   SUMAtriptan (IMITREX) 100 MG tablet Take 1 tablet (100 mg total) by mouth every 2 (two) hours as needed for migraine (max 2 doses/24 hr). 10 tablet 5   topiramate (TOPAMAX) 25 MG tablet Take 1 tablet (25 mg total) by mouth at bedtime. 30 tablet 5   amphetamine-dextroamphetamine (ADDERALL XR) 25 MG 24 hr capsule Take 1 capsule by mouth every morning. 30 capsule 0   No facility-administered medications prior to visit.    Allergies  Allergen Reactions   Lexapro [Escitalopram] Hives   Penicillins Hives   Benadryl [Diphenhydramine] Rash   Codeine Itching   Prilosec [Omeprazole]     Severe diarrhea    ROS    See HPI Objective:    Physical Exam Constitutional:      General: She is not in acute distress.    Appearance: Normal appearance. She is not ill-appearing.  HENT:     Head: Normocephalic and atraumatic.     Right Ear: External ear normal.     Left Ear: External ear normal.  Eyes:     Extraocular Movements: Extraocular movements intact.     Pupils: Pupils are equal, round, and reactive to light.  Neck:     Thyroid: No thyromegaly.  Cardiovascular:     Rate and Rhythm: Normal rate and regular rhythm.     Heart sounds: Normal heart sounds. No murmur heard.    No gallop.  Pulmonary:     Effort: Pulmonary effort is normal. No respiratory distress.     Breath sounds: Normal breath sounds. No wheezing or rales.  Lymphadenopathy:     Cervical: No cervical  adenopathy.  Skin:    General: Skin is warm and dry.  Neurological:     Mental Status: She is alert and oriented to person, place, and time.  Psychiatric:        Mood and Affect: Mood normal.        Behavior: Behavior normal.        Judgment: Judgment normal.     BP (!) 121/92 (BP Location: Left Arm, Patient Position: Sitting, Cuff Size: Normal)   Pulse (!) 110   Temp 98.1 F (36.7 C) (Oral)   Resp 18   Ht _0  (1.626 m)   Wt 201 lb 6.4 oz (91.4 kg)   SpO2 100%   BMI 34.57 kg/m  Wt Readings from Last 3 Encounters:  10/11/22 201 lb 6.4 oz (91.4 kg)  08/23/22 200 lb (90.7 kg)  07/19/22 199 lb (90.3 kg)       Assessment & Plan:   Problem List Items Addressed This Visit       Unprioritized   Migraine with aura    She saw neuro and was placed on topamax.  She notes improvement in her migraines since she started.       IGT (impaired glucose tolerance)    Lab Results  Component Value Date   HGBA1C 6.1 07/19/2022  She has been off of metformin, because she did not think it was working. Home glucose is stable. Will monitor off of metformin.       Globus sensation - Primary    Comes and goes- did not tolerate omeprazole due to diarrhea.  Will give trial of pepcid.        Relevant Medications   famotidine (PEPCID) 20 MG tablet   B12  deficiency    She is usinh 5 MTHF 90m daily.       Relevant Orders   B12 and Folate Panel   Other Visit Diagnoses     Hyperglycemia       Relevant Orders   HgB A1c   Gastroesophageal reflux disease, unspecified whether esophagitis present       Relevant Medications   famotidine (PEPCID) 20 MG tablet   Other Relevant Orders   H. pylori breath test      B12 shot today. Meds ordered this encounter  Medications   famotidine (PEPCID) 20 MG tablet    Sig: Take 1 tablet (20 mg total) by mouth 2 (two) times daily.    Dispense:  60 tablet    Refill:  1    Order Specific Question:   Supervising Provider    Answer:   BPenni HomansA [[id number]]   amphetamine-dextroamphetamine (ADDERALL XR) 25 MG 24 hr capsule    Sig: Take 1 capsule by mouth every morning.    Dispense:  30 capsule    Refill:  0    Order Specific Question:   Supervising Provider    Answer:   BPenni HomansA [[id number]]   cyanocobalamin (VITAMIN B12) injection 1,000 mcg    I, Barbara Pear NP, personally preformed the services described in this documentation.  All medical record entries made by the scribe were at my direction and in my presence.  I have reviewed the chart and discharge instructions (if applicable) and agree that the record reflects my personal performance and is accurate and complete. 10/11/2022   I,Amber Collins,acting as a scribe for Barbara Pear NP.,have documented all relevant documentation on the behalf of Barbara PIANKA NP,as directed by  Barbara Pear NP while in the presence of Barbara Pear NP.    Barbara Pear NP

## 2022-10-12 LAB — B12 AND FOLATE PANEL
Folate: >24 ng/mL
Vitamin B-12: 484 pg/mL (ref 200–1100)

## 2022-10-12 LAB — HEMOGLOBIN A1C
Hgb A1c MFr Bld: 6.4 % of total Hgb — ABNORMAL HIGH (ref <5.7)
Mean Plasma Glucose: 137 mg/dL
eAG (mmol/L): 7.6 mmol/L

## 2022-10-15 ENCOUNTER — Encounter: Payer: Self-pay | Admitting: Neurology

## 2022-10-16 LAB — H. PYLORI BREATH TEST: H. pylori Breath Test: NOT DETECTED

## 2022-10-18 ENCOUNTER — Other Ambulatory Visit (HOSPITAL_BASED_OUTPATIENT_CLINIC_OR_DEPARTMENT_OTHER): Payer: Self-pay

## 2022-10-22 ENCOUNTER — Other Ambulatory Visit (HOSPITAL_BASED_OUTPATIENT_CLINIC_OR_DEPARTMENT_OTHER): Payer: Self-pay

## 2022-11-01 ENCOUNTER — Ambulatory Visit: Payer: BC Managed Care – PPO | Admitting: Family

## 2022-11-01 NOTE — Progress Notes (Incomplete)
Subjective:   By signing my name below, I, Barbara Barr, attest that this documentation has been prepared under the direction and in the presence of Barbara Pear, NP 11/01/22   Patient ID: Barbara Barr, female    DOB: 27-Oct-1976, 47 y.o.   MRN: [id number]  No chief complaint on file.   HPI Patient is in today for 2 week follow up.  GERD: Did not tolerate Omeprazole due to diarrhea. Started on trial of Famotidine on 10/11/22.  Impaired glucose tolerance: *** Lab Results  Component Value Date   HGBA1C 6.4 (H) 10/11/2022     Past Medical History:  Diagnosis Date   ADHD (attention deficit hyperactivity disorder)    Allergic rhinitis, seasonal    Anemia    Anxiety    B12 deficiency 10/31/2021   Cervical dysplasia    high grade   Environmental allergies    Headache    History of gestational diabetes    History of tobacco abuse 04/07/2018   Obsessive-compulsive disorder    PONV (postoperative nausea and vomiting)    Pre-diabetes    Wears glasses     Past Surgical History:  Procedure Laterality Date   COLPOSCOPY N/A 05/12/2018   Procedure: COLPOSCOPY;  Surgeon: Nunzio Cobbs, MD;  Location: [facility];  Service: Gynecology;  Laterality: N/A;   LAPAROSCOPIC CHOLECYSTECTOMY  2003   LEEP N/A 05/12/2018   Procedure: LOOP ELECTROSURGICAL EXCISION PROCEDURE (LEEP) with ECC;  Surgeon: Nunzio Cobbs, MD;  Location: [facility];  Service: Gynecology;  Laterality: N/A;  with ECC   LEEP     2021   LEEP  2006   RHINOPLASTY  02/2018   TUBAL LIGATION Bilateral 2006    Family History  Problem Relation Age of Onset   Drug abuse Mother        "was a prostitute"   Bipolar disorder Mother    Dementia Maternal Aunt    Cancer Maternal Aunt    Cervical cancer Maternal Aunt    Cancer Maternal Aunt    ADD / ADHD Maternal Uncle    Stroke Maternal Uncle    Heart attack Maternal Uncle     Social History    Socioeconomic History   Marital status: Barbara Barr    Spouse name: Not on file   Number of children: Not on file   Years of education: Not on file   Highest education level: Not on file  Occupational History   Not on file  Tobacco Use   Smoking status: Former    Packs/day: 0.50    Years: 25.00    Total pack years: 12.50    Types: Cigarettes    Start date: 01/26/2021   Smokeless tobacco: Never  Vaping Use   Vaping Use: Every day  Substance and Sexual Activity   Alcohol use: No   Drug use: No   Sexual activity: Yes    Birth control/protection: Surgical  Other Topics Concern   Not on file  Social History Narrative   Adopted by her great aunt on her mother's side   Has a half sister with her biological mom   Works as an Financial planner   Lives with her boyfriend   2003- son Barbara Barr   2006- Daughter Barbara Barr   Enjoys building furniture/arts and crafts/fishing   Completed bachelor's degree   2 [employer] shepherds   Social Determinants of Radio broadcast assistant Strain: Not on [employer]  Insecurity: Not on file  Transportation Needs: Not on file  Physical Activity: Sufficiently Active (10/30/2021)   Exercise Vital Sign    Days of Exercise per Week: 3 days    Minutes of Exercise per Session: 60 min  Stress: Not on file  Social Connections: Not on file  Intimate Partner Violence: Not on file    Outpatient Medications Prior to Visit  Medication Sig Dispense Refill   amphetamine-dextroamphetamine (ADDERALL XR) 25 MG 24 hr capsule Take 1 capsule by mouth every morning. 30 capsule 0   blood glucose meter kit and supplies KIT Dispense based on patient and insurance preference. Use up to four times daily as directed. 1 each 0   Cholecalciferol (VITAMIN D3) 75 MCG (3000 UT) TABS Take 1 tablet by mouth daily. 30 tablet    famotidine (PEPCID) 20 MG tablet Take 1 tablet (20 mg total) by mouth 2 (two) times daily. 60 tablet 1   fluticasone (FLONASE) 50 MCG/ACT nasal  spray Place 1 spray into both nostrils 2 (two) times daily. 16 g 3   folic acid (FOLVITE) 1 MG tablet Take 1 tablet (1 mg total) by mouth daily.     ibuprofen (ADVIL) 800 MG tablet Take 1 tablet (800 mg total) by mouth every 8 (eight) hours as needed. 30 tablet 0   Magnesium Stearate POWD Take 1,000 mg by mouth daily.     metFORMIN (GLUCOPHAGE-XR) 500 MG 24 hr tablet  (Patient not taking: Reported on 10/11/2022)     omeprazole (PRILOSEC) 40 MG capsule Take 1 capsule (40 mg total) by mouth daily. 30 capsule 1   ONETOUCH ULTRA test strip USE UP TO FOUR TIMES DAILY AS DIRECTED. 100 strip 2   Plecanatide (TRULANCE) 3 MG TABS Take 1 tablet by mouth daily. (Patient not taking: Reported on 10/11/2022) 30 tablet 2   SUMAtriptan (IMITREX) 100 MG tablet Take 1 tablet (100 mg total) by mouth every 2 (two) hours as needed for migraine (max 2 doses/24 hr). 10 tablet 5   topiramate (TOPAMAX) 25 MG tablet Take 1 tablet (25 mg total) by mouth at bedtime. 30 tablet 5   No facility-administered medications prior to visit.    Allergies  Allergen Reactions   Lexapro [Escitalopram] Hives   Penicillins Hives   Benadryl [Diphenhydramine] Rash   Codeine Itching   Prilosec [Omeprazole]     Severe diarrhea    ROS     Objective:    Physical Exam  There were no vitals taken for this visit. Wt Readings from Last 3 Encounters:  10/11/22 201 lb 6.4 oz (91.4 kg)  08/23/22 200 lb (90.7 kg)  07/19/22 199 lb (90.3 kg)       Assessment & Plan:  There are no diagnoses linked to this encounter.   I,Barbara Barr,acting as a Education administrator for [employer], NP.,have documented all relevant documentation on the behalf of Barbara TERRANCE, NP,as directed by  Barbara Pear, NP while in the presence of Barbara Pear, NP.   I, Barbara Barr, personally preformed the services described in this documentation.  All medical record entries made by the scribe were at my direction and in my presence.   I have reviewed the chart and discharge instructions (if applicable) and agree that the record reflects my personal performance and is accurate and complete. 11/01/22   Barbara Barr

## 2022-11-05 DIAGNOSIS — D3132 Benign neoplasm of left choroid: Secondary | ICD-10-CM | POA: Diagnosis not present

## 2022-11-05 DIAGNOSIS — Z135 Encounter for screening for eye and ear disorders: Secondary | ICD-10-CM | POA: Diagnosis not present

## 2022-11-05 DIAGNOSIS — E119 Type 2 diabetes mellitus without complications: Secondary | ICD-10-CM | POA: Diagnosis not present

## 2022-12-06 ENCOUNTER — Telehealth: Payer: Self-pay

## 2022-12-06 NOTE — Telephone Encounter (Signed)
  Adhya Greenup (Key: [id number]) Trulance 3MG  tablets Status: PA Response - Approved

## 2022-12-06 NOTE — Telephone Encounter (Signed)
PA initiated via Covermymeds; [id number]. Awaiting determination.

## 2022-12-08 ENCOUNTER — Other Ambulatory Visit (HOSPITAL_BASED_OUTPATIENT_CLINIC_OR_DEPARTMENT_OTHER): Payer: Self-pay

## 2022-12-09 ENCOUNTER — Other Ambulatory Visit (HOSPITAL_BASED_OUTPATIENT_CLINIC_OR_DEPARTMENT_OTHER): Payer: Self-pay

## 2022-12-09 ENCOUNTER — Other Ambulatory Visit: Payer: Self-pay

## 2022-12-09 NOTE — Telephone Encounter (Signed)
  Barbara Barr (Key: [id number]) Trulance 3MG tablets Status: PA Response - Approved  

## 2022-12-13 ENCOUNTER — Ambulatory Visit: Payer: BC Managed Care – PPO | Admitting: Dietician

## 2022-12-16 ENCOUNTER — Telehealth: Payer: Self-pay | Admitting: Family

## 2022-12-16 ENCOUNTER — Other Ambulatory Visit: Payer: Self-pay | Admitting: Family

## 2022-12-16 NOTE — Telephone Encounter (Signed)
Lvm for patient to be aware of provider's comments to repeat b12 and she also wanted to see patient for  follow up. Patient needs to call for follow up with provider and she will get a b12 level check at the same appointment.

## 2022-12-16 NOTE — Telephone Encounter (Signed)
I made myself a note that I wanted to bring her back for a b12 level in the lab please? Dx b12 deficiency.

## 2022-12-18 NOTE — Telephone Encounter (Signed)
Patient scheduled for 02/21/23

## 2022-12-20 ENCOUNTER — Other Ambulatory Visit (HOSPITAL_BASED_OUTPATIENT_CLINIC_OR_DEPARTMENT_OTHER): Payer: Self-pay

## 2022-12-23 ENCOUNTER — Other Ambulatory Visit (HOSPITAL_BASED_OUTPATIENT_CLINIC_OR_DEPARTMENT_OTHER): Payer: Self-pay

## 2022-12-24 ENCOUNTER — Other Ambulatory Visit: Payer: Self-pay

## 2022-12-25 NOTE — Progress Notes (Signed)
Virtual Visit via Video Note  Consent was obtained for video visit:  Yes.   Answered questions that patient had about telehealth interaction:  Yes.   I discussed the limitations, risks, security and privacy concerns of performing an evaluation and management service by telemedicine. I also discussed with the patient that there may be a patient responsible charge related to this service. The patient expressed understanding and agreed to proceed.  Pt location: Home Physician Location: office Name of referring provider:  [name], NP I connected with Hal Hope at patients initiation/request on 12/27/2022 at  1:50 PM EST by video enabled telemedicine application and verified that I am speaking with the correct person using two identifiers. Pt MRN:  [id number] Pt DOB:  22-Oct-1976 Video Participants:  Hal Hope;  Assessment/Plan:    Ocular migraine   Migraine prevention:  topiramate '25mg'$  at bedtime Migraine rescue:  sumatriptan '100mg'$  Limit use of pain relievers to no more than 2 days out of week to prevent risk of rebound or medication-overuse headache. Keep headache diary Follow up one year       Subjective:  Barbara Barr is a 47 year old female with ADHD who follows up for ocular migraines.  UPDATE: Started topiramate last appointment.  Also has new glasses prescription.  No migraines or ocular migraines.    Current NSAIDS/analgesics:  ibuprofen '800mg'$  Current triptans:  sumatriptan '100mg'$  Current ergotamine:  none Current anti-emetic:  none Current muscle relaxants:  none Current Antihypertensive medications:  none Current Antidepressant medications:  none Current Anticonvulsant medications:  topiramate '25mg'$  at bedtime Current anti-CGRP:  none Other therapy:  none Birth control:  none Other medications:  Adderall   HISTORY: Beginning in early 2023, she has reported recurrent transient visual symptoms.  She would experience binocular blurred  vision lasting up to an hour.  The blurred vision would resolve when closing either eye.  They have been occurring about twice a week.  No associated headache.  She had one of these episodes during a routine eye exam by her optometrist and exhibited significant worsening of vision compared to the previous year.Marland Kitchen  She was sent to ophthalmologist who did not find anything concerning on exam, however she wasn't having an episode at that time.  MRI of brain without contrast on 04/13/2022 personally reviewed was normal.  Migraines were suspected.  She does require new prescription lenses but her eye doctor doesn't to fit her until her migraines are better controlled.   She does have history of migraines since childhood.  When she would stand up or look up, she may sometimes develop darkening of vision with "fireworks" lasting less than a minute followed by a headache.  Headaches typically are a stabbing pain in back of head and behind the eyes, associated with photophobi and phonophobia, lasting a couple of days (usually 30 minutes if treated with sumatriptan).  They are infrequent.  She hasn't had a migraine headache in weeks.       Past NSAIDS/analgesics:  naproxen, acetaminophen Past abortive triptans:  none Past abortive ergotamine:  none Past muscle relaxants:  none Past anti-emetic:  none Past antihypertensive medications:  none Past antidepressant medications:  bupropion Past anticonvulsant medications:  none Past anti-CGRP:  none Other past therapies:  none     Family history of headache:  unknown.  She was adopted.    Past Medical History: Past Medical History:  Diagnosis Date   ADHD (attention deficit hyperactivity disorder)    Allergic rhinitis, seasonal  Anemia    Anxiety    B12 deficiency 10/31/2021   Cervical dysplasia    high grade   Environmental allergies    Headache    History of gestational diabetes    History of tobacco abuse 04/07/2018   Obsessive-compulsive disorder     PONV (postoperative nausea and vomiting)    Pre-diabetes    Wears glasses     Medications: Outpatient Encounter Medications as of 12/27/2022  Medication Sig Note   amphetamine-dextroamphetamine (ADDERALL XR) 25 MG 24 hr capsule Take 1 capsule by mouth every morning.    blood glucose meter kit and supplies KIT Dispense based on patient and insurance preference. Use up to four times daily as directed.    Cholecalciferol (VITAMIN D3) 75 MCG (3000 UT) TABS Take 1 tablet by mouth daily.    famotidine (PEPCID) 20 MG tablet Take 1 tablet (20 mg total) by mouth 2 (two) times daily.    fluticasone (FLONASE) 50 MCG/ACT nasal spray Place 1 spray into both nostrils 2 (two) times daily.    folic acid (FOLVITE) 1 MG tablet Take 1 tablet (1 mg total) by mouth daily.    ibuprofen (ADVIL) 800 MG tablet Take 1 tablet (800 mg total) by mouth every 8 (eight) hours as needed.    Magnesium Stearate POWD Take 1,000 mg by mouth daily.    metFORMIN (GLUCOPHAGE-XR) 500 MG 24 hr tablet  (Patient not taking: Reported on 10/11/2022)    ONETOUCH ULTRA test strip USE UP TO FOUR TIMES DAILY AS DIRECTED.    Plecanatide (TRULANCE) 3 MG TABS Take 1 tablet by mouth daily. (Patient not taking: Reported on 10/11/2022) 08/23/2022: As needed   SUMAtriptan (IMITREX) 100 MG tablet Take 1 tablet (100 mg total) by mouth every 2 (two) hours as needed for migraine (max 2 doses/24 hr).    topiramate (TOPAMAX) 25 MG tablet Take 1 tablet (25 mg total) by mouth at bedtime.    No facility-administered encounter medications on file as of 12/27/2022.    Allergies: Allergies  Allergen Reactions   Lexapro [Escitalopram] Hives   Penicillins Hives   Benadryl [Diphenhydramine] Rash   Codeine Itching   Prilosec [Omeprazole]     Severe diarrhea    Family History: Family History  Problem Relation Age of Onset   Drug abuse Mother        "was a prostitute"   Bipolar disorder Mother    Dementia Maternal Aunt    Cancer Maternal Aunt     Cervical cancer Maternal Aunt    Cancer Maternal Aunt    ADD / ADHD Maternal Uncle    Stroke Maternal Uncle    Heart attack Maternal Uncle     Observations/Objective:   No acute distress.  Alert and oriented.  Speech fluent and not dysarthric.  Language intact.     Follow Up Instructions:    -I discussed the assessment and treatment plan with the patient. The patient was provided an opportunity to ask questions and all were answered. The patient agreed with the plan and demonstrated an understanding of the instructions.   The patient was advised to call back or seek an in-person evaluation if the symptoms worsen or if the condition fails to improve as anticipated.   Dudley Major, DO

## 2022-12-27 ENCOUNTER — Telehealth (INDEPENDENT_AMBULATORY_CARE_PROVIDER_SITE_OTHER): Payer: BC Managed Care – PPO | Admitting: Neurology

## 2022-12-27 ENCOUNTER — Encounter: Payer: Self-pay | Admitting: Neurology

## 2022-12-27 DIAGNOSIS — G43109 Migraine with aura, not intractable, without status migrainosus: Secondary | ICD-10-CM

## 2022-12-27 MED ORDER — SUMATRIPTAN SUCCINATE 100 MG PO TABS: ORAL_TABLET | ORAL | 5 refills | Status: DC

## 2022-12-27 MED ORDER — TOPIRAMATE 25 MG PO TABS: 25.0000 mg | ORAL_TABLET | Freq: Every day | ORAL | 11 refills | Status: DC

## 2023-02-10 ENCOUNTER — Other Ambulatory Visit (HOSPITAL_BASED_OUTPATIENT_CLINIC_OR_DEPARTMENT_OTHER): Payer: Self-pay

## 2023-02-21 ENCOUNTER — Encounter: Payer: BC Managed Care – PPO | Admitting: Family

## 2023-02-28 ENCOUNTER — Ambulatory Visit (INDEPENDENT_AMBULATORY_CARE_PROVIDER_SITE_OTHER): Payer: BC Managed Care – PPO | Admitting: Family

## 2023-02-28 ENCOUNTER — Encounter: Payer: Self-pay | Admitting: Family

## 2023-02-28 VITALS — BP 109/64 | HR 103 | Temp 98.0°F | Resp 16 | Wt 196.0 lb

## 2023-02-28 DIAGNOSIS — Z1211 Encounter for screening for malignant neoplasm of colon: Secondary | ICD-10-CM

## 2023-02-28 DIAGNOSIS — E538 Deficiency of other specified B group vitamins: Secondary | ICD-10-CM

## 2023-02-28 DIAGNOSIS — F909 Attention-deficit hyperactivity disorder, unspecified type: Secondary | ICD-10-CM | POA: Diagnosis not present

## 2023-02-28 DIAGNOSIS — Z0001 Encounter for general adult medical examination with abnormal findings: Secondary | ICD-10-CM | POA: Diagnosis not present

## 2023-02-28 DIAGNOSIS — R232 Flushing: Secondary | ICD-10-CM | POA: Diagnosis not present

## 2023-02-28 DIAGNOSIS — Z0000 Encounter for general adult medical examination without abnormal findings: Secondary | ICD-10-CM

## 2023-02-28 DIAGNOSIS — Z1231 Encounter for screening mammogram for malignant neoplasm of breast: Secondary | ICD-10-CM

## 2023-02-28 DIAGNOSIS — R739 Hyperglycemia, unspecified: Secondary | ICD-10-CM

## 2023-02-28 DIAGNOSIS — F988 Other specified behavioral and emotional disorders with onset usually occurring in childhood and adolescence: Secondary | ICD-10-CM

## 2023-02-28 DIAGNOSIS — E559 Vitamin D deficiency, unspecified: Secondary | ICD-10-CM | POA: Diagnosis not present

## 2023-02-28 MED ORDER — AMPHETAMINE-DEXTROAMPHET ER 25 MG PO CP24
25.0000 mg | ORAL_CAPSULE | ORAL | 0 refills | Status: DC
Start: 2023-02-28 — End: 2023-04-03

## 2023-02-28 MED ORDER — GABAPENTIN 300 MG PO CAPS: 300.0000 mg | ORAL_CAPSULE | Freq: Every day | ORAL | 1 refills | Status: DC

## 2023-02-28 NOTE — Assessment & Plan Note (Signed)
Uncontrolled at night. Will give a trial of gabapentin 300mg  PO QHS.

## 2023-02-28 NOTE — Progress Notes (Signed)
Subjective:     Patient ID: Barbara Barr, female    DOB: January 25, 1976, 48 y.o.   MRN: [id number]  Chief Complaint  Patient presents with   Annual Exam    HPI Patient is in today for annual physical.  Immunizations: Tdap up to date Diet:  healthy Exercise: starting to get back into exercise, was having foot problems.  Wt Readings from Last 3 Encounters:  02/28/23 196 lb (88.9 kg)  10/11/22 201 lb 6.4 oz (91.4 kg)  08/23/22 200 lb (90.7 kg)  Colonoscopy: will be due later this month. Pap Smear: scheduled for later this month- had abnormal pap.  Mammogram: ordered Vision: up to date Dental: up to date  Health Maintenance Due  Topic Date Due   PAP SMEAR-Modifier  10/30/2021    Past Medical History:  Diagnosis Date   ADHD (attention deficit hyperactivity disorder)    Allergic rhinitis, seasonal    Anemia    Anxiety    B12 deficiency 10/31/2021   Cervical dysplasia    high grade   Environmental allergies    Headache    History of gestational diabetes    History of tobacco abuse 04/07/2018   Obsessive-compulsive disorder    PONV (postoperative nausea and vomiting)    Pre-diabetes    Wears glasses     Past Surgical History:  Procedure Laterality Date   COLPOSCOPY N/A 05/12/2018   Procedure: COLPOSCOPY;  Surgeon: Patton Salles, MD;  Location: [facility];  Service: Gynecology;  Laterality: N/A;   LAPAROSCOPIC CHOLECYSTECTOMY  2003   LEEP N/A 05/12/2018   Procedure: LOOP ELECTROSURGICAL EXCISION PROCEDURE (LEEP) with ECC;  Surgeon: Patton Salles, MD;  Location: [facility];  Service: Gynecology;  Laterality: N/A;  with ECC   LEEP     2021   LEEP  2006   RHINOPLASTY  02/2018   TUBAL LIGATION Bilateral 2006    Family History  Problem Relation Age of Onset   Drug abuse Mother        "was a prostitute"   Bipolar disorder Mother    Dementia Maternal Aunt    Cancer Maternal Aunt    Cervical cancer Maternal  Aunt    Cancer Maternal Aunt    ADD / ADHD Maternal Uncle    Stroke Maternal Uncle    Heart attack Maternal Uncle     Social History   Socioeconomic History   Marital status: Media planner    Spouse name: Not on file   Number of children: Not on file   Years of education: Not on file   Highest education level: Not on file  Occupational History   Not on file  Tobacco Use   Smoking status: Former    Packs/day: 0.50    Years: 25.00    Additional pack years: 0.00    Total pack years: 12.50    Types: Cigarettes    Start date: 01/26/2021   Smokeless tobacco: Never  Vaping Use   Vaping Use: Every day  Substance and Sexual Activity   Alcohol use: No   Drug use: No   Sexual activity: Yes    Birth control/protection: Surgical  Other Topics Concern   Not on file  Social History Narrative   Adopted by her great aunt on her mother's side   Has a half sister with her biological mom   Works as an Engineer, civil (consulting)   Lives with her boyfriend   2003- son  Eric   2006- Daughter [name]   Enjoys building furniture/arts and crafts/fishing   Completed bachelor's degree   2 [employer]   Social Determinants of Health   Financial Resource Strain: Not on file  Food Insecurity: Not on file  Transportation Needs: Not on file  Physical Activity: Sufficiently Active (10/30/2021)   Exercise Vital Sign    Days of Exercise per Week: 3 days    Minutes of Exercise per Session: 60 min  Stress: Not on file  Social Connections: Not on file  Intimate Partner Violence: Not on file    Outpatient Medications Prior to Visit  Medication Sig Dispense Refill   blood glucose meter kit and supplies KIT Dispense based on patient and insurance preference. Use up to four times daily as directed. 1 each 0   Cholecalciferol (VITAMIN D3) 75 MCG (3000 UT) TABS Take 1 tablet by mouth daily. 30 tablet    famotidine (PEPCID) 20 MG tablet Take 1 tablet (20 mg total) by mouth 2 (two) times daily. 60  tablet 1   fluticasone (FLONASE) 50 MCG/ACT nasal spray Place 1 spray into both nostrils 2 (two) times daily. 16 g 3   folic acid (FOLVITE) 1 MG tablet Take 1 tablet (1 mg total) by mouth daily.     ibuprofen (ADVIL) 800 MG tablet Take 1 tablet (800 mg total) by mouth every 8 (eight) hours as needed. 30 tablet 0   L-Tyrosine 1000 MG TABS Take by mouth.     Magnesium Stearate POWD Take 1,000 mg by mouth daily.     ONETOUCH ULTRA test strip USE UP TO FOUR TIMES DAILY AS DIRECTED. 100 strip 2   Plecanatide (TRULANCE) 3 MG TABS Take 1 tablet by mouth daily. 30 tablet 2   SUMAtriptan (IMITREX) 100 MG tablet Take 1 tablet as needed.  May repeat after 2 hours.  Maximum 2 tablets in 24 hours. 10 tablet 5   topiramate (TOPAMAX) 25 MG tablet Take 1 tablet (25 mg total) by mouth at bedtime. 30 tablet 11   amphetamine-dextroamphetamine (ADDERALL XR) 25 MG 24 hr capsule Take 1 capsule by mouth every morning. 30 capsule 0   No facility-administered medications prior to visit.    Allergies  Allergen Reactions   Lexapro [Escitalopram] Hives   Penicillins Hives   Benadryl [Diphenhydramine] Rash   Codeine Itching   Prilosec [Omeprazole]     Severe diarrhea    Review of Systems  Constitutional:  Negative for weight loss.  HENT:  Negative for congestion and hearing loss.   Eyes:  Negative for blurred vision.  Respiratory:  Negative for cough.   Cardiovascular:  Negative for chest pain.  Gastrointestinal:  Negative for constipation and diarrhea.  Genitourinary:  Negative for dysuria and frequency.  Musculoskeletal:  Positive for joint pain (wrists, shoulders,knees). Negative for myalgias.  Skin:  Negative for rash.  Neurological:  Negative for headaches.  Endo/Heme/Allergies:        Notes hot flashes, mostly at night.   Psychiatric/Behavioral:  Negative for depression.        Objective:    Physical Exam  BP 109/64 (BP Location: Right Arm, Patient Position: Sitting, Cuff Size: Large)   Pulse  (!) 103   Temp 98 F (36.7 C) (Oral)   Resp 16   Wt 196 lb (88.9 kg)   SpO2 98%   BMI 33.64 kg/m  Wt Readings from Last 3 Encounters:  02/28/23 196 lb (88.9 kg)  10/11/22 201 lb 6.4 oz (91.4 kg)  08/23/22 200 lb (90.7 kg)   Physical Exam  Constitutional: She is oriented to person, place, and time. She appears well-developed and well-nourished. No distress.  HENT:  Head: Normocephalic and atraumatic.  Right Ear: Tympanic membrane and ear canal normal.  Left Ear: Tympanic membrane and ear canal normal.  Mouth/Throat: Oropharynx is clear and moist.  Eyes: Pupils are equal, round, and reactive to light. No scleral icterus.  Neck: Normal range of motion. No thyromegaly present.  Cardiovascular: Normal rate and regular rhythm.   No murmur heard. Pulmonary/Chest: Effort normal and breath sounds normal. No respiratory distress. He has no wheezes. She has no rales. She exhibits no tenderness.  Abdominal: Soft. Bowel sounds are normal. She exhibits no distension and no mass. There is no tenderness. There is no rebound and no guarding.  Musculoskeletal: She exhibits no edema.  Lymphadenopathy:    She has no cervical adenopathy.  Neurological: She is alert and oriented to person, place, and time. She has normal patellar reflexes. She exhibits normal muscle tone. Coordination normal.  Skin: Skin is warm and dry.  Psychiatric: She has a normal mood and affect. Her behavior is normal. Judgment and thought content normal.  Breast/pelvic: deferred          Assessment & Plan:       Assessment & Plan:   Problem List Items Addressed This Visit       Unprioritized   Preventative health care - Primary    Continue efforts with healthy diet, exercise and weight loss. She will schedule mammogram and colonoscopy.  She had an abnormal pap which she tells me was done at [facility].  It sounds like she is going to have a colposcopy but is also having a laparoscopic procedure.  She will send me the  name of her GYN and we will contact them for records and pap smear results. Tetanus up to date.       Relevant Orders   Lipid panel   Hot flashes    Uncontrolled at night. Will give a trial of gabapentin 300mg  PO QHS.       Relevant Medications   gabapentin (NEURONTIN) 300 MG capsule   B12 deficiency   Relevant Orders   B12 and Folate Panel   HgB A1c   Attention deficit hyperactivity disorder (ADHD)    Stable on adderall xr. Controlled substance contract is updated today. Will obtain follow up UDS.       Relevant Medications   amphetamine-dextroamphetamine (ADDERALL XR) 25 MG 24 hr capsule   Other Relevant Orders   DRUG MONITORING, PANEL 8 WITH CONFIRMATION, URINE   Other Visit Diagnoses     Screening for colon cancer       Relevant Orders   Ambulatory referral to Gastroenterology   Breast cancer screening by mammogram       Relevant Orders   MM 3D SCREENING MAMMOGRAM BILATERAL BREAST   Hyperglycemia       Relevant Orders   Comp Met (CMET)   HgB A1c   Vitamin D deficiency       Relevant Orders   Vitamin D (25 hydroxy)       I am having Shiane A. Kehl start on gabapentin. I am also having her maintain her fluticasone, Vitamin D3, folic acid, Trulance, blood glucose meter kit and supplies, OneTouch Ultra, Magnesium Stearate, ibuprofen, famotidine, SUMAtriptan, topiramate, L-Tyrosine, and amphetamine-dextroamphetamine.  Meds ordered this encounter  Medications   gabapentin (NEURONTIN) 300 MG capsule    Sig: Take  1 capsule (300 mg total) by mouth at bedtime.    Dispense:  90 capsule    Refill:  1    Order Specific Question:   Supervising Provider    Answer:   Danise Edge A [[id number]]   amphetamine-dextroamphetamine (ADDERALL XR) 25 MG 24 hr capsule    Sig: Take 1 capsule by mouth every morning.    Dispense:  30 capsule    Refill:  0    Order Specific Question:   Supervising Provider    Answer:   Danise Edge A [[id number]]

## 2023-02-28 NOTE — Assessment & Plan Note (Signed)
Stable on adderall xr. Controlled substance contract is updated today. Will obtain follow up UDS.

## 2023-02-28 NOTE — Assessment & Plan Note (Signed)
Continue efforts with healthy diet, exercise and weight loss. She will schedule mammogram and colonoscopy.  She had an abnormal pap which she tells me was done at [facility].  It sounds like she is going to have a colposcopy but is also having a laparoscopic procedure.  She will send me the name of her GYN and we will contact them for records and pap smear results. Tetanus up to date.

## 2023-03-01 ENCOUNTER — Encounter: Payer: Self-pay | Admitting: Family

## 2023-03-01 LAB — LIPID PANEL
Cholesterol: 166 mg/dL (ref <200)
HDL: 44 mg/dL — ABNORMAL LOW (ref >=50)
LDL Cholesterol (Calc): 98 mg/dL (calc)
Non-HDL Cholesterol (Calc): 122 mg/dL (calc) (ref <130)
Total CHOL/HDL Ratio: 3.8 (calc) (ref <5.0)
Triglycerides: 138 mg/dL (ref <150)

## 2023-03-01 LAB — COMPREHENSIVE METABOLIC PANEL
AG Ratio: 1.5 (calc) (ref 1.0–2.5)
ALT: 14 U/L (ref 6–29)
AST: 18 U/L (ref 10–30)
Albumin: 4.1 g/dL (ref 3.6–5.1)
Alkaline phosphatase (APISO): 68 U/L (ref 31–125)
BUN: 11 mg/dL (ref 7–25)
CO2: 29 mmol/L (ref 20–32)
Calcium: 9.3 mg/dL (ref 8.6–10.2)
Chloride: 102 mmol/L (ref 98–110)
Creat: 0.77 mg/dL (ref 0.50–0.99)
Globulin: 2.8 g/dL (calc) (ref 1.9–3.7)
Glucose, Bld: 98 mg/dL (ref 65–99)
Potassium: 4 mmol/L (ref 3.5–5.3)
Sodium: 138 mmol/L (ref 135–146)
Total Bilirubin: 0.4 mg/dL (ref 0.2–1.2)
Total Protein: 6.9 g/dL (ref 6.1–8.1)

## 2023-03-01 LAB — HEMOGLOBIN A1C
Hgb A1c MFr Bld: 6.3 % of total Hgb — ABNORMAL HIGH (ref <5.7)
Mean Plasma Glucose: 134 mg/dL
eAG (mmol/L): 7.4 mmol/L

## 2023-03-01 LAB — VITAMIN D 25 HYDROXY (VIT D DEFICIENCY, FRACTURES): Vit D, 25-Hydroxy: 31 ng/mL (ref 30–100)

## 2023-03-01 LAB — B12 AND FOLATE PANEL
Folate: >24 ng/mL
Vitamin B-12: 514 pg/mL (ref 200–1100)

## 2023-03-02 LAB — DRUG MONITORING, PANEL 8 WITH CONFIRMATION, URINE
6 Acetylmorphine: NEGATIVE ng/mL (ref <10)
Alcohol Metabolites: NEGATIVE ng/mL (ref <500)
Amphetamine: 11139 ng/mL — ABNORMAL HIGH (ref <250)
Amphetamines: POSITIVE ng/mL — AB (ref <500)
Benzodiazepines: NEGATIVE ng/mL (ref <100)
Buprenorphine, Urine: NEGATIVE ng/mL (ref <5)
Cocaine Metabolite: NEGATIVE ng/mL (ref <150)
Creatinine: 247.8 mg/dL (ref >=20.0)
MDMA: NEGATIVE ng/mL (ref <500)
Marijuana Metabolite: NEGATIVE ng/mL (ref <20)
Methamphetamine: NEGATIVE ng/mL (ref <250)
Opiates: NEGATIVE ng/mL (ref <100)
Oxidant: NEGATIVE ug/mL (ref <200)
Oxycodone: NEGATIVE ng/mL (ref <100)
pH: 6.7 (ref 4.5–9.0)

## 2023-03-02 LAB — DM TEMPLATE

## 2023-03-02 MED ORDER — WEGOVY 0.25 MG/0.5ML ~~LOC~~ SOAJ: 0.2500 mg | SUBCUTANEOUS | 0 refills | Status: DC

## 2023-03-05 ENCOUNTER — Other Ambulatory Visit: Payer: Self-pay | Admitting: Neurology

## 2023-03-19 ENCOUNTER — Encounter: Payer: Self-pay | Admitting: Family

## 2023-03-19 ENCOUNTER — Telehealth: Payer: Self-pay

## 2023-03-19 NOTE — Telephone Encounter (Signed)
PA initiated via Covermymeds; KEY: [id number]. Awaiting determination.

## 2023-03-20 NOTE — Telephone Encounter (Signed)
PA denied.   Denied. This health benefit plan does not cover the following services, supplies, drugs or charges: Any treatment or regimen, medical or surgical, for the purpose of reducing or controlling the weight of the member, or for the treatment of obesity, except for surgical treatment of morbid obesity, or as specifically covered by this health benefit plan. 

## 2023-03-21 NOTE — Telephone Encounter (Signed)
Patient notified of PA denial

## 2023-04-03 ENCOUNTER — Encounter (HOSPITAL_BASED_OUTPATIENT_CLINIC_OR_DEPARTMENT_OTHER): Payer: Self-pay

## 2023-04-03 ENCOUNTER — Ambulatory Visit (HOSPITAL_BASED_OUTPATIENT_CLINIC_OR_DEPARTMENT_OTHER)
Admission: RE | Admit: 2023-04-03 | Discharge: 2023-04-03 | Disposition: A | Discharge status: Home / Self Care | Payer: BC Managed Care – PPO | Source: Ambulatory Visit | Attending: Family | Admitting: Family

## 2023-04-03 ENCOUNTER — Other Ambulatory Visit: Payer: Self-pay | Admitting: Family

## 2023-04-03 DIAGNOSIS — F909 Attention-deficit hyperactivity disorder, unspecified type: Secondary | ICD-10-CM

## 2023-04-03 DIAGNOSIS — Z1231 Encounter for screening mammogram for malignant neoplasm of breast: Secondary | ICD-10-CM | POA: Diagnosis not present

## 2023-04-03 NOTE — Telephone Encounter (Signed)
Pt came in office stating had sent mychart message (mychart message from  03-19-23) regarding about getting a coupon that would be a different med that will help her with weight loss (that the coupon will allow her to get the meds under the price of 150.00 monthly) pt is wanting to get that coupon - will be able to pick up tomorrow if possible. Please advise.

## 2023-04-03 NOTE — Telephone Encounter (Signed)
Requesting: Adderall XR 25mg  Contract: 02/28/23 UDS: 02/28/23 Last Visit: 02/28/23 Next Visit: 09/05/23 Last Refill: 02/28/23 #30 and 0RF  Please Advise

## 2023-04-04 ENCOUNTER — Encounter: Payer: Self-pay | Admitting: Family

## 2023-04-04 ENCOUNTER — Other Ambulatory Visit: Payer: Self-pay

## 2023-04-04 ENCOUNTER — Other Ambulatory Visit (HOSPITAL_BASED_OUTPATIENT_CLINIC_OR_DEPARTMENT_OTHER): Payer: Self-pay

## 2023-04-04 DIAGNOSIS — F909 Attention-deficit hyperactivity disorder, unspecified type: Secondary | ICD-10-CM

## 2023-04-04 MED ORDER — AMPHETAMINE-DEXTROAMPHET ER 25 MG PO CP24
25.0000 mg | ORAL_CAPSULE | ORAL | 0 refills | Status: DC
Start: 2023-04-04 — End: 2023-04-04
  Filled 2023-04-04: qty 30, 30d supply, fill #0

## 2023-04-04 MED ORDER — AMPHETAMINE-DEXTROAMPHET ER 25 MG PO CP24
25.0000 mg | ORAL_CAPSULE | ORAL | 0 refills | Status: DC
Start: 2023-04-04 — End: 2023-05-12
  Filled 2023-04-04 (×2): qty 30, 30d supply, fill #0

## 2023-05-12 ENCOUNTER — Other Ambulatory Visit: Payer: Self-pay | Admitting: Family

## 2023-05-12 ENCOUNTER — Other Ambulatory Visit (HOSPITAL_BASED_OUTPATIENT_CLINIC_OR_DEPARTMENT_OTHER): Payer: Self-pay

## 2023-05-12 DIAGNOSIS — F909 Attention-deficit hyperactivity disorder, unspecified type: Secondary | ICD-10-CM

## 2023-05-12 MED ORDER — IBUPROFEN 800 MG PO TABS
800.0000 mg | ORAL_TABLET | Freq: Three times a day (TID) | ORAL | 0 refills | Status: DC | PRN
  Filled 2023-05-12: qty 30, 10d supply, fill #0

## 2023-05-12 MED ORDER — AMPHETAMINE-DEXTROAMPHET ER 25 MG PO CP24
25.0000 mg | ORAL_CAPSULE | ORAL | 0 refills | Status: DC
Start: 2023-05-12 — End: 2023-06-12
  Filled 2023-05-12: qty 30, 30d supply, fill #0

## 2023-05-12 NOTE — Telephone Encounter (Signed)
Requesting: Adderall XR  Contract: 02/28/2023 UDS: 02/28/2023 Last Visit: 02/28/2023 Next Visit: 09/05/2023 Last Refill: 04/04/2023  Please Advise

## 2023-06-12 ENCOUNTER — Other Ambulatory Visit: Payer: Self-pay | Admitting: Family

## 2023-06-12 DIAGNOSIS — F909 Attention-deficit hyperactivity disorder, unspecified type: Secondary | ICD-10-CM

## 2023-06-13 ENCOUNTER — Other Ambulatory Visit (HOSPITAL_BASED_OUTPATIENT_CLINIC_OR_DEPARTMENT_OTHER): Payer: Self-pay

## 2023-06-13 ENCOUNTER — Other Ambulatory Visit: Payer: Self-pay

## 2023-06-13 MED ORDER — AMPHETAMINE-DEXTROAMPHET ER 25 MG PO CP24
25.0000 mg | ORAL_CAPSULE | ORAL | 0 refills | Status: DC
Start: 2023-06-13 — End: 2023-06-20
  Filled 2023-06-13: qty 30, 30d supply, fill #0

## 2023-06-20 ENCOUNTER — Ambulatory Visit: Payer: BC Managed Care – PPO | Admitting: Family

## 2023-06-20 VITALS — BP 122/70 | HR 97 | Temp 98.4°F | Resp 16 | Wt 188.0 lb

## 2023-06-20 DIAGNOSIS — F419 Anxiety disorder, unspecified: Secondary | ICD-10-CM | POA: Diagnosis not present

## 2023-06-20 DIAGNOSIS — L2381 Allergic contact dermatitis due to animal (cat) (dog) dander: Secondary | ICD-10-CM

## 2023-06-20 DIAGNOSIS — F909 Attention-deficit hyperactivity disorder, unspecified type: Secondary | ICD-10-CM

## 2023-06-20 DIAGNOSIS — R252 Cramp and spasm: Secondary | ICD-10-CM | POA: Diagnosis not present

## 2023-06-20 DIAGNOSIS — J3081 Allergic rhinitis due to animal (cat) (dog) hair and dander: Secondary | ICD-10-CM | POA: Insufficient documentation

## 2023-06-20 DIAGNOSIS — R7302 Impaired glucose tolerance (oral): Secondary | ICD-10-CM | POA: Diagnosis not present

## 2023-06-20 MED ORDER — BUSPIRONE HCL 7.5 MG PO TABS: 7.5000 mg | ORAL_TABLET | Freq: Two times a day (BID) | ORAL | 0 refills | Status: DC

## 2023-06-20 NOTE — Progress Notes (Signed)
Subjective:     Patient ID: Barbara Barr, female    DOB: 13-Jan-1976, 47 y.o.   MRN: [id number]  Chief Complaint  Patient presents with   Allergic Reaction    Patient report skin itching, nasal congestion and drainage. This started after getting a cat about 2 months ago.     Allergic Reaction    Discussed the use of AI scribe software for clinical note transcription with the patient, who gave verbal consent to proceed.  History of Present Illness   The patient presents with a two-month history of allergic symptoms, including itching, congestion, and drainage, which began after a cat was brought into her home. She reports severe itching, to the point of breaking skin and bleeding, particularly after contact with surfaces the cat has been on. Her eyes itch and water. She has never owned a cat before and has always had dogs. She has tried Claritin, which helped to stop the itching. She also uses Flonase as needed for nasal congestion but has not needed it since having her sinus cavities surgically widened. She has a known allergy to Benadryl, which causes her to break out in hives. The presence of the cat in the home is causing significant distress.     She also notes significant stress and anxiety at work.  Notes that she is still having some focusing issues despite taking her adderall xr 25mg . Feels like her mind never shuts down. Has trouble sleeping because her mind races.   Reports some cramping in her legs and feet and is requesting magnesium check.  She would also like to update her A1C. She has been working really hard on improving her diet.  Wt Readings from Last 3 Encounters:  06/20/23 188 lb (85.3 kg)  02/28/23 196 lb (88.9 kg)  10/11/22 201 lb 6.4 oz (91.4 kg)      Health Maintenance Due  Topic Date Due   PAP SMEAR-Modifier  10/30/2021   Colonoscopy  Never done   INFLUENZA VACCINE  05/29/2023    Past Medical History:  Diagnosis Date   ADHD (attention deficit  hyperactivity disorder)    Allergic rhinitis, seasonal    Anemia    Anxiety    B12 deficiency 10/31/2021   Cervical dysplasia    high grade   Environmental allergies    Headache    History of gestational diabetes    History of tobacco abuse 04/07/2018   Obsessive-compulsive disorder    PONV (postoperative nausea and vomiting)    Pre-diabetes    Wears glasses     Past Surgical History:  Procedure Laterality Date   COLPOSCOPY N/A 05/12/2018   Procedure: COLPOSCOPY;  Surgeon: Patton Salles, MD;  Location: [facility];  Service: Gynecology;  Laterality: N/A;   LAPAROSCOPIC CHOLECYSTECTOMY  2003   LEEP N/A 05/12/2018   Procedure: LOOP ELECTROSURGICAL EXCISION PROCEDURE (LEEP) with ECC;  Surgeon: Patton Salles, MD;  Location: [facility];  Service: Gynecology;  Laterality: N/A;  with ECC   LEEP     2021   LEEP  2006   RHINOPLASTY  02/2018   TUBAL LIGATION Bilateral 2006    Family History  Problem Relation Age of Onset   Drug abuse Mother        "was a prostitute"   Bipolar disorder Mother    Dementia Maternal Aunt    Cancer Maternal Aunt    Cervical cancer Maternal Aunt    Cancer Maternal  Aunt    ADD / ADHD Maternal Uncle    Stroke Maternal Uncle    Heart attack Maternal Uncle     Social History   Socioeconomic History   Marital status: Media planner    Spouse name: Not on file   Number of children: Not on file   Years of education: Not on file   Highest education level: Bachelor's degree (e.g., BA, AB, BS)  Occupational History   Not on file  Tobacco Use   Smoking status: Former    Current packs/day: 0.50    Average packs/day: 0.5 packs/day for 25.0 years (12.5 ttl pk-yrs)    Types: Cigarettes    Start date: 01/26/2021   Smokeless tobacco: Never  Vaping Use   Vaping status: Every Day  Substance and Sexual Activity   Alcohol use: No   Drug use: No   Sexual activity: Yes    Birth control/protection:  Surgical  Other Topics Concern   Not on file  Social History Narrative   Adopted by her great aunt on her mother's side   Has a half sister with her biological mom   Works as an Engineer, civil (consulting)   Lives with her boyfriend   2003- son [name]   2006- Daughter [name]   Enjoys building furniture/arts and crafts/fishing   Completed bachelor's degree   2 [employer]   Social Determinants of Health   Financial Resource Strain: Low Risk  (06/20/2023)   Overall Financial Resource Strain (CARDIA)    Difficulty of Paying Living Expenses: Not very hard  Food Insecurity: No Food Insecurity (06/20/2023)   Hunger Vital Sign    Worried About Running Out of Food in the Last Year: Never true    Ran Out of Food in the Last Year: Never true  Transportation Needs: No Transportation Needs (06/20/2023)   PRAPARE - Administrator, Civil Service (Medical): No    Lack of Transportation (Non-Medical): No  Physical Activity: Sufficiently Active (06/20/2023)   Exercise Vital Sign    Days of Exercise per Week: 4 days    Minutes of Exercise per Session: 40 min  Stress: Stress Concern Present (06/20/2023)   [employer] of Occupational Health - Occupational Stress Questionnaire    Feeling of Stress : Very much  Social Connections: Unknown (06/20/2023)   Social Connection and Isolation Panel [NHANES]    Frequency of Communication with Friends and Family: More than three times a week    Frequency of Social Gatherings with Friends and Family: Once a week    Attends Religious Services: Patient declined    Database administrator or Organizations: No    Attends Engineer, structural: Not on file    Marital Status: Separated  Intimate Partner Violence: Unknown (01/29/2022)   Received from [employer], [facility]   HITS    Physically Hurt: Not on file    Insult or Talk Down To: Not on file    Threaten Physical Harm: Not on file    Scream or Curse: Not on file     Outpatient Medications Prior to Visit  Medication Sig Dispense Refill   blood glucose meter kit and supplies KIT Dispense based on patient and insurance preference. Use up to four times daily as directed. 1 each 0   Cholecalciferol (VITAMIN D3) 75 MCG (3000 UT) TABS Take 1 tablet by mouth daily. 30 tablet    famotidine (PEPCID) 20 MG tablet Take 1 tablet (20 mg total) by mouth 2 (  two) times daily. 60 tablet 1   fluticasone (FLONASE) 50 MCG/ACT nasal spray Place 1 spray into both nostrils 2 (two) times daily. 16 g 3   folic acid (FOLVITE) 1 MG tablet Take 1 tablet (1 mg total) by mouth daily.     gabapentin (NEURONTIN) 300 MG capsule Take 1 capsule (300 mg total) by mouth at bedtime. 90 capsule 1   ibuprofen (ADVIL) 800 MG tablet Take 1 tablet (800 mg total) by mouth every 8 (eight) hours as needed. 30 tablet 0   L-Tyrosine 1000 MG TABS Take by mouth.     Magnesium Stearate POWD Take 1,000 mg by mouth daily.     ONETOUCH ULTRA test strip USE UP TO FOUR TIMES DAILY AS DIRECTED. 100 strip 2   Plecanatide (TRULANCE) 3 MG TABS Take 1 tablet by mouth daily. 30 tablet 2   SUMAtriptan (IMITREX) 100 MG tablet Take 1 tablet as needed.  May repeat after 2 hours.  Maximum 2 tablets in 24 hours. 10 tablet 5   topiramate (TOPAMAX) 25 MG tablet TAKE 1 TABLET BY MOUTH EVERYDAY AT BEDTIME 90 tablet 1   amphetamine-dextroamphetamine (ADDERALL XR) 25 MG 24 hr capsule Take 1 capsule by mouth every morning. 30 capsule 0   Semaglutide-Weight Management (WEGOVY) 0.25 MG/0.5ML SOAJ Inject 0.25 mg into the skin once a week. 2 mL 0   No facility-administered medications prior to visit.    Allergies  Allergen Reactions   Lexapro [Escitalopram] Hives   Penicillins Hives   Benadryl [Diphenhydramine] Rash   Codeine Itching   Prilosec [Omeprazole]     Severe diarrhea    ROS    See HPI Objective:    Physical Exam Constitutional:      General: She is not in acute distress.    Appearance: Normal  appearance. She is well-developed.  HENT:     Head: Normocephalic and atraumatic.     Right Ear: External ear normal.     Left Ear: External ear normal.  Eyes:     General: No scleral icterus. Neck:     Thyroid: No thyromegaly.  Cardiovascular:     Rate and Rhythm: Normal rate and regular rhythm.     Heart sounds: Normal heart sounds. No murmur heard. Pulmonary:     Effort: Pulmonary effort is normal. No respiratory distress.     Breath sounds: Normal breath sounds. No wheezing.  Musculoskeletal:     Cervical back: Neck supple.  Skin:    General: Skin is warm and dry.  Neurological:     Mental Status: She is alert and oriented to person, place, and time.  Psychiatric:        Mood and Affect: Mood normal.        Behavior: Behavior normal.        Thought Content: Thought content normal.        Judgment: Judgment normal.      BP 122/70 (BP Location: Right Arm, Patient Position: Sitting, Cuff Size: Large)   Pulse 97   Temp 98.4 F (36.9 C) (Oral)   Resp 16   Wt 188 lb (85.3 kg)   SpO2 99%   BMI 32.27 kg/m  Wt Readings from Last 3 Encounters:  06/20/23 188 lb (85.3 kg)  02/28/23 196 lb (88.9 kg)  10/11/22 201 lb 6.4 oz (91.4 kg)       Assessment & Plan:   Problem List Items Addressed This Visit       Unprioritized   IGT (impaired glucose  tolerance)   Relevant Orders   HgB A1c   Cat allergy due to both airborne and skin contact - Primary    New onset of allergic symptoms (itching, congestion, rhinorrhea) since exposure to a new cat in the home. Symptoms are responsive to Claritin. Patient has a history of allergy to Benadryl. -Continue Claritin daily for symptom management. -Recommend removal of the cat from the home if possible. -Consider referral to allergist for further evaluation and potential allergy shots if symptoms persist despite removal of cat or if removal is not possible.       Attention deficit hyperactivity disorder (ADHD)    Continue adderall  xr 25mg  once daily for now.  I think that if we can get her anxiety under better control,her focus will also improve. If not, however we discussed that we could consider increasing her adderall xr dose to 40mg .       Anxiety    Uncontrolled. She had hives with lexapro so will avoid SSRI's.  Will give a trial of buspar bid.       Relevant Medications   busPIRone (BUSPAR) 7.5 MG tablet   Other Visit Diagnoses     Leg cramping       Relevant Orders   Magnesium       I have discontinued Belmira Daley A. Bauder's Wegovy and amphetamine-dextroamphetamine. I am also having her start on busPIRone. Additionally, I am having her maintain her fluticasone, Vitamin D3, folic acid, Trulance, blood glucose meter kit and supplies, OneTouch Ultra, Magnesium Stearate, famotidine, SUMAtriptan, L-Tyrosine, gabapentin, topiramate, and ibuprofen.  Meds ordered this encounter  Medications   busPIRone (BUSPAR) 7.5 MG tablet    Sig: Take 1 tablet (7.5 mg total) by mouth 2 (two) times daily.    Dispense:  60 tablet    Refill:  0    Order Specific Question:   Supervising Provider    Answer:   Danise Edge A [[id number]]

## 2023-06-20 NOTE — Assessment & Plan Note (Signed)
New onset of allergic symptoms (itching, congestion, rhinorrhea) since exposure to a new cat in the home. Symptoms are responsive to Claritin. Patient has a history of allergy to Benadryl. -Continue Claritin daily for symptom management. -Recommend removal of the cat from the home if possible. -Consider referral to allergist for further evaluation and potential allergy shots if symptoms persist despite removal of cat or if removal is not possible.

## 2023-06-20 NOTE — Assessment & Plan Note (Signed)
Uncontrolled. She had hives with lexapro so will avoid SSRI's.  Will give a trial of buspar bid.

## 2023-06-20 NOTE — Assessment & Plan Note (Signed)
Continue adderall xr 25mg  once daily for now.  I think that if we can get her anxiety under better control,her focus will also improve. If not, however we discussed that we could consider increasing her adderall xr dose to 40mg .

## 2023-06-20 NOTE — Patient Instructions (Addendum)
VISIT SUMMARY:  During your visit, we discussed your recent allergic symptoms that started after a cat was introduced into your home. These symptoms include itching, congestion, and drainage. You've been using Claritin, which has helped with the itching, and you also have flonase for nasal congestion, although you haven't needed it recently. You have a known allergy to Benadryl, which causes hives. The presence of the cat is causing you significant distress.  YOUR PLAN:  -CAT ALLERGY: You've developed allergic symptoms since a cat was brought into your home. These symptoms are relieved by Claritin. We recommend continuing Claritin daily to manage these symptoms. If at all possible, it would be best to remove the cat from your home. If your symptoms continue despite removing the cat, or if you can't remove the cat, we may refer you to an allergist for further evaluation and potential allergy shots.  -ANXIETY- please add buspar 7.5mg  twice daily.  INSTRUCTIONS:  Continue taking Claritin daily to manage your symptoms. If possible, try to remove the cat from your home. If your symptoms persist despite removing the cat, or if you can't remove the cat, please contact us for a referral to an allergist.

## 2023-06-21 LAB — HEMOGLOBIN A1C
Hgb A1c MFr Bld: 6.3 %{Hb} — ABNORMAL HIGH (ref <5.7)
Mean Plasma Glucose: 134 mg/dL
eAG (mmol/L): 7.4 mmol/L

## 2023-06-21 LAB — MAGNESIUM: Magnesium: 1.5 mg/dL (ref 1.5–2.5)

## 2023-07-14 ENCOUNTER — Other Ambulatory Visit: Payer: Self-pay | Admitting: Family

## 2023-07-14 ENCOUNTER — Encounter: Payer: Self-pay | Admitting: Family

## 2023-07-14 ENCOUNTER — Other Ambulatory Visit (HOSPITAL_BASED_OUTPATIENT_CLINIC_OR_DEPARTMENT_OTHER): Payer: Self-pay

## 2023-07-14 ENCOUNTER — Other Ambulatory Visit: Payer: Self-pay

## 2023-07-14 DIAGNOSIS — F909 Attention-deficit hyperactivity disorder, unspecified type: Secondary | ICD-10-CM

## 2023-07-14 MED ORDER — BUSPIRONE HCL 7.5 MG PO TABS: 7.5000 mg | ORAL_TABLET | Freq: Two times a day (BID) | ORAL | 0 refills | Status: DC

## 2023-07-14 MED ORDER — AMPHETAMINE-DEXTROAMPHET ER 25 MG PO CP24
25.0000 mg | ORAL_CAPSULE | ORAL | 0 refills | Status: DC
  Filled 2023-07-14 – 2023-07-18 (×2): qty 30, 30d supply, fill #0

## 2023-07-18 ENCOUNTER — Other Ambulatory Visit (HOSPITAL_BASED_OUTPATIENT_CLINIC_OR_DEPARTMENT_OTHER): Payer: Self-pay

## 2023-07-18 ENCOUNTER — Other Ambulatory Visit (HOSPITAL_COMMUNITY)
Admission: RE | Admit: 2023-07-18 | Discharge: 2023-07-18 | Disposition: A | Discharge status: Home / Self Care | Payer: BC Managed Care – PPO | Source: Ambulatory Visit | Attending: Family | Admitting: Family

## 2023-07-18 ENCOUNTER — Ambulatory Visit (INDEPENDENT_AMBULATORY_CARE_PROVIDER_SITE_OTHER): Payer: BC Managed Care – PPO | Admitting: Family

## 2023-07-18 VITALS — BP 124/76 | HR 107 | Temp 98.0°F | Resp 16 | Wt 184.0 lb

## 2023-07-18 DIAGNOSIS — N898 Other specified noninflammatory disorders of vagina: Secondary | ICD-10-CM | POA: Diagnosis not present

## 2023-07-18 DIAGNOSIS — F419 Anxiety disorder, unspecified: Secondary | ICD-10-CM

## 2023-07-18 DIAGNOSIS — F909 Attention-deficit hyperactivity disorder, unspecified type: Secondary | ICD-10-CM | POA: Diagnosis not present

## 2023-07-18 DIAGNOSIS — Z113 Encounter for screening for infections with a predominantly sexual mode of transmission: Secondary | ICD-10-CM | POA: Diagnosis not present

## 2023-07-18 DIAGNOSIS — J3081 Allergic rhinitis due to animal (cat) (dog) hair and dander: Secondary | ICD-10-CM

## 2023-07-18 DIAGNOSIS — L2381 Allergic contact dermatitis due to animal (cat) (dog) dander: Secondary | ICD-10-CM

## 2023-07-18 MED ORDER — AMPHETAMINE-DEXTROAMPHET ER 30 MG PO CP24: 30.0000 mg | ORAL_CAPSULE | ORAL | 0 refills | Status: DC

## 2023-07-18 MED ORDER — AMPHETAMINE-DEXTROAMPHET ER 30 MG PO CP24
30.0000 mg | ORAL_CAPSULE | ORAL | 0 refills | Status: DC
  Filled 2023-07-18 – 2023-08-15 (×3): qty 30, 30d supply, fill #0

## 2023-07-18 NOTE — Assessment & Plan Note (Signed)
Improved with buspar, continue buspar.

## 2023-07-18 NOTE — Progress Notes (Signed)
Subjective:     Patient ID: Barbara Barr, female    DOB: [birth year], 47 y.o.   MRN: [id number]  Chief Complaint  Patient presents with   Vaginal Discharge    Complains of vaginal discharge   Anxiety    Will like to discuss Buspar    Vaginal Discharge The patient's primary symptoms include vaginal discharge.  Anxiety      Discussed the use of AI scribe software for clinical note transcription with the patient, who gave verbal consent to proceed.  History of Present Illness   Barbara Barr, a patient with a history of anxiety and ADHD, presents with a 10-day history of vaginal discharge. Initially, the discharge was thick and white, accompanied by a sensation of irritation rather than itching. The discharge has since become clear but has developed an odor. The patient denies any urinary symptoms and reports that her urinary function is normal. She has a history of concurrent yeast infection, bladder infection, and UTI.  The patient also reports that her anxiety is well-controlled with her current medication, but it seems to be affecting her focus and memory. She is considering increasing her dose of Adderall due to increased work stress and long hours. She has been on Adderall since she was 47 years old and prefers the extended-release formulation.  In addition, the patient requests a full panel of STD screening due to recent relationship issues. 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 She does not believe she needs a herpes type 2 screen as she has not had any related issues.      Wt Readings from Last 3 Encounters:  07/18/23 184 lb (83.5 kg)  06/20/23 188 lb (85.3 kg)  02/28/23 196 lb (88.9 kg)       Health Maintenance Due  Topic Date Due   Colonoscopy  Never done   INFLUENZA VACCINE  05/29/2023    Past Medical History:  Diagnosis Date   ADHD (attention deficit hyperactivity disorder)    Allergic  rhinitis, seasonal    Anemia    Anxiety    B12 deficiency 10/31/2021   Cervical dysplasia    high grade   Environmental allergies    Headache    History of gestational diabetes    History of tobacco abuse 04/07/2018   Obsessive-compulsive disorder    PONV (postoperative nausea and vomiting)    Pre-diabetes    Wears glasses     Past Surgical History:  Procedure Laterality Date   COLPOSCOPY N/A 05/12/2018   Procedure: COLPOSCOPY;  Surgeon: Patton Salles, MD;  Location: [facility];  Service: Gynecology;  Laterality: N/A;   LAPAROSCOPIC CHOLECYSTECTOMY  2003   LEEP N/A 05/12/2018   Procedure: LOOP ELECTROSURGICAL EXCISION PROCEDURE (LEEP) with ECC;  Surgeon: Patton Salles, MD;  Location: [facility];  Service: Gynecology;  Laterality: N/A;  with ECC   LEEP     2021   LEEP  2006   RHINOPLASTY  02/2018   TUBAL LIGATION Bilateral 2006    Family History  Problem Relation Age of Onset   Drug abuse Mother        "was a prostitute"   Bipolar disorder Mother    Dementia Maternal Aunt    Cancer Maternal Aunt    Cervical cancer Maternal Aunt    Cancer Maternal Aunt    ADD / ADHD Maternal Uncle  Stroke Maternal Uncle    Heart attack Maternal Uncle     Social History   Socioeconomic History   Marital status: Media planner    Spouse name: Not on file   Number of children: Not on file   Years of education: Not on file   Highest education level: Bachelor's degree (e.g., BA, AB, BS)  Occupational History   Not on file  Tobacco Use   Smoking status: Former    Current packs/day: 0.50    Average packs/day: 0.5 packs/day for 25.0 years (12.5 ttl pk-yrs)    Types: Cigarettes    Start date: 01/26/2021   Smokeless tobacco: Never  Vaping Use   Vaping status: Every Day  Substance and Sexual Activity   Alcohol use: No   Drug use: No   Sexual activity: Yes    Birth control/protection: Surgical  Other Topics Concern   Not on  file  Social History Narrative   Adopted by her great aunt on her mother's side   Has a half sister with her biological mom   Works as an Engineer, civil (consulting)   Lives with her boyfriend   2003- son [name]   2006- Daughter [name]   Enjoys building furniture/arts and crafts/fishing   Completed bachelor's degree   2 [employer]pher[employer]   Social Determinants of Health   Financial Resource Strain: Low Risk  (06/20/2023)   Overall Financial Resource Strain (CARDIA)    Difficulty of Paying Living Expenses: Not very hard  Food Insecurity: No Food Insecurity (06/20/2023)   Hunger Vital Sign    Worried About Running Out of Food in the Last Year: Never true    Ran Out of Food in the Last Year: Never true  Transportation Needs: No Transportation Needs (06/20/2023)   PRAPARE - Administrator, Civil Service (Medical): No    Lack of Transportation (Non-Medical): No  Physical Activity: Sufficiently Active (06/20/2023)   Exercise Vital Sign    Days of Exercise per Week: 4 days    Minutes of Exercise per Session: 40 min  Stress: Stress Concern Present (06/20/2023)   [employer] of Occupational Health - Occupational Stress Questionnaire    Feeling of Stress : Very much  Social Connections: Unknown (06/20/2023)   Social Connection and Isolation Panel [NHANES]    Frequency of Communication with Friends and Family: More than three times a week    Frequency of Social Gatherings with Friends and Family: Once a week    Attends Religious Services: Patient declined    Database administrator or Organizations: No    Attends Engineer, structural: Not on file    Marital Status: Separated  Intimate Partner Violence: Unknown (01/29/2022)   Received from [employer], [facility]   HITS    Physically Hurt: Not on file    Insult or Talk Down To: Not on file    Threaten Physical Harm: Not on file    Scream or Curse: Not on file    Outpatient Medications Prior to Visit  Medication  Sig Dispense Refill   busPIRone (BUSPAR) 7.5 MG tablet Take 1 tablet (7.5 mg total) by mouth 2 (two) times daily. 60 tablet 0   Cholecalciferol (VITAMIN D3) 75 MCG (3000 UT) TABS Take 1 tablet by mouth daily. 30 tablet    famotidine (PEPCID) 20 MG tablet Take 1 tablet (20 mg total) by mouth 2 (two) times daily. 60 tablet 1   fluticasone (FLONASE) 50 MCG/ACT nasal spray Place 1 spray  into both nostrils 2 (two) times daily. 16 g 3   folic acid (FOLVITE) 1 MG tablet Take 1 tablet (1 mg total) by mouth daily.     gabapentin (NEURONTIN) 300 MG capsule Take 1 capsule (300 mg total) by mouth at bedtime. 90 capsule 1   ibuprofen (ADVIL) 800 MG tablet Take 1 tablet (800 mg total) by mouth every 8 (eight) hours as needed. 30 tablet 0   L-Tyrosine 1000 MG TABS Take by mouth.     Magnesium Stearate POWD Take 1,000 mg by mouth daily.     Plecanatide (TRULANCE) 3 MG TABS Take 1 tablet by mouth daily. 30 tablet 2   SUMAtriptan (IMITREX) 100 MG tablet Take 1 tablet as needed.  May repeat after 2 hours.  Maximum 2 tablets in 24 hours. 10 tablet 5   topiramate (TOPAMAX) 25 MG tablet TAKE 1 TABLET BY MOUTH EVERYDAY AT BEDTIME 90 tablet 1   amphetamine-dextroamphetamine (ADDERALL XR) 25 MG 24 hr capsule Take 1 capsule by mouth every morning. 30 capsule 0   blood glucose meter kit and supplies KIT Dispense based on patient and insurance preference. Use up to four times daily as directed. 1 each 0   ONETOUCH ULTRA test strip USE UP TO FOUR TIMES DAILY AS DIRECTED. 100 strip 2   No facility-administered medications prior to visit.    Allergies  Allergen Reactions   Lexapro [Escitalopram] Hives   Penicillins Hives   Benadryl [Diphenhydramine] Rash   Codeine Itching   Prilosec [Omeprazole]     Severe diarrhea    Review of Systems  Genitourinary:  Positive for vaginal discharge.       Objective:    Physical Exam Exam conducted with a chaperone present.  Constitutional:      General: She is not in acute  distress.    Appearance: Normal appearance. She is well-developed.  HENT:     Head: Normocephalic and atraumatic.     Right Ear: External ear normal.     Left Ear: External ear normal.  Eyes:     General: No scleral icterus. Neck:     Thyroid: No thyromegaly.  Cardiovascular:     Rate and Rhythm: Normal rate and regular rhythm.     Heart sounds: Normal heart sounds. No murmur heard. Pulmonary:     Effort: Pulmonary effort is normal. No respiratory distress.     Breath sounds: Normal breath sounds. No wheezing.  Genitourinary:    Labia:        Right: No rash.        Left: No rash.      Comments: Thin white discharge noted Musculoskeletal:     Cervical back: Neck supple.  Skin:    General: Skin is warm and dry.  Neurological:     Mental Status: She is alert and oriented to person, place, and time.  Psychiatric:        Mood and Affect: Mood normal.        Behavior: Behavior normal.        Thought Content: Thought content normal.        Judgment: Judgment normal.      BP 124/76 (BP Location: Right Arm, Patient Position: Sitting, Cuff Size: Small)   Pulse (!) 107   Temp 98 F (36.7 C) (Oral)   Resp 16   Wt 184 lb (83.5 kg)   SpO2 100%   BMI 31.58 kg/m  Wt Readings from Last 3 Encounters:  07/18/23 184 lb (83.5 kg)  06/20/23 188 lb (85.3 kg)  02/28/23 196 lb (88.9 kg)       Assessment & Plan:   Problem List Items Addressed This Visit       Unprioritized   Vaginal discharge - Primary    New. Will send swab for BV/Yeast, trichomonas, GC/Chlamydia.       Relevant Orders   Urine cytology ancillary only([facility])   Screening examination for STD (sexually transmitted disease)    Obtain HIV and RPR. 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       Attention deficit hyperactivity disorder (ADHD)     Reports increased work stress  and difficulty focusing. Currently on Adderall 25mg  extended release. -Increase Adderall to 30mg  extended release at next refill.       Anxiety    Improved with buspar, continue buspar.        I have discontinued Aasiya Creasey A. Bambach's blood glucose meter kit and supplies and OneTouch Ultra. I am also having her maintain her fluticasone, Vitamin D3, folic acid, Trulance, Magnesium Stearate, famotidine, SUMAtriptan, L-Tyrosine, gabapentin, topiramate, ibuprofen, busPIRone, and amphetamine-dextroamphetamine.  Meds ordered this encounter  Medications   DISCONTD: amphetamine-dextroamphetamine (ADDERALL XR) 30 MG 24 hr capsule    Sig: Take 1 capsule (30 mg total) by mouth every morning.    Dispense:  30 capsule    Refill:  0    Order Specific Question:   Supervising Provider    Answer:   Danise Edge A [[id number]]   amphetamine-dextroamphetamine (ADDERALL XR) 30 MG 24 hr capsule    Sig: Take 1 capsule (30 mg total) by mouth every morning.    Dispense:  30 capsule    Refill:  0    Order Specific Question:   Supervising Provider    Answer:   Danise Edge A [[id number]]

## 2023-07-18 NOTE — Assessment & Plan Note (Signed)
Obtain HIV and RPR. Declines HSV

## 2023-07-18 NOTE — Assessment & Plan Note (Signed)
She is doing better using an antihistamine.

## 2023-07-18 NOTE — Assessment & Plan Note (Signed)
New. Will send swab for BV/Yeast, trichomonas, GC/Chlamydia.

## 2023-07-18 NOTE — Assessment & Plan Note (Signed)
  Reports increased work stress and difficulty focusing. Currently on Adderall 25mg  extended release. -Increase Adderall to 30mg  extended release at next refill.

## 2023-07-18 NOTE — Patient Instructions (Signed)
VISIT SUMMARY:  Dear Efraim Kaufmann, during your visit, we discussed your concerns about vaginal discharge, anxiety, and ADHD. We also discussed your request for a full panel of STD screening. Here is a summary of our discussion and the next steps.  YOUR PLAN:  -VAGINAL DISCHARGE: You've been experiencing vaginal discharge that has changed in color and odor over the past 10 days. This could be due to a yeast infection or bacterial vaginosis, which are common infections in women. We will perform a full STD panel, including tests for bacterial vaginosis, gonorrhea, and chlamydia.   -ANXIETY: You mentioned that your current anxiety medication is effective in reducing anxiety but may be impacting your focus and memory. For now, we will continue with your current anxiety medication.  -ADHD: Elvera Bicker been experiencing increased work stress and difficulty focusing. To help with this, we will increase your Adderall dosage to 30mg  extended release at your next refill. This medication helps improve focus and attention in people with ADHD.  -GENERAL HEALTH MAINTENANCE: As part of your general health maintenance, we will perform HIV and syphilis screening. We will also continue monitoring for herpes type 1 symptoms. You've chosen not to screen for herpes type 2, which is your decision to make.

## 2023-07-20 ENCOUNTER — Encounter: Payer: Self-pay | Admitting: Family

## 2023-07-21 ENCOUNTER — Telehealth: Payer: Self-pay | Admitting: Family

## 2023-07-21 LAB — URINE CYTOLOGY ANCILLARY ONLY
Bacterial Vaginitis (gardnerella): NEGATIVE
Candida Glabrata: NEGATIVE
Candida Vaginitis: NEGATIVE
Chlamydia: NEGATIVE
Comment: NEGATIVE
Comment: NEGATIVE
Comment: NEGATIVE
Comment: NEGATIVE
Comment: NEGATIVE
Comment: NORMAL
Neisseria Gonorrhea: NEGATIVE
Trichomonas: POSITIVE — AB

## 2023-07-21 MED ORDER — METRONIDAZOLE 500 MG PO TABS: 500.0000 mg | ORAL_TABLET | Freq: Two times a day (BID) | ORAL | 0 refills | Status: DC

## 2023-07-21 MED ORDER — FLUCONAZOLE 150 MG PO TABS: ORAL_TABLET | ORAL | 0 refills | Status: DC

## 2023-07-21 NOTE — Telephone Encounter (Signed)
See mychart.  

## 2023-07-22 LAB — RPR: RPR Ser Ql: NONREACTIVE

## 2023-07-22 LAB — HIV ANTIBODY (ROUTINE TESTING W REFLEX): HIV 1&2 Ab, 4th Generation: NONREACTIVE

## 2023-07-24 ENCOUNTER — Other Ambulatory Visit: Payer: Self-pay | Admitting: Family

## 2023-07-24 MED ORDER — ONDANSETRON HCL 4 MG PO TABS: 4.0000 mg | ORAL_TABLET | Freq: Three times a day (TID) | ORAL | 0 refills | Status: AC | PRN

## 2023-07-24 MED ORDER — TINIDAZOLE 500 MG PO TABS: 2.0000 g | ORAL_TABLET | Freq: Once | ORAL | 0 refills | Status: AC

## 2023-08-07 ENCOUNTER — Other Ambulatory Visit: Payer: Self-pay | Admitting: Family

## 2023-08-07 ENCOUNTER — Encounter (HOSPITAL_BASED_OUTPATIENT_CLINIC_OR_DEPARTMENT_OTHER): Payer: Self-pay | Admitting: Pharmacist

## 2023-08-07 ENCOUNTER — Other Ambulatory Visit: Payer: Self-pay

## 2023-08-07 ENCOUNTER — Encounter: Payer: Self-pay | Admitting: Family

## 2023-08-07 ENCOUNTER — Other Ambulatory Visit (HOSPITAL_BASED_OUTPATIENT_CLINIC_OR_DEPARTMENT_OTHER): Payer: Self-pay

## 2023-08-07 DIAGNOSIS — R232 Flushing: Secondary | ICD-10-CM

## 2023-08-07 DIAGNOSIS — R09A2 Foreign body sensation, throat: Secondary | ICD-10-CM

## 2023-08-07 MED ORDER — BUSPIRONE HCL 7.5 MG PO TABS
7.5000 mg | ORAL_TABLET | Freq: Two times a day (BID) | ORAL | 1 refills | Status: DC
  Filled 2023-08-07: qty 180, 90d supply, fill #0

## 2023-08-07 MED ORDER — IBUPROFEN 800 MG PO TABS
800.0000 mg | ORAL_TABLET | Freq: Three times a day (TID) | ORAL | 0 refills | Status: DC | PRN
  Filled 2023-08-07 (×2): qty 30, 10d supply, fill #0

## 2023-08-08 ENCOUNTER — Other Ambulatory Visit: Payer: Self-pay | Admitting: Neurology

## 2023-08-08 ENCOUNTER — Other Ambulatory Visit: Payer: Self-pay

## 2023-08-08 ENCOUNTER — Other Ambulatory Visit (HOSPITAL_BASED_OUTPATIENT_CLINIC_OR_DEPARTMENT_OTHER): Payer: Self-pay

## 2023-08-08 MED ORDER — BUSPIRONE HCL 7.5 MG PO TABS: 7.5000 mg | ORAL_TABLET | Freq: Two times a day (BID) | ORAL | 1 refills | Status: DC

## 2023-08-08 MED ORDER — IBUPROFEN 800 MG PO TABS: 800.0000 mg | ORAL_TABLET | Freq: Three times a day (TID) | ORAL | 0 refills | Status: DC | PRN

## 2023-08-08 MED ORDER — GABAPENTIN 300 MG PO CAPS
300.0000 mg | ORAL_CAPSULE | Freq: Every day | ORAL | 1 refills | Status: DC
Start: 2023-08-08 — End: 2024-08-30

## 2023-08-08 MED ORDER — FAMOTIDINE 20 MG PO TABS
20.0000 mg | ORAL_TABLET | Freq: Two times a day (BID) | ORAL | 1 refills | Status: DC
Start: 2023-08-08 — End: 2024-06-18

## 2023-08-13 ENCOUNTER — Other Ambulatory Visit (HOSPITAL_BASED_OUTPATIENT_CLINIC_OR_DEPARTMENT_OTHER): Payer: Self-pay

## 2023-08-15 ENCOUNTER — Other Ambulatory Visit (HOSPITAL_BASED_OUTPATIENT_CLINIC_OR_DEPARTMENT_OTHER): Payer: Self-pay

## 2023-08-15 ENCOUNTER — Encounter (INDEPENDENT_AMBULATORY_CARE_PROVIDER_SITE_OTHER): Payer: BC Managed Care – PPO | Admitting: Family

## 2023-08-15 DIAGNOSIS — N3000 Acute cystitis without hematuria: Secondary | ICD-10-CM | POA: Diagnosis not present

## 2023-08-15 MED ORDER — NITROFURANTOIN MONOHYD MACRO 100 MG PO CAPS: 100.0000 mg | ORAL_CAPSULE | Freq: Two times a day (BID) | ORAL | 0 refills | Status: DC

## 2023-08-15 NOTE — Telephone Encounter (Signed)
Please see the MyChart message reply(ies) for my assessment and plan.  The patient gave consent for this Medical Advice Message and is aware that it may result in a bill to their insurance company as well as the possibility that this may result in a co-payment or deductible. They are an established patient, but are not seeking medical advice exclusively about a problem treated during an in person or video visit in the last 7 days. I did not recommend an in person or video visit within 7 days of my reply.  I spent a total of 5 minutes cumulative provider time within 7 days through [employer].  Nance Pear, NP

## 2023-08-19 ENCOUNTER — Ambulatory Visit: Payer: BC Managed Care – PPO | Admitting: Family

## 2023-08-21 ENCOUNTER — Other Ambulatory Visit (HOSPITAL_BASED_OUTPATIENT_CLINIC_OR_DEPARTMENT_OTHER): Payer: Self-pay

## 2023-09-05 ENCOUNTER — Ambulatory Visit: Payer: BC Managed Care – PPO | Admitting: Family

## 2023-10-07 ENCOUNTER — Telehealth (INDEPENDENT_AMBULATORY_CARE_PROVIDER_SITE_OTHER): Payer: BC Managed Care – PPO | Admitting: Family

## 2023-10-07 DIAGNOSIS — S8991XA Unspecified injury of right lower leg, initial encounter: Secondary | ICD-10-CM | POA: Diagnosis not present

## 2023-10-07 DIAGNOSIS — F909 Attention-deficit hyperactivity disorder, unspecified type: Secondary | ICD-10-CM

## 2023-10-07 MED ORDER — AMPHETAMINE-DEXTROAMPHET ER 30 MG PO CP24
30.0000 mg | ORAL_CAPSULE | ORAL | 0 refills | Status: DC
Start: 2023-10-07 — End: 2023-11-18

## 2023-10-07 MED ORDER — IBUPROFEN 800 MG PO TABS: 800.0000 mg | ORAL_TABLET | Freq: Three times a day (TID) | ORAL | 0 refills | Status: DC | PRN

## 2023-10-07 NOTE — Progress Notes (Signed)
MyChart Video Visit    Virtual Visit via Video Note    Patient location: Home. Patient and provider in visit Provider location: Office  I discussed the limitations of evaluation and management by telemedicine and the availability of in person appointments. The patient expressed understanding and agreed to proceed.  Visit Date: 10/07/2023  Today's healthcare provider: Lemont Fillers, NP     Subjective:    Patient ID: Barbara Barr, female    DOB: 1975-11-27, 47 y.o.   MRN: [id number]  Chief Complaint  Patient presents with   Knee Pain    Complains of right knee pain after falling    HPI  The patient presents with right knee pain and swelling after two recent falls. Initially, she slipped on ice and sprained her knee, which was managed with a knee brace. However, the pain worsened after stepping in a hole a few days later. She describes the pain as radiating from the knee to the foot, especially when walking or applying pressure. The knee is swollen, 'like the size of a large grapefruit,' and there is an "indent" in the back of her knee which concerns her about the possibility of ligament damage. She also reports swelling in the right foot. The patient has been managing the pain with ice and 800mg  ibuprofen, but still notes throbbing in the knee. She has a history of meniscus tear in the same knee and suspects a similar injury.  Past Medical History:  Diagnosis Date   ADHD (attention deficit hyperactivity disorder)    Allergic rhinitis, seasonal    Anemia    Anxiety    B12 deficiency 10/31/2021   Cervical dysplasia    high grade   Environmental allergies    Headache    History of gestational diabetes    History of tobacco abuse 04/07/2018   Obsessive-compulsive disorder    PONV (postoperative nausea and vomiting)    Pre-diabetes    Wears glasses     Past Surgical History:  Procedure Laterality Date   COLPOSCOPY N/A 05/12/2018   Procedure: COLPOSCOPY;   Surgeon: Patton Salles, MD;  Location: [facility];  Service: Gynecology;  Laterality: N/A;   LAPAROSCOPIC CHOLECYSTECTOMY  2003   LEEP N/A 05/12/2018   Procedure: LOOP ELECTROSURGICAL EXCISION PROCEDURE (LEEP) with ECC;  Surgeon: Patton Salles, MD;  Location: [facility];  Service: Gynecology;  Laterality: N/A;  with ECC   LEEP     2021   LEEP  2006   RHINOPLASTY  02/2018   TUBAL LIGATION Bilateral 2006    Family History  Problem Relation Age of Onset   Drug abuse Mother        "was a prostitute"   Bipolar disorder Mother    Dementia Maternal Aunt    Cancer Maternal Aunt    Cervical cancer Maternal Aunt    Cancer Maternal Aunt    ADD / ADHD Maternal Uncle    Stroke Maternal Uncle    Heart attack Maternal Uncle     Social History   Socioeconomic History   Marital status: Media planner    Spouse name: Not on file   Number of children: Not on file   Years of education: Not on file   Highest education level: Bachelor's degree (e.g., BA, AB, BS)  Occupational History   Not on file  Tobacco Use   Smoking status: Former    Current packs/day: 0.50    Average  packs/day: 0.5 packs/day for 25.0 years (12.5 ttl pk-yrs)    Types: Cigarettes    Start date: 01/26/2021   Smokeless tobacco: Never  Vaping Use   Vaping status: Every Day  Substance and Sexual Activity   Alcohol use: No   Drug use: No   Sexual activity: Yes    Birth control/protection: Surgical  Other Topics Concern   Not on file  Social History Narrative   Adopted by her great aunt on her mother's side   Has a half sister with her biological mom   Works as an Engineer, civil (consulting)   Lives with her boyfriend   2003- son [name]   2006- Daughter [name]   Enjoys building furniture/arts and crafts/fishing   Completed bachelor's degree   2 german shepherds   Social Drivers of Corporate investment banker Strain: Low Risk  (06/20/2023)   Overall Financial  Resource Strain (CARDIA)    Difficulty of Paying Living Expenses: Not very hard  Food Insecurity: No Food Insecurity (06/20/2023)   Hunger Vital Sign    Worried About Running Out of Food in the Last Year: Never true    Ran Out of Food in the Last Year: Never true  Transportation Needs: No Transportation Needs (06/20/2023)   PRAPARE - Administrator, Civil Service (Medical): No    Lack of Transportation (Non-Medical): No  Physical Activity: Sufficiently Active (06/20/2023)   Exercise Vital Sign    Days of Exercise per Week: 4 days    Minutes of Exercise per Session: 40 min  Stress: Stress Concern Present (06/20/2023)   [employer] of Occupational Health - Occupational Stress Questionnaire    Feeling of Stress : Very much  Social Connections: Unknown (06/20/2023)   Social Connection and Isolation Panel [NHANES]    Frequency of Communication with Friends and Family: More than three times a week    Frequency of Social Gatherings with Friends and Family: Once a week    Attends Religious Services: Patient declined    Database administrator or Organizations: No    Attends Engineer, structural: Not on file    Marital Status: Separated  Intimate Partner Violence: Unknown (01/29/2022)   Received from [employer], [facility]   HITS    Physically Hurt: Not on file    Insult or Talk Down To: Not on file    Threaten Physical Harm: Not on file    Scream or Curse: Not on file    Outpatient Medications Prior to Visit  Medication Sig Dispense Refill   busPIRone (BUSPAR) 7.5 MG tablet Take 1 tablet (7.5 mg total) by mouth 2 (two) times daily. 180 tablet 1   Cholecalciferol (VITAMIN D3) 75 MCG (3000 UT) TABS Take 1 tablet by mouth daily. 30 tablet    famotidine (PEPCID) 20 MG tablet Take 1 tablet (20 mg total) by mouth 2 (two) times daily. 60 tablet 1   fluconazole (DIFLUCAN) 150 MG tablet One tab by mouth once today, my repeat in 3 days if symptoms persist 2 tablet 0    fluticasone (FLONASE) 50 MCG/ACT nasal spray Place 1 spray into both nostrils 2 (two) times daily. 16 g 3   folic acid (FOLVITE) 1 MG tablet Take 1 tablet (1 mg total) by mouth daily.     gabapentin (NEURONTIN) 300 MG capsule Take 1 capsule (300 mg total) by mouth at bedtime. 90 capsule 1   L-Tyrosine 1000 MG TABS Take by mouth.     Magnesium  Stearate POWD Take 1,000 mg by mouth daily.     nitrofurantoin, macrocrystal-monohydrate, (MACROBID) 100 MG capsule Take 1 capsule (100 mg total) by mouth 2 (two) times daily. 10 capsule 0   ondansetron (ZOFRAN) 4 MG tablet Take 1 tablet (4 mg total) by mouth every 8 (eight) hours as needed for nausea or vomiting. 20 tablet 0   Plecanatide (TRULANCE) 3 MG TABS Take 1 tablet by mouth daily. 30 tablet 2   SUMAtriptan (IMITREX) 100 MG tablet Take 1 tablet as needed.  May repeat after 2 hours.  Maximum 2 tablets in 24 hours. 10 tablet 5   topiramate (TOPAMAX) 25 MG tablet TAKE 1 TABLET BY MOUTH EVERYDAY AT BEDTIME 90 tablet 1   amphetamine-dextroamphetamine (ADDERALL XR) 30 MG 24 hr capsule Take 1 capsule (30 mg total) by mouth every morning. 30 capsule 0   ibuprofen (ADVIL) 800 MG tablet Take 1 tablet (800 mg total) by mouth every 8 (eight) hours as needed. 30 tablet 0   No facility-administered medications prior to visit.    Allergies  Allergen Reactions   Lexapro [Escitalopram] Hives   Penicillins Hives   Benadryl [Diphenhydramine] Rash   Codeine Itching   Prilosec [Omeprazole]     Severe diarrhea    ROS See HPI    Objective:    Physical Exam  There were no vitals taken for this visit. Wt Readings from Last 3 Encounters:  10/08/23 184 lb (83.5 kg)  07/18/23 184 lb (83.5 kg)  06/20/23 188 lb (85.3 kg)   Gen: Awake, alert, no acute distress Resp: Breathing is even and non-labored Psych: calm/pleasant demeanor Neuro: Alert and Oriented x 3, + facial symmetry, speech is clear. MS: Knee not able to be examined on video      Assessment &  Plan:   Problem List Items Addressed This Visit       Unprioritized   Injury of right knee - Primary   Acute pain and swelling following a fall on ice and subsequent misstep into a hole. History of previous meniscus tear in the same knee. Pain radiating to foot. -Order knee x-ray to rule out fracture. -Refer to sports medicine for further evaluation and potential MRI. -Continue use of knee brace. -Continue icing and elevate leg. -Refill Ibuprofen 800mg  for pain management.      Relevant Orders   DG Knee 1-2 Views Right (Completed)   Ambulatory referral to Sports Medicine   Attention deficit hyperactivity disorder (ADHD)    Reports current medication regimen is effective. -Refill Adderall prescription, [facility]       Relevant Medications   amphetamine-dextroamphetamine (ADDERALL XR) 30 MG 24 hr capsule    I am having Zailah Zagami A. Wexler maintain her fluticasone, Vitamin D3, folic acid, Trulance, Magnesium Stearate, SUMAtriptan, L-Tyrosine, fluconazole, ondansetron, busPIRone, famotidine, gabapentin, topiramate, nitrofurantoin (macrocrystal-monohydrate), ibuprofen, and amphetamine-dextroamphetamine.  Meds ordered this encounter  Medications   ibuprofen (ADVIL) 800 MG tablet    Sig: Take 1 tablet (800 mg total) by mouth every 8 (eight) hours as needed.    Dispense:  30 tablet    Refill:  0    Supervising Provider:   Danise Edge A [[id number]]   amphetamine-dextroamphetamine (ADDERALL XR) 30 MG 24 hr capsule    Sig: Take 1 capsule (30 mg total) by mouth every morning.    Dispense:  30 capsule    Refill:  0    Supervising Provider:   Danise Edge A [[id number]]    I discussed  the assessment and treatment plan with the patient. The patient was provided an opportunity to ask questions and all were answered. The patient agreed with the plan and demonstrated an understanding of the instructions.   The patient was advised to call back or seek an in-person evaluation if the  symptoms worsen or if the condition fails to improve as anticipated.  Lemont Fillers, NP [facility] [phone] (phone) [phone] (fax)  [facility]

## 2023-10-08 ENCOUNTER — Ambulatory Visit (INDEPENDENT_AMBULATORY_CARE_PROVIDER_SITE_OTHER): Payer: BC Managed Care – PPO | Admitting: Family Medicine

## 2023-10-08 ENCOUNTER — Other Ambulatory Visit: Payer: Self-pay

## 2023-10-08 ENCOUNTER — Encounter: Payer: Self-pay | Admitting: Family Medicine

## 2023-10-08 ENCOUNTER — Ambulatory Visit (HOSPITAL_BASED_OUTPATIENT_CLINIC_OR_DEPARTMENT_OTHER)
Admission: RE | Admit: 2023-10-08 | Discharge: 2023-10-08 | Disposition: A | Discharge status: Home / Self Care | Payer: BC Managed Care – PPO | Source: Ambulatory Visit | Attending: Family | Admitting: Family

## 2023-10-08 VITALS — BP 120/78 | Ht 64.0 in | Wt 184.0 lb

## 2023-10-08 DIAGNOSIS — M25561 Pain in right knee: Secondary | ICD-10-CM

## 2023-10-08 DIAGNOSIS — S8991XA Unspecified injury of right lower leg, initial encounter: Secondary | ICD-10-CM | POA: Diagnosis not present

## 2023-10-08 DIAGNOSIS — M25461 Effusion, right knee: Secondary | ICD-10-CM | POA: Diagnosis not present

## 2023-10-08 NOTE — Progress Notes (Unsigned)
CHIEF COMPLAINT: No chief complaint on file.  _____________________________________________________________ SUBJECTIVE  HPI  Pt is a 47 y.o. female here for evaluation of acute R knee pain  Last week when it snowed, got out of her truck in the driveway and slipped on the ice and went down, doesn't remember how she fell. Hurt her shoulder which is better, but knee is not. Swelling has not gone down. 5 days ago stepped into a hole and her knee twisted and popped and she felt pain. Feeling a pulse in her knee. Swelling from the middle of her thigh down to her foot  No numbness/tingling.  Pain with walking, stopping too quickly, reports some possible catching/locking. Bending knee also hurts Infrapatellar and medial knee pain +tightness behind the knee  Therapies tried: knee bracing, ibuprofen 800mg , ice  Hx injury to this knee, tore a ligament. Will occasionally swell, but last time this happened prior to this injury was in 2022 ------------------------------------------------------------------------------------------------------ Past Medical History:  Diagnosis Date   ADHD (attention deficit hyperactivity disorder)    Allergic rhinitis, seasonal    Anemia    Anxiety    B12 deficiency 10/31/2021   Cervical dysplasia    high grade   Environmental allergies    Headache    History of gestational diabetes    History of tobacco abuse 04/07/2018   Obsessive-compulsive disorder    PONV (postoperative nausea and vomiting)    Pre-diabetes    Wears glasses     Past Surgical History:  Procedure Laterality Date   COLPOSCOPY N/A 05/12/2018   Procedure: COLPOSCOPY;  Surgeon: Patton Salles, MD;  Location[facility];  Service: Gynecology;  Laterality: N/A;   LAPAROSCOPIC CHOLECYSTECTOMY  2003   LEEP N/A 05/12/2018   Procedure: LOOP ELECTROSURGICAL EXCISION PROCEDURE (LEEP) with ECC;  Surgeon: Patton Salles, MD;  Location: [facility];   Service: Gynecology;  Laterality: N/A;  with ECC   LEEP     2021   LEEP  2006   RHINOPLASTY  02/2018   TUBAL LIGATION Bilateral 2006      Outpatient Encounter Medications as of 10/08/2023  Medication Sig Note   amphetamine-dextroamphetamine (ADDERALL XR) 30 MG 24 hr capsule Take 1 capsule (30 mg total) by mouth every morning.    busPIRone (BUSPAR) 7.5 MG tablet Take 1 tablet (7.5 mg total) by mouth 2 (two) times daily.    Cholecalciferol (VITAMIN D3) 75 MCG (3000 UT) TABS Take 1 tablet by mouth daily.    famotidine (PEPCID) 20 MG tablet Take 1 tablet (20 mg total) by mouth 2 (two) times daily.    fluconazole (DIFLUCAN) 150 MG tablet One tab by mouth once today, my repeat in 3 days if symptoms persist    fluticasone (FLONASE) 50 MCG/ACT nasal spray Place 1 spray into both nostrils 2 (two) times daily.    folic acid (FOLVITE) 1 MG tablet Take 1 tablet (1 mg total) by mouth daily.    gabapentin (NEURONTIN) 300 MG capsule Take 1 capsule (300 mg total) by mouth at bedtime.    ibuprofen (ADVIL) 800 MG tablet Take 1 tablet (800 mg total) by mouth every 8 (eight) hours as needed.    L-Tyrosine 1000 MG TABS Take by mouth.    Magnesium Stearate POWD Take 1,000 mg by mouth daily.    nitrofurantoin, macrocrystal-monohydrate, (MACROBID) 100 MG capsule Take 1 capsule (100 mg total) by mouth 2 (two) times daily.    ondansetron (ZOFRAN) 4 MG tablet  Take 1 tablet (4 mg total) by mouth every 8 (eight) hours as needed for nausea or vomiting.    Plecanatide (TRULANCE) 3 MG TABS Take 1 tablet by mouth daily. 08/23/2022: As needed   SUMAtriptan (IMITREX) 100 MG tablet Take 1 tablet as needed.  May repeat after 2 hours.  Maximum 2 tablets in 24 hours.    topiramate (TOPAMAX) 25 MG tablet TAKE 1 TABLET BY MOUTH EVERYDAY AT BEDTIME    No facility-administered encounter medications on file as of 10/08/2023.     ------------------------------------------------------------------------------------------------------  _____________________________________________________________ OBJECTIVE  PHYSICAL EXAM  Today's Vitals   10/08/23 0835  BP: 120/78  Weight: 184 lb (83.5 kg)  Height: 5\' 4"  (1.626 m)   Body mass index is 31.58 kg/m.   reviewed  General: A+Ox3, no acute distress, well-nourished, appropriate affect CV: pulses 2+ regular, nondiaphoretic, no peripheral edema, cap refill <2sec Lungs: no audible wheezing, non-labored breathing, bilateral chest rise/fall, nontachypneic Skin: warm, well-perfused, non-icteric, no susp lesions or rashes Neuro: no focal deficits. Sensation intact, muscle tone wnl, no atrophy Psych: no signs of depression or anxiety MSK:   R Knee: Mild visible swelling R knee compared to L + joint milking Limited flexion 95 degrees, reporting pressure and pain. Extension 5 TTP medial jt line, medial fem condyle, patella, discomfort with palpation over patellar tendon, infrapatellar space, some discomfort palpating posterior fossa NTTP over the quad tendon, lat fem condyle, plica, tibial tuberosity, fibular head, pes anserine bursa, gerdy's tubercle, lateral jt line Neg sag sign, posterior drawer Mild laxity with anterior and lachman, test complicated by involuntary muscle contraction, neg lelli test Negative varus stress Equivocal valgus test d/t severe pain with valgus stress and involuntary muscle contraction Unable to perform mcmurray due to pain severity Equivocal [place] with anteromedial pain Gait antalgic  POCUS performed, demonstrating mild to moderate intraarticular knee effusion, quad tendon intact. Signs of distal patellar fibrillar disruption and intratendinous hypoechogenic changes, hypoechoic changes within hoffa's fat pad, deep tendon anechoic changes and irregular contour of patellar tendon without vascularization.   XR knee ordered by PCP,  imaged today, independently reviewed. Preserved knee joint spacing.  Mild osseous changes lateral proximal tibia.  Small medial osteophytic changes distal femur and proximal tibia.  Presence of superior and inferior patellar enthesopathy with some distal enthesophyte opacity irregularities however with no correlated superficial swelling, signs of suprapatellar soft tissue swelling _____________________________________________________________ ASSESSMENT/PLAN Diagnoses and all orders for this visit:  Acute pain of right knee -     [place] LIMITED JOINT SPACE STRUCTURES LOW RIGHT; Future -     MR Knee Right Wo Contrast; Future  Knee effusion, right -     MR Knee Right Wo Contrast; Future   Sustained two separate traumatic events to the R knee with persistent swelling x 7 days, presence of intraarticular effusion, notable difficulty bearing weight. POCUS and XR results reviewed with patient. R knee MRI ordered. Follow-up pending results. All questions answered. Return precautions discussed. Patient verbalized understanding and is in agreement with plan   Electronically signed by: Burna Forts, MD 10/08/2023 8:57 AM

## 2023-10-12 DIAGNOSIS — S8991XA Unspecified injury of right lower leg, initial encounter: Secondary | ICD-10-CM | POA: Insufficient documentation

## 2023-10-12 NOTE — Patient Instructions (Signed)
VISIT SUMMARY:  You came in today because of pain and swelling in your right knee after two recent falls. You initially slipped on ice and sprained your knee, and then the pain worsened after stepping in a hole a few days later. You described the pain as radiating from your knee to your foot, especially when walking or applying pressure. Your knee is swollen, and you also reported swelling in your foot. You have been managing the pain with ice and ibuprofen. You have a history of a meniscus tear in the same knee and suspect a similar injury.  YOUR PLAN:  -RIGHT KNEE INJURY: You have acute pain and swelling in your right knee following a fall on ice and a subsequent misstep into a hole. Given your history of a previous meniscus tear in the same knee, we need to rule out a fracture and further evaluate the injury. We will order a knee x-ray and refer you to sports medicine for a potential MRI. Continue using the knee brace, icing the knee, and elevating your leg. We have also refilled your prescription for 800mg  ibuprofen to help manage the pain.  -ADHD: Your current medication regimen for ADHD is effective. We have refilled your Adderall prescription, which [facility].  INSTRUCTIONS:  Please await the results of your knee x-ray and the consultation with sports medicine. Continue with your current management plan, including using the knee brace, icing, and elevating your leg. Your next appointment is scheduled for next Friday.

## 2023-10-12 NOTE — Assessment & Plan Note (Signed)
Acute pain and swelling following a fall on ice and subsequent misstep into a hole. History of previous meniscus tear in the same knee. Pain radiating to foot. -Order knee x-ray to rule out fracture. -Refer to sports medicine for further evaluation and potential MRI. -Continue use of knee brace. -Continue icing and elevate leg. -Refill Ibuprofen 800mg  for pain management.

## 2023-10-12 NOTE — Assessment & Plan Note (Signed)
  Reports current medication regimen is effective. -Refill Adderall prescription, [facility]

## 2023-10-14 ENCOUNTER — Other Ambulatory Visit: Payer: BC Managed Care – PPO

## 2023-10-17 ENCOUNTER — Ambulatory Visit: Payer: BC Managed Care – PPO | Admitting: Family

## 2023-10-17 ENCOUNTER — Encounter: Payer: Self-pay | Admitting: Family

## 2023-10-17 VITALS — BP 112/69 | HR 98 | Temp 98.6°F | Resp 16 | Ht 64.0 in | Wt 193.0 lb

## 2023-10-17 DIAGNOSIS — N879 Dysplasia of cervix uteri, unspecified: Secondary | ICD-10-CM

## 2023-10-17 DIAGNOSIS — R252 Cramp and spasm: Secondary | ICD-10-CM

## 2023-10-17 DIAGNOSIS — F419 Anxiety disorder, unspecified: Secondary | ICD-10-CM

## 2023-10-17 DIAGNOSIS — G43109 Migraine with aura, not intractable, without status migrainosus: Secondary | ICD-10-CM | POA: Diagnosis not present

## 2023-10-17 DIAGNOSIS — R739 Hyperglycemia, unspecified: Secondary | ICD-10-CM | POA: Diagnosis not present

## 2023-10-17 DIAGNOSIS — F909 Attention-deficit hyperactivity disorder, unspecified type: Secondary | ICD-10-CM

## 2023-10-17 DIAGNOSIS — J45909 Unspecified asthma, uncomplicated: Secondary | ICD-10-CM

## 2023-10-17 DIAGNOSIS — R040 Epistaxis: Secondary | ICD-10-CM

## 2023-10-17 DIAGNOSIS — Z1211 Encounter for screening for malignant neoplasm of colon: Secondary | ICD-10-CM

## 2023-10-17 LAB — BASIC METABOLIC PANEL
BUN: 14 mg/dL (ref 6–23)
CO2: 29 meq/L (ref 19–32)
Calcium: 9.1 mg/dL (ref 8.4–10.5)
Chloride: 101 meq/L (ref 96–112)
Creatinine, Ser: 0.77 mg/dL (ref 0.40–1.20)
GFR: 93.06 mL/min (ref >60.00)
Glucose, Bld: 113 mg/dL — ABNORMAL HIGH (ref 70–99)
Potassium: 4 meq/L (ref 3.5–5.1)
Sodium: 138 meq/L (ref 135–145)

## 2023-10-17 LAB — HEMOGLOBIN A1C: Hgb A1c MFr Bld: 6.3 % (ref 4.6–6.5)

## 2023-10-17 LAB — MAGNESIUM: Magnesium: 1.8 mg/dL (ref 1.5–2.5)

## 2023-10-17 NOTE — Assessment & Plan Note (Signed)
Overdue for follow up pap, needs new GYN. Referral placed.

## 2023-10-17 NOTE — Assessment & Plan Note (Signed)
Stable/improved since she is no longer living with a cat.

## 2023-10-17 NOTE — Patient Instructions (Signed)
VISIT SUMMARY:  During today's follow-up appointment, we reviewed your ongoing conditions including ADHD, anxiety, migraines, and asthma. You reported stability and no side effects with your current medications. We also discussed new concerns such as muscle spasms and a recent severe nosebleed. Additionally, we addressed your pre-diabetes and planned for necessary screenings and follow-ups.  YOUR PLAN:  -ADHD: ADHD is a condition characterized by symptoms of inattention, hyperactivity, and impulsivity. You are stable on your current dose of Adderall 30mg  extended release daily with no reported side effects. Please continue with your current regimen.  -ANXIETY: Anxiety is a condition that involves excessive worry or fear. You are stable on Buspar and find it helpful. Please continue with your current regimen.  -MIGRAINES: Migraines are severe headaches often accompanied by nausea and sensitivity to light and sound. You have not had any recent episodes and are stable on Topamax, though you occasionally miss doses. It is important to take your medication consistently. Please continue with your current regimen.  -ASTHMA: Asthma is a condition where your airways narrow and swell, making it difficult to breathe. Your symptoms have improved since removing the cat from your living environment. Please continue with your current management.  -EPISTAXIS (NOSEBLEED): Epistaxis is the medical term for a nosebleed. Your recent severe nosebleed may be due to dry heat and stress. Use a humidifier and nasal saline spray, and resume Flonase. If nosebleeds recur, we may need to refer you to an ENT specialist for further evaluation.  -PRE-DIABETES: Pre-diabetes is a condition where blood sugar levels are higher than normal but not yet high enough to be diagnosed as diabetes. Your A1C has been around 6.3-6.4 for a couple of years. We will check your A1C today and continue with lifestyle modifications to manage this  condition.  -MUSCLE CRAMPS/SPASMS: Muscle cramps or spasms can be caused by various factors, including electrolyte imbalances. We will check your potassium and magnesium levels today to determine if this is the cause.  -GENERAL HEALTH MAINTENANCE: We will refer you to Gastroenterology for a colonoscopy and to Gynecology for a Pap smear due to your history of cervical dysplasia. Please follow up in March for a medication check and schedule your annual physical in May.  INSTRUCTIONS:  Please follow up in March for a medication check and schedule your annual physical in May. Additionally, we will check your A1C, potassium, and magnesium levels today. You will also receive referrals for a colonoscopy and a Pap smear.

## 2023-10-17 NOTE — Assessment & Plan Note (Signed)
Stable/improved on topamax- management per neurology.

## 2023-10-17 NOTE — Assessment & Plan Note (Signed)
New. Check labs as ordered.

## 2023-10-17 NOTE — Progress Notes (Signed)
Subjective:     Patient ID: Barbara Barr, female    DOB: 28-Feb-1976, 47 y.o.   MRN: [id number]  Chief Complaint  Patient presents with   ADHD    Here for follow up    HPI  Discussed the use of AI scribe software for clinical note transcription with the patient, who gave verbal consent to proceed.  History of Present Illness   The patient, with a history of ADHD, anxiety, migraines, and asthma, presents for a follow-up appointment. The patient is currently on Adderall for ADHD, Buspar for anxiety, and Topamax for migraines. The patient reports no side effects from the Adderall and finds the Buspar helpful for managing anxiety. The patient has not had any recent migraines since starting Topamax. The patient also mentions having muscle spasms in the legs and a recent severe nosebleed. The patient has recently moved due to a breakup and has been experiencing some sleep disturbances. The patient also has a history of cervical dysplasia and is due for a Pap smear.       Lab Results  Component Value Date   HGBA1C 6.3 (H) 06/20/2023   HGBA1C 6.3 (H) 02/28/2023   HGBA1C 6.4 (H) 10/11/2022   Lab Results  Component Value Date   LDLCALC 98 02/28/2023   CREATININE 0.77 02/28/2023      Health Maintenance Due  Topic Date Due   Colonoscopy  Never done   Cervical Cancer Screening (HPV/Pap Cotest)  10/31/2023    Past Medical History:  Diagnosis Date   ADHD (attention deficit hyperactivity disorder)    Allergic rhinitis, seasonal    Anemia    Anxiety    B12 deficiency 10/31/2021   Cervical dysplasia    high grade   Environmental allergies    Headache    History of gestational diabetes    History of tobacco abuse 04/07/2018   Obsessive-compulsive disorder    PONV (postoperative nausea and vomiting)    Pre-diabetes    Wears glasses     Past Surgical History:  Procedure Laterality Date   COLPOSCOPY N/A 05/12/2018   Procedure: COLPOSCOPY;  Surgeon: Patton Salles, MD;  Location: [facility];  Service: Gynecology;  Laterality: N/A;   LAPAROSCOPIC CHOLECYSTECTOMY  2003   LEEP N/A 05/12/2018   Procedure: LOOP ELECTROSURGICAL EXCISION PROCEDURE (LEEP) with ECC;  Surgeon: Patton Salles, MD;  Location: [facility];  Service: Gynecology;  Laterality: N/A;  with ECC   LEEP     2021   LEEP  2006   RHINOPLASTY  02/2018   TUBAL LIGATION Bilateral 2006    Family History  Problem Relation Age of Onset   Drug abuse Mother        "was a prostitute"   Bipolar disorder Mother    Dementia Maternal Aunt    Cancer Maternal Aunt    Cervical cancer Maternal Aunt    Cancer Maternal Aunt    ADD / ADHD Maternal Uncle    Stroke Maternal Uncle    Heart attack Maternal Uncle     Social History   Socioeconomic History   Marital status: Media planner    Spouse name: Not on file   Number of children: Not on file   Years of education: Not on file   Highest education level: Bachelor's degree (e.g., BA, AB, BS)  Occupational History   Not on file  Tobacco Use   Smoking status: Former    Current packs/day:  0.50    Average packs/day: 0.5 packs/day for 25.0 years (12.5 ttl pk-yrs)    Types: Cigarettes    Start date: 01/26/2021   Smokeless tobacco: Never  Vaping Use   Vaping status: Every Day  Substance and Sexual Activity   Alcohol use: No   Drug use: No   Sexual activity: Yes    Birth control/protection: Surgical  Other Topics Concern   Not on file  Social History Narrative   Adopted by her great aunt on her mother's side   Has a half sister with her biological mom   Works as an Engineer, civil (consulting)   Lives with her boyfriend   2003- son [name]   2006- Daughter [name]   Enjoys building furniture/arts and crafts/fishing   Completed bachelor's degree   2 [employer]   Social Drivers of Corporate investment banker Strain: Low Risk  (06/20/2023)   Overall Financial Resource Strain (CARDIA)     Difficulty of Paying Living Expenses: Not very hard  Food Insecurity: No Food Insecurity (06/20/2023)   Hunger Vital Sign    Worried About Running Out of Food in the Last Year: Never true    Ran Out of Food in the Last Year: Never true  Transportation Needs: No Transportation Needs (06/20/2023)   PRAPARE - Administrator, Civil Service (Medical): No    Lack of Transportation (Non-Medical): No  Physical Activity: Sufficiently Active (06/20/2023)   Exercise Vital Sign    Days of Exercise per Week: 4 days    Minutes of Exercise per Session: 40 min  Stress: Stress Concern Present (06/20/2023)   [employer] of Occupational Health - Occupational Stress Questionnaire    Feeling of Stress : Very much  Social Connections: Unknown (06/20/2023)   Social Connection and Isolation Panel [NHANES]    Frequency of Communication with Friends and Family: More than three times a week    Frequency of Social Gatherings with Friends and Family: Once a week    Attends Religious Services: Patient declined    Database administrator or Organizations: No    Attends Engineer, structural: Not on file    Marital Status: Separated  Intimate Partner Violence: Unknown (01/29/2022)   Received from [employer], [facility]   HITS    Physically Hurt: Not on file    Insult or Talk Down To: Not on file    Threaten Physical Harm: Not on file    Scream or Curse: Not on file    Outpatient Medications Prior to Visit  Medication Sig Dispense Refill   amphetamine-dextroamphetamine (ADDERALL XR) 30 MG 24 hr capsule Take 1 capsule (30 mg total) by mouth every morning. 30 capsule 0   busPIRone (BUSPAR) 7.5 MG tablet Take 1 tablet (7.5 mg total) by mouth 2 (two) times daily. 180 tablet 1   Cholecalciferol (VITAMIN D3) 75 MCG (3000 UT) TABS Take 1 tablet by mouth daily. 30 tablet    famotidine (PEPCID) 20 MG tablet Take 1 tablet (20 mg total) by mouth 2 (two) times daily. 60 tablet 1   fluticasone  (FLONASE) 50 MCG/ACT nasal spray Place 1 spray into both nostrils 2 (two) times daily. 16 g 3   folic acid (FOLVITE) 1 MG tablet Take 1 tablet (1 mg total) by mouth daily.     gabapentin (NEURONTIN) 300 MG capsule Take 1 capsule (300 mg total) by mouth at bedtime. 90 capsule 1   ibuprofen (ADVIL) 800 MG tablet Take 1 tablet (  800 mg total) by mouth every 8 (eight) hours as needed. 30 tablet 0   L-Tyrosine 1000 MG TABS Take by mouth.     Magnesium Stearate POWD Take 1,000 mg by mouth daily.     ondansetron (ZOFRAN) 4 MG tablet Take 1 tablet (4 mg total) by mouth every 8 (eight) hours as needed for nausea or vomiting. 20 tablet 0   Plecanatide (TRULANCE) 3 MG TABS Take 1 tablet by mouth daily. 30 tablet 2   SUMAtriptan (IMITREX) 100 MG tablet Take 1 tablet as needed.  May repeat after 2 hours.  Maximum 2 tablets in 24 hours. 10 tablet 5   topiramate (TOPAMAX) 25 MG tablet TAKE 1 TABLET BY MOUTH EVERYDAY AT BEDTIME 90 tablet 1   fluconazole (DIFLUCAN) 150 MG tablet One tab by mouth once today, my repeat in 3 days if symptoms persist 2 tablet 0   nitrofurantoin, macrocrystal-monohydrate, (MACROBID) 100 MG capsule Take 1 capsule (100 mg total) by mouth 2 (two) times daily. 10 capsule 0   No facility-administered medications prior to visit.    Allergies  Allergen Reactions   Lexapro [Escitalopram] Hives   Penicillins Hives   Benadryl [Diphenhydramine] Rash   Codeine Itching   Prilosec [Omeprazole]     Severe diarrhea    ROS    See HPI Objective:    Physical Exam Constitutional:      General: She is not in acute distress.    Appearance: Normal appearance. She is well-developed.  HENT:     Head: Normocephalic and atraumatic.     Right Ear: External ear normal.     Left Ear: External ear normal.  Eyes:     General: No scleral icterus. Neck:     Thyroid: No thyromegaly.  Cardiovascular:     Rate and Rhythm: Normal rate and regular rhythm.     Heart sounds: Normal heart sounds. No  murmur heard. Pulmonary:     Effort: Pulmonary effort is normal. No respiratory distress.     Breath sounds: Normal breath sounds. No wheezing.  Musculoskeletal:     Cervical back: Neck supple.  Skin:    General: Skin is warm and dry.  Neurological:     Mental Status: She is alert and oriented to person, place, and time.  Psychiatric:        Mood and Affect: Mood normal.        Behavior: Behavior normal.        Thought Content: Thought content normal.        Judgment: Judgment normal.      BP 112/69 (BP Location: Right Arm, Patient Position: Sitting, Cuff Size: Small)   Pulse 98   Temp 98.6 F (37 C) (Oral)   Resp 16   Ht 5\' 4"  (1.626 m)   Wt 193 lb (87.5 kg)   SpO2 100%   BMI 33.13 kg/m  Wt Readings from Last 3 Encounters:  10/17/23 193 lb (87.5 kg)  10/08/23 184 lb (83.5 kg)  07/18/23 184 lb (83.5 kg)       Assessment & Plan:   Problem List Items Addressed This Visit       Unprioritized   Muscle cramping   New. Check labs as ordered.      Relevant Orders   Basic Metabolic Panel (BMET)   Magnesium   Migraine with aura   Stable/improved on topamax- management per neurology.       Hyperglycemia - Primary   Relevant Orders   HgB A1c  Epistaxis    Recent episode of  nosebleed. Possible cause includes dry heat. -Advise use of humidifier and nasal saline spray. Resume Flonase. If recurrent, consider referral to ENT for possible cauterization.      Cervical dysplasia   Overdue for follow up pap, needs new GYN. Referral placed.       Relevant Orders   Ambulatory referral to Obstetrics / Gynecology   Attention deficit hyperactivity disorder (ADHD)   Stable on Adderall XR 30mg . Continue same. Contract up to date.       Asthma   Stable/improved since she is no longer living with a cat.       Anxiety    Stable on Buspar. Reports medication is helpful. -Continue current regimen.       Other Visit Diagnoses       Screening for colon cancer        Relevant Orders   Ambulatory referral to Gastroenterology       I have discontinued Amelda Hapke A. Slatter's fluconazole and nitrofurantoin (macrocrystal-monohydrate). I am also having her maintain her fluticasone, Vitamin D3, folic acid, Trulance, Magnesium Stearate, SUMAtriptan, L-Tyrosine, ondansetron, busPIRone, famotidine, gabapentin, topiramate, ibuprofen, and amphetamine-dextroamphetamine.  No orders of the defined types were placed in this encounter.

## 2023-10-17 NOTE — Assessment & Plan Note (Signed)
Stable on Adderall XR 30mg . Continue same. Contract up to date.

## 2023-10-17 NOTE — Assessment & Plan Note (Signed)
  Stable on Buspar. Reports medication is helpful. -Continue current regimen.

## 2023-10-17 NOTE — Assessment & Plan Note (Signed)
  Recent episode of  nosebleed. Possible cause includes dry heat. -Advise use of humidifier and nasal saline spray. Resume Flonase. If recurrent, consider referral to ENT for possible cauterization.

## 2023-11-18 ENCOUNTER — Other Ambulatory Visit: Payer: Self-pay | Admitting: Family

## 2023-11-18 DIAGNOSIS — F909 Attention-deficit hyperactivity disorder, unspecified type: Secondary | ICD-10-CM

## 2023-11-18 MED ORDER — AMPHETAMINE-DEXTROAMPHET ER 30 MG PO CP24
30.0000 mg | ORAL_CAPSULE | ORAL | 0 refills | Status: DC
Start: 2023-11-18 — End: 2023-12-20

## 2023-11-18 NOTE — Telephone Encounter (Signed)
Requesting: Adderall XR 30mg   Contract: 02/28/23 UDS: 02/28/23 Last Visit: 10/17/23 Next Visit: None Last Refill: 10/07/23 #30 and 0RF  Please Advise

## 2023-11-19 ENCOUNTER — Encounter: Payer: Self-pay | Admitting: Family

## 2023-11-21 MED ORDER — TRULANCE 3 MG PO TABS: 1.0000 | ORAL_TABLET | Freq: Every day | ORAL | 2 refills | Status: DC

## 2023-11-24 ENCOUNTER — Telehealth: Payer: Self-pay

## 2023-11-24 NOTE — Telephone Encounter (Signed)
PA initiated via Covermymeds; KEY: [id number].  Prior Authorization Not Required

## 2023-11-25 ENCOUNTER — Other Ambulatory Visit (HOSPITAL_COMMUNITY): Payer: Self-pay

## 2023-12-20 ENCOUNTER — Other Ambulatory Visit: Payer: Self-pay | Admitting: Family

## 2023-12-20 DIAGNOSIS — F909 Attention-deficit hyperactivity disorder, unspecified type: Secondary | ICD-10-CM

## 2023-12-23 MED ORDER — AMPHETAMINE-DEXTROAMPHET ER 30 MG PO CP24
30.0000 mg | ORAL_CAPSULE | ORAL | 0 refills | Status: DC
Start: 2023-12-23 — End: 2024-02-06

## 2023-12-24 ENCOUNTER — Other Ambulatory Visit: Payer: Self-pay | Admitting: Family

## 2023-12-24 MED ORDER — IBUPROFEN 800 MG PO TABS: 800.0000 mg | ORAL_TABLET | Freq: Three times a day (TID) | ORAL | 0 refills | Status: DC | PRN

## 2024-01-01 NOTE — Progress Notes (Deleted)
 NEUROLOGY FOLLOW UP OFFICE NOTE  Barbara Barr [id number]  Assessment/Plan:   Ocular migraine   Migraine prevention:  topiramate 25mg  at bedtime *** Migraine rescue:  sumatriptan 100mg  *** Limit use of pain relievers to no more than 2 days out of week to prevent risk of rebound or medication-overuse headache. Keep headache diary Follow up one year       Subjective:  Barbara Barr is a 48 year old female with ADHD who follows up for ocular migraines.  UPDATE: ***  Current NSAIDS/analgesics:  ibuprofen 800mg  Current triptans:  sumatriptan 100mg  Current ergotamine:  none Current anti-emetic:  none Current muscle relaxants:  none Current Antihypertensive medications:  none Current Antidepressant medications:  none Current Anticonvulsant medications:  topiramate 25mg  at bedtime Current anti-CGRP:  none Other therapy:  none Birth control:  none Other medications:  Adderall   HISTORY: Beginning in early 2023, she has reported recurrent transient visual symptoms.  She would experience binocular blurred vision lasting up to an hour.  The blurred vision would resolve when closing either eye.  They have been occurring about twice a week.  No associated headache.  She had one of these episodes during a routine eye exam by her optometrist and exhibited significant worsening of vision compared to the previous year.Marland Kitchen  She was sent to ophthalmologist who did not find anything concerning on exam, however she wasn't having an episode at that time.  MRI of brain without contrast on 04/13/2022 personally reviewed was normal.  Migraines were suspected.  She does require new prescription lenses but her eye doctor doesn't to fit her until her migraines are better controlled.   She does have history of migraines since childhood.  When she would stand up or look up, she may sometimes develop darkening of vision with "fireworks" lasting less than a minute followed by a headache.  Headaches  typically are a stabbing pain in back of head and behind the eyes, associated with photophobi and phonophobia, lasting a couple of days (usually 30 minutes if treated with sumatriptan).  They are infrequent.  She hasn't had a migraine headache in weeks.       Past NSAIDS/analgesics:  naproxen, acetaminophen Past abortive triptans:  none Past abortive ergotamine:  none Past muscle relaxants:  none Past anti-emetic:  none Past antihypertensive medications:  none Past antidepressant medications:  bupropion Past anticonvulsant medications:  none Past anti-CGRP:  none Other past therapies:  none     Family history of headache:  unknown.  She was adopted.    PAST MEDICAL HISTORY: Past Medical History:  Diagnosis Date   ADHD (attention deficit hyperactivity disorder)    Allergic rhinitis, seasonal    Anemia    Anxiety    B12 deficiency 10/31/2021   Cervical dysplasia    high grade   Environmental allergies    Headache    History of gestational diabetes    History of tobacco abuse 04/07/2018   Obsessive-compulsive disorder    PONV (postoperative nausea and vomiting)    Pre-diabetes    Wears glasses     MEDICATIONS: Current Outpatient Medications on File Prior to Visit  Medication Sig Dispense Refill   amphetamine-dextroamphetamine (ADDERALL XR) 30 MG 24 hr capsule Take 1 capsule (30 mg total) by mouth every morning. 30 capsule 0   busPIRone (BUSPAR) 7.5 MG tablet Take 1 tablet (7.5 mg total) by mouth 2 (two) times daily. 180 tablet 1   Cholecalciferol (VITAMIN D3) 75 MCG (3000 UT) TABS Take  1 tablet by mouth daily. 30 tablet    famotidine (PEPCID) 20 MG tablet Take 1 tablet (20 mg total) by mouth 2 (two) times daily. 60 tablet 1   fluticasone (FLONASE) 50 MCG/ACT nasal spray Place 1 spray into both nostrils 2 (two) times daily. 16 g 3   folic acid (FOLVITE) 1 MG tablet Take 1 tablet (1 mg total) by mouth daily.     gabapentin (NEURONTIN) 300 MG capsule Take 1 capsule (300 mg  total) by mouth at bedtime. 90 capsule 1   ibuprofen (ADVIL) 800 MG tablet Take 1 tablet (800 mg total) by mouth every 8 (eight) hours as needed. 30 tablet 0   L-Tyrosine 1000 MG TABS Take by mouth.     Magnesium Stearate POWD Take 1,000 mg by mouth daily.     ondansetron (ZOFRAN) 4 MG tablet Take 1 tablet (4 mg total) by mouth every 8 (eight) hours as needed for nausea or vomiting. 20 tablet 0   Plecanatide (TRULANCE) 3 MG TABS Take 1 tablet (3 mg total) by mouth daily. 30 tablet 2   SUMAtriptan (IMITREX) 100 MG tablet Take 1 tablet as needed.  May repeat after 2 hours.  Maximum 2 tablets in 24 hours. 10 tablet 5   topiramate (TOPAMAX) 25 MG tablet TAKE 1 TABLET BY MOUTH EVERYDAY AT BEDTIME 90 tablet 1   No current facility-administered medications on file prior to visit.    ALLERGIES: Allergies  Allergen Reactions   Lexapro [Escitalopram] Hives   Penicillins Hives   Benadryl [Diphenhydramine] Rash   Codeine Itching   Prilosec [Omeprazole]     Severe diarrhea    FAMILY HISTORY: Family History  Problem Relation Age of Onset   Drug abuse Mother        "was a prostitute"   Bipolar disorder Mother    Dementia Maternal Aunt    Cancer Maternal Aunt    Cervical cancer Maternal Aunt    Cancer Maternal Aunt    ADD / ADHD Maternal Uncle    Stroke Maternal Uncle    Heart attack Maternal Uncle       Objective:  *** General: No acute distress.  Patient appears ***-groomed.   Head:  Normocephalic/atraumatic Eyes:  Fundi examined but not visualized Neck: supple, no paraspinal tenderness, full range of motion Heart:  Regular rate and rhythm Lungs:  Clear to auscultation bilaterally Back: No paraspinal tenderness Neurological Exam: alert and oriented.  Speech fluent and not dysarthric, language intact.  CN II-XII intact. Bulk and tone normal, muscle strength 5/5 throughout.  Sensation to light touch intact.  Deep tendon reflexes 2+ throughout, toes downgoing.  Finger to nose testing  intact.  Gait normal, Romberg negative.   Shon Millet, DO  CC: ***

## 2024-01-02 ENCOUNTER — Encounter: Payer: Self-pay | Admitting: Neurology

## 2024-01-02 ENCOUNTER — Ambulatory Visit: Payer: BC Managed Care – PPO | Admitting: Neurology

## 2024-01-12 ENCOUNTER — Encounter: Payer: Self-pay | Admitting: Family

## 2024-01-12 NOTE — Telephone Encounter (Signed)
 Patient scheduled to see Dr. Laury Axon tomorrow

## 2024-01-13 ENCOUNTER — Ambulatory Visit: Admitting: Family Medicine

## 2024-02-02 ENCOUNTER — Telehealth: Payer: Self-pay

## 2024-02-02 NOTE — Telephone Encounter (Signed)
 Called patient to schedule new patient appointment. Left voicemail with our contact information to call back and schedule.

## 2024-02-06 ENCOUNTER — Other Ambulatory Visit: Payer: Self-pay | Admitting: Family

## 2024-02-06 DIAGNOSIS — F909 Attention-deficit hyperactivity disorder, unspecified type: Secondary | ICD-10-CM

## 2024-02-06 MED ORDER — AMPHETAMINE-DEXTROAMPHET ER 30 MG PO CP24
30.0000 mg | ORAL_CAPSULE | ORAL | 0 refills | Status: DC
Start: 2024-02-06 — End: 2024-02-27

## 2024-02-06 NOTE — Telephone Encounter (Signed)
 Requesting: Adderall XR 30mg   Contract: 11/14/21 UDS: 02/28/23 Last Visit: 10/17/23  Next Visit: 02/27/24 Last Refill: 12/23/23 #30 and 0RF   Please Advise

## 2024-02-27 ENCOUNTER — Ambulatory Visit (INDEPENDENT_AMBULATORY_CARE_PROVIDER_SITE_OTHER): Payer: BC Managed Care – PPO | Admitting: Family

## 2024-02-27 VITALS — BP 120/74 | HR 82 | Temp 97.7°F | Resp 16 | Ht 64.0 in | Wt 191.0 lb

## 2024-02-27 DIAGNOSIS — Z0000 Encounter for general adult medical examination without abnormal findings: Secondary | ICD-10-CM | POA: Diagnosis not present

## 2024-02-27 DIAGNOSIS — K589 Irritable bowel syndrome without diarrhea: Secondary | ICD-10-CM

## 2024-02-27 DIAGNOSIS — R252 Cramp and spasm: Secondary | ICD-10-CM

## 2024-02-27 DIAGNOSIS — F909 Attention-deficit hyperactivity disorder, unspecified type: Secondary | ICD-10-CM

## 2024-02-27 DIAGNOSIS — R739 Hyperglycemia, unspecified: Secondary | ICD-10-CM

## 2024-02-27 DIAGNOSIS — K625 Hemorrhage of anus and rectum: Secondary | ICD-10-CM | POA: Insufficient documentation

## 2024-02-27 DIAGNOSIS — Z1231 Encounter for screening mammogram for malignant neoplasm of breast: Secondary | ICD-10-CM

## 2024-02-27 MED ORDER — TRULANCE 3 MG PO TABS: 1.0000 | ORAL_TABLET | Freq: Every day | ORAL | 1 refills | Status: AC

## 2024-02-27 MED ORDER — AMPHETAMINE-DEXTROAMPHET ER 20 MG PO CP24
40.0000 mg | ORAL_CAPSULE | Freq: Every day | ORAL | 0 refills | Status: DC
Start: 2024-02-27 — End: 2024-04-05

## 2024-02-27 NOTE — Assessment & Plan Note (Signed)
 Wellness Visit Discussed general health maintenance, including screenings and lifestyle modifications. Physically active, maintains a healthy diet, but struggles with weight management. Emphasized importance of adequate caloric intake to prevent metabolic slowdown. - Order blood work including A1c and kidney function tests. - Schedule Pap smear. - Schedule mammogram. - Provide GI number for colonoscopy scheduling. - Schedule vision appointment for June 22. - Schedule dental fillings.

## 2024-02-27 NOTE — Assessment & Plan Note (Signed)
 Uncontrolled on adderall xr 30mg  once daily. Will increase to two 20mg  tabs for 40mg  XR once daily.

## 2024-02-27 NOTE — Patient Instructions (Addendum)
 Please call [facility] to schedule your colonoscopy. [phone]  VISIT SUMMARY:  During your visit, we discussed your difficulties with focus and concentration, as well as other health concerns including constipation, perimenopause symptoms, and arthritis. We adjusted your ADHD medication and planned several follow-up tests and appointments to address your overall health.  YOUR PLAN:  ATTENTION DEFICIT DISORDER: You are experiencing inadequate focus at work and home with your current medication. -We have prescribed Adderall 20 mg extended release, two pills in the morning. -Follow up in six weeks to assess the effectiveness of the new dosage.  CERVICAL CANCER: Please keep upcoming visit with GYN.  CONSTIPATION: You have intermittent constipation managed with Trulance  and occasional blood in your stool likely due to hemorrhoids. -Continue using Trulance  as needed. -Schedule a colonoscopy to rule out other causes of blood in your stool.  PERIMENOPAUSE: You are experiencing symptoms such as emotional changes and irregular menstrual cycles. -You prefer to manage these symptoms without medication at this time.  WELLNESS VISIT: We discussed general health maintenance, including screenings and lifestyle modifications. -Order blood work including A1c and kidney function tests. -Schedule a Pap smear. -Schedule a mammogram. -Schedule your colonoscopy. -Schedule a vision appointment for June 22. -Schedule dental fillings.

## 2024-02-27 NOTE — Progress Notes (Signed)
 Subjective:     Patient ID: Barbara Barr, female    DOB: 22-Apr-1976, 48 y.o.   MRN: [id number]  Chief Complaint  Patient presents with   Annual Exam    HPI  Discussed the use of AI scribe software for clinical note transcription with the patient, who gave verbal consent to proceed.  History of Present Illness    Immunizations: up to date Diet: healthy Exercise: mows lawn Colonoscopy: due  Pap Smear: due Mammogram: due Vision: up to date Dental: up to date      Health Maintenance Due  Topic Date Due   Pneumococcal Vaccine 23-61 Years old (1 of 2 - PCV) Never done   Colonoscopy  Never done   Cervical Cancer Screening (HPV/Pap Cotest)  10/31/2023    Past Medical History:  Diagnosis Date   ADHD (attention deficit hyperactivity disorder)    Allergic rhinitis, seasonal    Anemia    Anxiety    B12 deficiency 10/31/2021   Cervical dysplasia    high grade   Environmental allergies    Headache    History of gestational diabetes    History of tobacco abuse 04/07/2018   Obsessive-compulsive disorder    PONV (postoperative nausea and vomiting)    Pre-diabetes    Wears glasses     Past Surgical History:  Procedure Laterality Date   COLPOSCOPY N/A 05/12/2018   Procedure: COLPOSCOPY;  Surgeon: Greta Leatherwood, MD;  Location: [facility];  Service: Gynecology;  Laterality: N/A;   LAPAROSCOPIC CHOLECYSTECTOMY  2003   LEEP N/A 05/12/2018   Procedure: LOOP ELECTROSURGICAL EXCISION PROCEDURE (LEEP) with ECC;  Surgeon: Greta Leatherwood, MD;  Location: [facility];  Service: Gynecology;  Laterality: N/A;  with ECC   LEEP     2021   LEEP  2006   RHINOPLASTY  02/2018   TUBAL LIGATION Bilateral 2006    Family History  Adopted: Yes  Problem Relation Age of Onset   Drug abuse Mother        "was a prostitute"   Bipolar disorder Mother    Bipolar disorder Son    Dementia Maternal Aunt    Cancer Maternal Aunt     Cervical cancer Maternal Aunt    Cancer Maternal Aunt    ADD / ADHD Maternal Uncle    Stroke Maternal Uncle    Heart attack Maternal Uncle     Social History   Socioeconomic History   Marital status: Media planner    Spouse name: Not on file   Number of children: Not on file   Years of education: Not on file   Highest education level: Bachelor's degree (e.g., BA, AB, BS)  Occupational History   Not on file  Tobacco Use   Smoking status: Former    Current packs/day: 0.50    Average packs/day: 0.5 packs/day for 25.0 years (12.5 ttl pk-yrs)    Types: Cigarettes    Start date: 01/26/2021   Smokeless tobacco: Never  Vaping Use   Vaping status: Every Day  Substance and Sexual Activity   Alcohol use: No   Drug use: No   Sexual activity: Yes    Birth control/protection: Surgical  Other Topics Concern   Not on file  Social History Narrative   Adopted by her great aunt on her mother's side   Has a half sister with her biological mom   Works as an Engineer, civil (consulting)   Lives with her  boyfriend   2003- son Lyell Samuel   2006- Daughter Marin Shutters   Enjoys building furniture/arts and crafts/fishing   Completed bachelor's degree   2 [employer]pher[employer]   Social Drivers of Corporate investment banker Strain: Low Risk  (02/27/2024)   Overall Financial Resource Strain (CARDIA)    Difficulty of Paying Living Expenses: Not hard at all  Food Insecurity: No Food Insecurity (02/27/2024)   Hunger Vital Sign    Worried About Running Out of Food in the Last Year: Never true    Ran Out of Food in the Last Year: Never true  Transportation Needs: No Transportation Needs (02/27/2024)   PRAPARE - Administrator, Civil Service (Medical): No    Lack of Transportation (Non-Medical): No  Physical Activity: Sufficiently Active (02/27/2024)   Exercise Vital Sign    Days of Exercise per Week: 5 days    Minutes of Exercise per Session: 50 min  Stress: Stress Concern Present (02/27/2024)   [employer] of Occupational Health - Occupational Stress Questionnaire    Feeling of Stress : To some extent  Social Connections: Socially Isolated (02/27/2024)   Social Connection and Isolation Panel [NHANES]    Frequency of Communication with Friends and Family: More than three times a week    Frequency of Social Gatherings with Friends and Family: Twice a week    Attends Religious Services: Never    Database administrator or Organizations: No    Attends Engineer, structural: Not on file    Marital Status: Divorced  Intimate Partner Violence: Unknown (01/29/2022)   Received from [employer], [facility]   HITS    Physically Hurt: Not on file    Insult or Talk Down To: Not on file    Threaten Physical Harm: Not on file    Scream or Curse: Not on file    Outpatient Medications Prior to Visit  Medication Sig Dispense Refill   busPIRone  (BUSPAR ) 7.5 MG tablet Take 1 tablet (7.5 mg total) by mouth 2 (two) times daily. 180 tablet 0   Cholecalciferol (VITAMIN D3) 75 MCG (3000 UT) TABS Take 1 tablet by mouth daily. 30 tablet    famotidine  (PEPCID ) 20 MG tablet Take 1 tablet (20 mg total) by mouth 2 (two) times daily. 60 tablet 1   fluticasone  (FLONASE ) 50 MCG/ACT nasal spray Place 1 spray into both nostrils 2 (two) times daily. 16 g 3   folic acid  (FOLVITE ) 1 MG tablet Take 1 tablet (1 mg total) by mouth daily.     gabapentin  (NEURONTIN ) 300 MG capsule Take 1 capsule (300 mg total) by mouth at bedtime. 90 capsule 1   ibuprofen  (ADVIL ) 800 MG tablet Take 1 tablet (800 mg total) by mouth every 8 (eight) hours as needed. 30 tablet 0   L-Tyrosine 1000 MG TABS Take by mouth.     Magnesium Stearate POWD Take 1,000 mg by mouth daily.     ondansetron  (ZOFRAN ) 4 MG tablet Take 1 tablet (4 mg total) by mouth every 8 (eight) hours as needed for nausea or vomiting. 20 tablet 0   SUMAtriptan  (IMITREX ) 100 MG tablet Take 1 tablet as needed.  May repeat after 2 hours.  Maximum 2 tablets in 24 hours.  10 tablet 5   topiramate  (TOPAMAX ) 25 MG tablet TAKE 1 TABLET BY MOUTH EVERYDAY AT BEDTIME 90 tablet 1   amphetamine -dextroamphetamine  (ADDERALL XR) 30 MG 24 hr capsule Take 1 capsule (30 mg total) by mouth every morning.  30 capsule 0   Plecanatide  (TRULANCE ) 3 MG TABS Take 1 tablet (3 mg total) by mouth daily. 30 tablet 2   No facility-administered medications prior to visit.    Allergies  Allergen Reactions   Lexapro [Escitalopram] Hives   Penicillins Hives   Benadryl [Diphenhydramine] Rash   Codeine Itching   Prilosec [Omeprazole ]     Severe diarrhea    Review of Systems  Constitutional:  Negative for weight loss.  HENT:  Negative for congestion and hearing loss.   Eyes:  Negative for blurred vision.       ? Visual auras  Respiratory:  Negative for cough.   Cardiovascular:  Negative for leg swelling.  Gastrointestinal:  Positive for blood in stool and constipation.  Genitourinary:  Negative for dysuria and frequency.  Musculoskeletal:  Negative for joint pain and myalgias.  Skin:  Negative for rash.  Neurological:  Negative for headaches.  Psychiatric/Behavioral:         Reports that she has been more emotional lately- attributes to hormones.       Objective:    Physical Exam   BP 120/74 (BP Location: Right Arm, Patient Position: Sitting, Cuff Size: Normal)   Pulse 82   Temp 97.7 F (36.5 C) (Oral)   Resp 16   Ht 5\' 4"  (1.626 m)   Wt 191 lb (86.6 kg)   SpO2 100%   BMI 32.79 kg/m  Wt Readings from Last 3 Encounters:  02/27/24 191 lb (86.6 kg)  10/17/23 193 lb (87.5 kg)  10/08/23 184 lb (83.5 kg)  Physical Exam  Constitutional: She is oriented to person, place, and time. She appears well-developed and well-nourished. No distress.  HENT:  Head: Normocephalic and atraumatic.  Right Ear: Tympanic membrane and ear canal normal.  Left Ear: Tympanic membrane and ear canal normal.  Mouth/Throat: Oropharynx is clear and moist.  Eyes: Pupils are equal, round, and  reactive to light. No scleral icterus.  Neck: Normal range of motion. No thyromegaly present.  Cardiovascular: Normal rate and regular rhythm.   No murmur heard. Pulmonary/Chest: Effort normal and breath sounds normal. No respiratory distress. He has no wheezes. She has no rales. She exhibits no tenderness.  Abdominal: Soft. Bowel sounds are normal. She exhibits no distension and no mass. There is no tenderness. There is no rebound and no guarding.  Musculoskeletal: She exhibits no edema.  Lymphadenopathy:    She has no cervical adenopathy.  Neurological: She is alert and oriented to person, place, and time. She has normal patellar reflexes. She exhibits normal muscle tone. Coordination normal.  Skin: Skin is warm and dry.  Psychiatric: She has a normal mood and affect. Her behavior is normal. Judgment and thought content normal.         Assessment & Plan:        Assessment & Plan:   Problem List Items Addressed This Visit       Unprioritized   Rectal bleeding   Noted a couple of episodes of blood in stool. Advised pt to call GI to schedule her colonoscopy as soon as possible.       Relevant Orders   CBC w/Diff   Preventative health care - Primary   Wellness Visit Discussed general health maintenance, including screenings and lifestyle modifications. Physically active, maintains a healthy diet, but struggles with weight management. Emphasized importance of adequate caloric intake to prevent metabolic slowdown. - Order blood work including A1c and kidney function tests. - Schedule Pap smear. - Schedule  mammogram. - Provide GI number for colonoscopy scheduling. - Schedule vision appointment for June 22. - Schedule dental fillings.      IBS (irritable bowel syndrome)   Improved with Trulance . Continue same.       Relevant Medications   Plecanatide  (TRULANCE ) 3 MG TABS   Hyperglycemia   Update A1C, she is working on a reduced carb diet.      Relevant Orders   Comp  Met (CMET)   HgB A1c   Attention deficit hyperactivity disorder (ADHD)   Uncontrolled on adderall xr 30mg  once daily. Will increase to two 20mg  tabs for 40mg  XR once daily.       Relevant Medications   amphetamine -dextroamphetamine  (ADDERALL XR) 20 MG 24 hr capsule   Other Relevant Orders   DRUG MONITORING, PANEL 8 WITH CONFIRMATION, URINE   Other Visit Diagnoses       Breast cancer screening by mammogram       Relevant Orders   MM 3D SCREENING MAMMOGRAM BILATERAL BREAST       I have discontinued Tylisa Alcivar A. Ivie's amphetamine -dextroamphetamine . I am also having her start on amphetamine -dextroamphetamine . Additionally, I am having her maintain her fluticasone , Vitamin D3, folic acid , Magnesium Stearate, SUMAtriptan , L-Tyrosine, ondansetron , famotidine , gabapentin , topiramate , ibuprofen , busPIRone , and Trulance .  Meds ordered this encounter  Medications   amphetamine -dextroamphetamine  (ADDERALL XR) 20 MG 24 hr capsule    Sig: Take 2 capsules (40 mg total) by mouth daily.    Dispense:  60 capsule    Refill:  0    Supervising Provider:   Randie Bustle A [[id number]]   Plecanatide  (TRULANCE ) 3 MG TABS    Sig: Take 1 tablet (3 mg total) by mouth daily.    Dispense:  90 tablet    Refill:  1    Supervising Provider:   Randie Bustle A [[id number]]

## 2024-02-27 NOTE — Addendum Note (Signed)
 Addended by: Susa Engman A on: 02/27/2024 02:11 PM   Modules accepted: Orders

## 2024-02-27 NOTE — Assessment & Plan Note (Signed)
 Improved with Trulance . Continue same.

## 2024-02-27 NOTE — Assessment & Plan Note (Signed)
 Noted a couple of episodes of blood in stool. Advised pt to call GI to schedule her colonoscopy as soon as possible.

## 2024-02-27 NOTE — Assessment & Plan Note (Signed)
 Update A1C, she is working on a reduced carb diet.

## 2024-02-28 LAB — COMPREHENSIVE METABOLIC PANEL WITH GFR
AG Ratio: 1.3 (calc) (ref 1.0–2.5)
ALT: 17 U/L (ref 6–29)
AST: 17 U/L (ref 10–35)
Albumin: 3.9 g/dL (ref 3.6–5.1)
Alkaline phosphatase (APISO): 70 U/L (ref 31–125)
BUN: 14 mg/dL (ref 7–25)
CO2: 28 mmol/L (ref 20–32)
Calcium: 9.7 mg/dL (ref 8.6–10.2)
Chloride: 103 mmol/L (ref 98–110)
Creat: 0.78 mg/dL (ref 0.50–0.99)
Globulin: 3.1 g/dL (ref 1.9–3.7)
Glucose, Bld: 125 mg/dL — ABNORMAL HIGH (ref 65–99)
Potassium: 3.8 mmol/L (ref 3.5–5.3)
Sodium: 139 mmol/L (ref 135–146)
Total Bilirubin: 0.5 mg/dL (ref 0.2–1.2)
Total Protein: 7 g/dL (ref 6.1–8.1)
eGFR: 95 mL/min/{1.73_m2} (ref >=60)

## 2024-02-28 LAB — CBC WITH DIFFERENTIAL/PLATELET
Absolute Lymphocytes: 2350 {cells}/uL (ref 850–3900)
Absolute Monocytes: 484 {cells}/uL (ref 200–950)
Basophils Absolute: 44 {cells}/uL (ref 0–200)
Basophils Relative: 0.5 %
Eosinophils Absolute: 123 {cells}/uL (ref 15–500)
Eosinophils Relative: 1.4 %
HCT: 39.8 % (ref 35.0–45.0)
Hemoglobin: 12.9 g/dL (ref 11.7–15.5)
MCH: 28 pg (ref 27.0–33.0)
MCHC: 32.4 g/dL (ref 32.0–36.0)
MCV: 86.3 fL (ref 80.0–100.0)
MPV: 10 fL (ref 7.5–12.5)
Monocytes Relative: 5.5 %
Neutro Abs: 5799 {cells}/uL (ref 1500–7800)
Neutrophils Relative %: 65.9 %
Platelets: 274 10*3/uL (ref 140–400)
RBC: 4.61 10*6/uL (ref 3.80–5.10)
RDW: 12.7 % (ref 11.0–15.0)
Total Lymphocyte: 26.7 %
WBC: 8.8 10*3/uL (ref 3.8–10.8)

## 2024-02-28 LAB — HEMOGLOBIN A1C
Hgb A1c MFr Bld: 6.3 % — ABNORMAL HIGH (ref <5.7)
Mean Plasma Glucose: 134 mg/dL
eAG (mmol/L): 7.4 mmol/L

## 2024-02-28 LAB — MAGNESIUM: Magnesium: 1.9 mg/dL (ref 1.5–2.5)

## 2024-02-28 LAB — DM TEMPLATE

## 2024-03-01 ENCOUNTER — Encounter: Payer: Self-pay | Admitting: Family

## 2024-03-01 ENCOUNTER — Telehealth: Payer: Self-pay

## 2024-03-01 LAB — DRUG MONITORING, PANEL 8 WITH CONFIRMATION, URINE
6 Acetylmorphine: NEGATIVE ng/mL (ref <10)
Alcohol Metabolites: NEGATIVE ng/mL (ref <500)
Amphetamine: 4390 ng/mL — ABNORMAL HIGH (ref <250)
Amphetamines: POSITIVE ng/mL — AB (ref <500)
Benzodiazepines: NEGATIVE ng/mL (ref <100)
Buprenorphine, Urine: NEGATIVE ng/mL (ref <5)
Cocaine Metabolite: NEGATIVE ng/mL (ref <150)
Creatinine: 149.9 mg/dL (ref >=20.0)
MDMA: NEGATIVE ng/mL (ref <500)
Marijuana Metabolite: NEGATIVE ng/mL (ref <20)
Opiates: NEGATIVE ng/mL (ref <100)
Oxidant: NEGATIVE ug/mL (ref <200)
Oxycodone: NEGATIVE ng/mL (ref <100)
pH: 6.6 (ref 4.5–9.0)

## 2024-03-01 LAB — DM TEMPLATE

## 2024-03-01 NOTE — Telephone Encounter (Signed)
 Pharmacy Patient Advocate Encounter   Received notification from CoverMyMeds that prior authorization for Trulance  3MG  tablets is required/requested.   Insurance verification completed.   The patient is insured through [facility] .   Per test claim: PA required; PA started via CoverMyMeds. KEY [id number] . Waiting for clinical questions to populate.

## 2024-03-02 NOTE — Telephone Encounter (Signed)
 Clinical questions have been answered and PA submitted.TO PLAN. PA currently Pending.

## 2024-03-03 ENCOUNTER — Other Ambulatory Visit (HOSPITAL_COMMUNITY): Payer: Self-pay

## 2024-03-03 NOTE — Telephone Encounter (Signed)
 Pharmacy Patient Advocate Encounter  Received notification from [facility] that Prior Authorization for Trulance  has been APPROVED from 03/02/2024 to 03/02/2025   PA #/Case ID/Reference #: [id number]

## 2024-03-10 NOTE — Progress Notes (Unsigned)
 NEUROLOGY FOLLOW UP OFFICE NOTE  Barbara Barr [id number]  Assessment/Plan:   Ocular migraines    Migraine prevention:  topiramate  25mg  at bedtime.  It topiramate  is the cause of her tinnitus, it would be a rare side effect.  Option would be to change to another preventative, suggested propranolol.  She will let me know if she wishes to make the change Migraine rescue:  sumatriptan  100mg   Limit use of pain relievers to no more than 2 days out of week to prevent risk of rebound or medication-overuse headache. Keep headache diary Follow up one year (move up to 6 months if we switch topiramate )       Subjective:  Barbara Barr is a 47 year old female with ADHD who follows up for ocular migraines.  UPDATE: No migraine headaches.  Ocular migraines without headache occurs occasionally, but not significant.    She notes that she sometimes has ringing in her ears.  It started after starting topiramate .  Current NSAIDS/analgesics:  ibuprofen  800mg  Current triptans:  sumatriptan  100mg  Current ergotamine:  none Current anti-emetic:  none Current muscle relaxants:  none Current Antihypertensive medications:  none Current Antidepressant medications:  none Current Anticonvulsant medications:  topiramate  25mg  at bedtime, gabapentin  300mg  at bedtime (hot flashes) Current anti-CGRP:  none Other therapy:  none Birth control:  none Other medications:  Adderall   HISTORY: Beginning in early 2023, she has reported recurrent transient visual symptoms.  She would experience binocular blurred vision lasting up to an hour.  The blurred vision would resolve when closing either eye.  They have been occurring about twice a week.  No associated headache.  She had one of these episodes during a routine eye exam by her optometrist and exhibited significant worsening of vision compared to the previous year.Barbara Barr  She was sent to ophthalmologist who did not find anything concerning on exam, however she  wasn't having an episode at that time.  MRI of brain without contrast on 04/13/2022 personally reviewed was normal.  Migraines were suspected.  She does require new prescription lenses but her eye doctor doesn't to fit her until her migraines are better controlled.   She does have history of migraines since childhood.  When she would stand up or look up, she may sometimes develop darkening of vision with "fireworks" lasting less than a minute followed by a headache.  Headaches typically are a stabbing pain in back of head and behind the eyes, associated with photophobi and phonophobia, lasting a couple of days (usually 30 minutes if treated with sumatriptan ).  They are infrequent.  She hasn't had a migraine headache in weeks.       Past NSAIDS/analgesics:  naproxen , acetaminophen  Past abortive triptans:  none Past abortive ergotamine:  none Past muscle relaxants:  none Past anti-emetic:  none Past antihypertensive medications:  none Past antidepressant medications:  bupropion  Past anticonvulsant medications:  none Past anti-CGRP:  none Other past therapies:  none     Family history of headache:  unknown.  She was adopted.    PAST MEDICAL HISTORY: Past Medical History:  Diagnosis Date   ADHD (attention deficit hyperactivity disorder)    Allergic rhinitis, seasonal    Anemia    Anxiety    B12 deficiency 10/31/2021   Cervical dysplasia    high grade   Environmental allergies    Headache    History of gestational diabetes    History of tobacco abuse 04/07/2018   Obsessive-compulsive disorder    PONV (postoperative  nausea and vomiting)    Pre-diabetes    Wears glasses     MEDICATIONS: Current Outpatient Medications on File Prior to Visit  Medication Sig Dispense Refill   amphetamine -dextroamphetamine  (ADDERALL XR) 20 MG 24 hr capsule Take 2 capsules (40 mg total) by mouth daily. 60 capsule 0   busPIRone  (BUSPAR ) 7.5 MG tablet Take 1 tablet (7.5 mg total) by mouth 2 (two) times  daily. 180 tablet 0   Cholecalciferol (VITAMIN D3) 75 MCG (3000 UT) TABS Take 1 tablet by mouth daily. 30 tablet    famotidine  (PEPCID ) 20 MG tablet Take 1 tablet (20 mg total) by mouth 2 (two) times daily. 60 tablet 1   fluticasone  (FLONASE ) 50 MCG/ACT nasal spray Place 1 spray into both nostrils 2 (two) times daily. 16 g 3   folic acid  (FOLVITE ) 1 MG tablet Take 1 tablet (1 mg total) by mouth daily.     gabapentin  (NEURONTIN ) 300 MG capsule Take 1 capsule (300 mg total) by mouth at bedtime. 90 capsule 1   ibuprofen  (ADVIL ) 800 MG tablet Take 1 tablet (800 mg total) by mouth every 8 (eight) hours as needed. 30 tablet 0   L-Tyrosine 1000 MG TABS Take by mouth.     Magnesium Stearate POWD Take 1,000 mg by mouth daily.     ondansetron  (ZOFRAN ) 4 MG tablet Take 1 tablet (4 mg total) by mouth every 8 (eight) hours as needed for nausea or vomiting. 20 tablet 0   Plecanatide  (TRULANCE ) 3 MG TABS Take 1 tablet (3 mg total) by mouth daily. 90 tablet 1   SUMAtriptan  (IMITREX ) 100 MG tablet Take 1 tablet as needed.  May repeat after 2 hours.  Maximum 2 tablets in 24 hours. 10 tablet 5   topiramate  (TOPAMAX ) 25 MG tablet TAKE 1 TABLET BY MOUTH EVERYDAY AT BEDTIME 90 tablet 1   No current facility-administered medications on file prior to visit.    ALLERGIES: Allergies  Allergen Reactions   Lexapro [Escitalopram] Hives   Penicillins Hives   Benadryl [Diphenhydramine] Rash   Codeine Itching   Prilosec [Omeprazole ]     Severe diarrhea    FAMILY HISTORY: Family History  Adopted: Yes  Problem Relation Age of Onset   Drug abuse Mother        "was a prostitute"   Bipolar disorder Mother    Bipolar disorder Son    Dementia Maternal Aunt    Cancer Maternal Aunt    Cervical cancer Maternal Aunt    Cancer Maternal Aunt    ADD / ADHD Maternal Uncle    Stroke Maternal Uncle    Heart attack Maternal Uncle       Objective:  Blood pressure 127/75, pulse 99, height 5\' 4"  (1.626 m), weight 191 lb  (86.6 kg), SpO2 100%. General: No acute distress.  Patient appears well-groomed.   Head:  Normocephalic/atraumatic Eyes:  Fundi examined but not visualized Neck: supple, no paraspinal tenderness, full range of motion Heart:  Regular rate and rhythm Neurological Exam: alert and oriented.  Speech fluent and not dysarthric, language intact.  CN II-XII intact. Bulk and tone normal, muscle strength 5/5 throughout.  Sensation to light touch intact.  Deep tendon reflexes 2+ throughout, toes downgoing.  Finger to nose testing intact.  Gait normal, Romberg negative.   Janne Members, DO  CC: Dorrene Gaucher, NP

## 2024-03-11 ENCOUNTER — Ambulatory Visit: Admitting: Neurology

## 2024-03-11 ENCOUNTER — Encounter: Payer: Self-pay | Admitting: Neurology

## 2024-03-11 VITALS — BP 127/75 | HR 99 | Ht 64.0 in | Wt 191.0 lb

## 2024-03-11 DIAGNOSIS — G43109 Migraine with aura, not intractable, without status migrainosus: Secondary | ICD-10-CM

## 2024-03-11 MED ORDER — TOPIRAMATE 25 MG PO TABS: 25.0000 mg | ORAL_TABLET | Freq: Every day | ORAL | 1 refills | Status: AC

## 2024-03-11 MED ORDER — SUMATRIPTAN SUCCINATE 100 MG PO TABS: ORAL_TABLET | ORAL | 5 refills | Status: AC

## 2024-03-11 NOTE — Patient Instructions (Signed)
 Continue topiramate  25mg  at bedtime for now.  Consider changing to PROPRANOLOL.  Let me know Sumatriptan  as needed Follow up 1 year (move up to 6 months if we change medication)

## 2024-04-05 ENCOUNTER — Encounter: Payer: Self-pay | Admitting: Family

## 2024-04-05 DIAGNOSIS — F909 Attention-deficit hyperactivity disorder, unspecified type: Secondary | ICD-10-CM

## 2024-04-05 MED ORDER — IBUPROFEN 800 MG PO TABS: 800.0000 mg | ORAL_TABLET | Freq: Three times a day (TID) | ORAL | 0 refills | Status: DC | PRN

## 2024-04-05 MED ORDER — AMPHETAMINE-DEXTROAMPHET ER 20 MG PO CP24: 40.0000 mg | ORAL_CAPSULE | Freq: Every day | ORAL | 0 refills | Status: DC

## 2024-04-05 NOTE — Telephone Encounter (Signed)
 Requesting: adderall Contract:03/11/24 UDS:03/11/24 Last Visit:02/27/24 Next Visit:04/09/24 Last Refill:02/27/24  Please Advise

## 2024-04-08 ENCOUNTER — Inpatient Hospital Stay (HOSPITAL_BASED_OUTPATIENT_CLINIC_OR_DEPARTMENT_OTHER): Admission: RE | Admit: 2024-04-08 | Source: Ambulatory Visit

## 2024-04-09 ENCOUNTER — Ambulatory Visit: Admitting: Family

## 2024-04-23 ENCOUNTER — Encounter: Payer: Self-pay | Admitting: Family

## 2024-04-28 ENCOUNTER — Ambulatory Visit: Admitting: Family

## 2024-04-28 ENCOUNTER — Other Ambulatory Visit (HOSPITAL_BASED_OUTPATIENT_CLINIC_OR_DEPARTMENT_OTHER): Payer: Self-pay

## 2024-04-28 VITALS — BP 115/62 | HR 98 | Temp 98.5°F | Resp 16 | Ht 64.0 in | Wt 190.0 lb

## 2024-04-28 DIAGNOSIS — R21 Rash and other nonspecific skin eruption: Secondary | ICD-10-CM | POA: Diagnosis not present

## 2024-04-28 DIAGNOSIS — R300 Dysuria: Secondary | ICD-10-CM | POA: Diagnosis not present

## 2024-04-28 LAB — POC URINALSYSI DIPSTICK (AUTOMATED)
Bilirubin, UA: NEGATIVE
Blood, UA: NEGATIVE
Glucose, UA: NEGATIVE
Ketones, UA: NEGATIVE
Nitrite, UA: NEGATIVE
Protein, UA: NEGATIVE
Spec Grav, UA: 1.01 (ref 1.010–1.025)
Urobilinogen, UA: 0.2 U/dL
pH, UA: 7 (ref 5.0–8.0)

## 2024-04-28 MED ORDER — PREDNISONE 10 MG PO TABS
ORAL_TABLET | ORAL | 0 refills | Status: AC
  Filled 2024-04-28: qty 20, 8d supply, fill #0

## 2024-04-28 MED ORDER — METHYLPREDNISOLONE ACETATE 40 MG/ML IJ SUSP
40.0000 mg | Freq: Once | INTRAMUSCULAR | Status: AC
  Administered 2024-04-28: 80 mg via INTRAMUSCULAR

## 2024-04-28 NOTE — Patient Instructions (Signed)
 VISIT SUMMARY:  Today, you were seen for a widespread itchy rash and symptoms suggesting a possible urinary tract infection (UTI).  YOUR PLAN:  PRURITIC RASH: You have a widespread itchy rash with red bumps and some pustules. It is very itchy, especially at night. -You received a depo-medrol  injection today for rapid relief. -You will take oral prednisone  as a taper over one week. -Continue taking Allegra daily until the rash resolves. -Use calamine lotion and topical hydrocortisone to help with the itching. -Oatmeal baths are recommended to soothe your skin.  POSSIBLE URINARY TRACT INFECTION (UTI): You have symptoms of a urinary tract infection, including painful urination and dark, cloudy urine. -Your urine has been sent for a culture to confirm the infection. -If the culture is positive, you will be started on appropriate antibiotics.

## 2024-04-28 NOTE — Progress Notes (Signed)
 Subjective:     Patient ID: Barbara Barr, female    DOB: September 30, 1976, 48 y.o.   MRN: [id number]  Chief Complaint  Patient presents with   Rash    Complains of skin rash that started yesterday evening    Dysuria    Patient reports burning with urination and dark urine     Discussed the use of AI scribe software for clinical note transcription with the patient, who gave verbal consent to proceed.  History of Present Illness  Barbara Barr is a 48 year old female who presents with a widespread itchy rash and possible urinary tract infection.  The rash began last night with red bumps on patchy erythema, some developing pustules, located on her body including a patch near the shoulder. It is intensely pruritic, especially at night, causing excoriation.  Allegra provided partial relief, but lotion was ineffective. She cannot use Benadryl due to side effect of hives. There has been no recent known exposure to allergens such as poison ivy or pets.  She reports dysuria and dark, cloudy urine. She is concerned about the possibility of UTI.    Health Maintenance Due  Topic Date Due   Pneumococcal Vaccine 19-24 Years old (1 of 2 - PCV) Never done   Hepatitis B Vaccines (1 of 3 - 19+ 3-dose series) Never done   Colonoscopy  Never done   Cervical Cancer Screening (HPV/Pap Cotest)  10/31/2023    Past Medical History:  Diagnosis Date   ADHD (attention deficit hyperactivity disorder)    Allergic rhinitis, seasonal    Anemia    Anxiety    B12 deficiency 10/31/2021   Cervical dysplasia    high grade   Environmental allergies    Headache    History of gestational diabetes    History of tobacco abuse 04/07/2018   Obsessive-compulsive disorder    PONV (postoperative nausea and vomiting)    Pre-diabetes    Wears glasses     Past Surgical History:  Procedure Laterality Date   COLPOSCOPY N/A 05/12/2018   Procedure: COLPOSCOPY;  Surgeon: Cathlyn JAYSON Nikki Bobie FORBES, MD;   Location: [facility];  Service: Gynecology;  Laterality: N/A;   LAPAROSCOPIC CHOLECYSTECTOMY  2003   LEEP N/A 05/12/2018   Procedure: LOOP ELECTROSURGICAL EXCISION PROCEDURE (LEEP) with ECC;  Surgeon: Cathlyn JAYSON Nikki Bobie FORBES, MD;  Location: [facility];  Service: Gynecology;  Laterality: N/A;  with ECC   LEEP     2021   LEEP  2006   RHINOPLASTY  02/2018   TUBAL LIGATION Bilateral 2006    Family History  Adopted: Yes  Problem Relation Age of Onset   Drug abuse Mother        was a prostitute   Bipolar disorder Mother    Bipolar disorder Son    Dementia Maternal Aunt    Cancer Maternal Aunt    Cervical cancer Maternal Aunt    Cancer Maternal Aunt    ADD / ADHD Maternal Uncle    Stroke Maternal Uncle    Heart attack Maternal Uncle     Social History   Socioeconomic History   Marital status: Media planner    Spouse name: Not on file   Number of children: Not on file   Years of education: Not on file   Highest education level: Bachelor's degree (e.g., BA, AB, BS)  Occupational History   Not on file  Tobacco Use   Smoking status: Former  Current packs/day: 0.50    Average packs/day: 0.5 packs/day for 25.0 years (12.5 ttl pk-yrs)    Types: Cigarettes    Start date: 01/26/2021   Smokeless tobacco: Never  Vaping Use   Vaping status: Every Day  Substance and Sexual Activity   Alcohol use: No   Drug use: No   Sexual activity: Yes    Birth control/protection: Surgical  Other Topics Concern   Not on file  Social History Narrative   Adopted by her great aunt on her mother's side   Has a half sister with her biological mom   Works as an Engineer, civil (consulting)   Lives with her boyfriend   2003- son Camellia   2006- Daughter [name]   Enjoys building furniture/arts and crafts/fishing   Completed bachelor's degree   2 [employer]pher[employer]   Social Drivers of Corporate investment banker Strain: Low Risk  (02/27/2024)   Overall Financial  Resource Strain (CARDIA)    Difficulty of Paying Living Expenses: Not hard at all  Food Insecurity: No Food Insecurity (02/27/2024)   Hunger Vital Sign    Worried About Running Out of Food in the Last Year: Never true    Ran Out of Food in the Last Year: Never true  Transportation Needs: No Transportation Needs (02/27/2024)   PRAPARE - Administrator, Civil Service (Medical): No    Lack of Transportation (Non-Medical): No  Physical Activity: Sufficiently Active (02/27/2024)   Exercise Vital Sign    Days of Exercise per Week: 5 days    Minutes of Exercise per Session: 50 min  Stress: Stress Concern Present (02/27/2024)   [employer] of Occupational Health - Occupational Stress Questionnaire    Feeling of Stress : To some extent  Social Connections: Socially Isolated (02/27/2024)   Social Connection and Isolation Panel    Frequency of Communication with Friends and Family: More than three times a week    Frequency of Social Gatherings with Friends and Family: Twice a week    Attends Religious Services: Never    Database administrator or Organizations: No    Attends Engineer, structural: Not on file    Marital Status: Divorced  Intimate Partner Violence: Unknown (01/29/2022)   Received from [facility]   HITS    Physically Hurt: Not on file    Insult or Talk Down To: Not on file    Threaten Physical Harm: Not on file    Scream or Curse: Not on file    Outpatient Medications Prior to Visit  Medication Sig Dispense Refill   amphetamine -dextroamphetamine  (ADDERALL XR) 20 MG 24 hr capsule Take 2 capsules (40 mg total) by mouth daily. 60 capsule 0   busPIRone  (BUSPAR ) 7.5 MG tablet Take 1 tablet (7.5 mg total) by mouth 2 (two) times daily. 180 tablet 0   Cholecalciferol (VITAMIN D3) 75 MCG (3000 UT) TABS Take 1 tablet by mouth daily. 30 tablet    famotidine  (PEPCID ) 20 MG tablet Take 1 tablet (20 mg total) by mouth 2 (two) times daily. 60 tablet 1   fluticasone   (FLONASE ) 50 MCG/ACT nasal spray Place 1 spray into both nostrils 2 (two) times daily. 16 g 3   folic acid  (FOLVITE ) 1 MG tablet Take 1 tablet (1 mg total) by mouth daily.     gabapentin  (NEURONTIN ) 300 MG capsule Take 1 capsule (300 mg total) by mouth at bedtime. 90 capsule 1   ibuprofen  (ADVIL ) 800 MG tablet Take 1 tablet (  800 mg total) by mouth every 8 (eight) hours as needed. 30 tablet 0   L-Tyrosine 1000 MG TABS Take by mouth.     Magnesium Stearate POWD Take 1,000 mg by mouth daily.     ondansetron  (ZOFRAN ) 4 MG tablet Take 1 tablet (4 mg total) by mouth every 8 (eight) hours as needed for nausea or vomiting. 20 tablet 0   Plecanatide  (TRULANCE ) 3 MG TABS Take 1 tablet (3 mg total) by mouth daily. 90 tablet 1   SUMAtriptan  (IMITREX ) 100 MG tablet Take 1 tablet as needed.  May repeat after 2 hours.  Maximum 2 tablets in 24 hours. 10 tablet 5   topiramate  (TOPAMAX ) 25 MG tablet Take 1 tablet (25 mg total) by mouth at bedtime. 90 tablet 1   vitamin B-12 (CYANOCOBALAMIN ) 100 MCG tablet Take 100 mcg by mouth daily.     No facility-administered medications prior to visit.    Allergies  Allergen Reactions   Lexapro [Escitalopram] Hives   Penicillins Hives   Benadryl [Diphenhydramine] Rash   Codeine Itching   Prilosec Barbara.Buttery ]     Severe diarrhea    Review of Systems  Genitourinary:  Positive for dysuria.  Skin:  Positive for rash.       Objective:    Physical Exam Constitutional:      Appearance: Normal appearance.  HENT:     Head: Normocephalic and atraumatic.  Pulmonary:     Effort: Pulmonary effort is normal.  Skin:    Comments: Patchy erythematous rash noted right  noted right upper extremity and right upper back  Neurological:     Mental Status: She is alert.       BP 115/62 (BP Location: Right Arm, Patient Position: Sitting, Cuff Size: Normal)   Pulse 98   Temp 98.5 F (36.9 C) (Oral)   Resp 16   Ht 5' 4 (1.626 m)   Wt 190 lb (86.2 kg)   SpO2 99%    BMI 32.61 kg/m  Wt Readings from Last 3 Encounters:  04/28/24 190 lb (86.2 kg)  03/11/24 191 lb (86.6 kg)  02/27/24 191 lb (86.6 kg)       Assessment & Plan:   Problem List Items Addressed This Visit       Unprioritized   Skin rash - Primary   Allergic dermatitis.  Will rx with depomedrol 80 mg IM here to be given prior to leaving. This will be followed by prednisone  taper.  Continue allegra 180mg  PO daily. Call if symptoms worsen or if not improved in 1 week.       Relevant Medications   predniSONE  (DELTASONE ) 10 MG tablet   Dysuria    - Send urine for culture. - Initiate appropriate antibiotics if culture is positive.      Relevant Orders   POCT Urinalysis Dipstick (Automated) (Completed)   Urine Culture    I am having Barbara Barr start on predniSONE . I am also having her maintain her fluticasone , Vitamin D3, folic acid , Magnesium Stearate (Bulk), L-Tyrosine, ondansetron , famotidine , gabapentin , busPIRone , Trulance , vitamin B-12, SUMAtriptan , topiramate , amphetamine -dextroamphetamine , and ibuprofen . We administered methylPREDNISolone  acetate.  Meds ordered this encounter  Medications   predniSONE  (DELTASONE ) 10 MG tablet    Sig: Take 4 tablets (40 mg total) by mouth daily for 2 days, THEN 3 tablets (30 mg total) daily for 2 days, THEN 2 tablets (20 mg total) daily for 2 days, THEN 1 tablet (10 mg total) daily for 2 days.    Dispense:  20 tablet    Refill:  0    Supervising Provider:   DOMENICA BLACKBIRD A [[id number]]   methylPREDNISolone  acetate (DEPO-MEDROL ) injection 40 mg

## 2024-04-28 NOTE — Assessment & Plan Note (Signed)
 Allergic dermatitis.  Will rx with depomedrol 80 mg IM here to be given prior to leaving. This will be followed by prednisone  taper.  Continue allegra 180mg  PO daily. Call if symptoms worsen or if not improved in 1 week.

## 2024-04-28 NOTE — Assessment & Plan Note (Signed)
-   Send urine for culture. - Initiate appropriate antibiotics if culture is positive.

## 2024-04-29 LAB — URINE CULTURE
MICRO NUMBER:: 16652020
SPECIMEN QUALITY:: ADEQUATE

## 2024-04-30 ENCOUNTER — Ambulatory Visit: Payer: Self-pay | Admitting: Family

## 2024-04-30 DIAGNOSIS — N3000 Acute cystitis without hematuria: Secondary | ICD-10-CM

## 2024-04-30 MED ORDER — NITROFURANTOIN MONOHYD MACRO 100 MG PO CAPS: 100.0000 mg | ORAL_CAPSULE | Freq: Two times a day (BID) | ORAL | 0 refills | Status: DC

## 2024-05-17 ENCOUNTER — Encounter: Payer: Self-pay | Admitting: Family

## 2024-05-17 ENCOUNTER — Other Ambulatory Visit: Payer: Self-pay | Admitting: Family

## 2024-05-17 DIAGNOSIS — F909 Attention-deficit hyperactivity disorder, unspecified type: Secondary | ICD-10-CM

## 2024-05-17 MED ORDER — AMPHETAMINE-DEXTROAMPHET ER 20 MG PO CP24: 40.0000 mg | ORAL_CAPSULE | Freq: Every day | ORAL | 0 refills | Status: DC

## 2024-05-17 NOTE — Telephone Encounter (Signed)
 Requesting: Adderall XR 20mg   Contract: 02/27/24 UDS: 02/27/24 Last Visit: 04/28/24 Next Visit: None Last Refill: 04/05/24 #60 and 0RF   Please Advise

## 2024-05-20 ENCOUNTER — Inpatient Hospital Stay (HOSPITAL_BASED_OUTPATIENT_CLINIC_OR_DEPARTMENT_OTHER): Admission: RE | Admit: 2024-05-20 | Source: Ambulatory Visit

## 2024-06-18 ENCOUNTER — Ambulatory Visit: Admitting: Family

## 2024-06-18 VITALS — BP 114/63 | HR 102 | Temp 98.3°F | Resp 16 | Ht 64.0 in | Wt 187.0 lb

## 2024-06-18 DIAGNOSIS — R09A2 Foreign body sensation, throat: Secondary | ICD-10-CM

## 2024-06-18 DIAGNOSIS — G4700 Insomnia, unspecified: Secondary | ICD-10-CM

## 2024-06-18 DIAGNOSIS — R739 Hyperglycemia, unspecified: Secondary | ICD-10-CM | POA: Diagnosis not present

## 2024-06-18 DIAGNOSIS — F909 Attention-deficit hyperactivity disorder, unspecified type: Secondary | ICD-10-CM

## 2024-06-18 DIAGNOSIS — R252 Cramp and spasm: Secondary | ICD-10-CM | POA: Diagnosis not present

## 2024-06-18 DIAGNOSIS — K589 Irritable bowel syndrome without diarrhea: Secondary | ICD-10-CM

## 2024-06-18 DIAGNOSIS — G43109 Migraine with aura, not intractable, without status migrainosus: Secondary | ICD-10-CM

## 2024-06-18 DIAGNOSIS — F419 Anxiety disorder, unspecified: Secondary | ICD-10-CM

## 2024-06-18 DIAGNOSIS — Z1211 Encounter for screening for malignant neoplasm of colon: Secondary | ICD-10-CM

## 2024-06-18 DIAGNOSIS — R87611 Atypical squamous cells cannot exclude high grade squamous intraepithelial lesion on cytologic smear of cervix (ASC-H): Secondary | ICD-10-CM

## 2024-06-18 DIAGNOSIS — K625 Hemorrhage of anus and rectum: Secondary | ICD-10-CM

## 2024-06-18 DIAGNOSIS — E538 Deficiency of other specified B group vitamins: Secondary | ICD-10-CM

## 2024-06-18 DIAGNOSIS — R5383 Other fatigue: Secondary | ICD-10-CM

## 2024-06-18 DIAGNOSIS — J45909 Unspecified asthma, uncomplicated: Secondary | ICD-10-CM

## 2024-06-18 DIAGNOSIS — K219 Gastro-esophageal reflux disease without esophagitis: Secondary | ICD-10-CM

## 2024-06-18 MED ORDER — FAMOTIDINE 20 MG PO TABS: 20.0000 mg | ORAL_TABLET | Freq: Every day | ORAL | Status: DC | PRN

## 2024-06-18 MED ORDER — AMITRIPTYLINE HCL 10 MG PO TABS
10.0000 mg | ORAL_TABLET | Freq: Every day | ORAL | 0 refills | Status: DC
Start: 2024-06-18 — End: 2024-07-01

## 2024-06-18 NOTE — Progress Notes (Signed)
 Subjective:     Patient ID: Barbara Barr, female    DOB: 1976/05/12, 48 y.o.   MRN: [id number]  Chief Complaint  Patient presents with   Fatigue    Patient complains of having no energy   Anxiety    Patient reports increased anxiety symptoms   Insomnia    Patient reports trouble falling sleep     Anxiety Symptoms include insomnia.    Insomnia    Discussed the use of AI scribe software for clinical note transcription with the patient, who gave verbal consent to proceed.  History of Present Illness  Barbara Barr is a 48 year old female who presents with lack of energy and sleep disturbances.  She experiences significant lack of energy and motivation, leading to difficulty concentrating at work and at home. She feels overwhelmed by tasks, including those related to her upcoming grandbabies. Her sleep disturbances involve difficulty falling asleep despite feeling tired, typically falling asleep around 3 or 4 AM, and struggling to wake up for work. Melatonin and ocular migraine medication have not been effective in improving her sleep.  She denies depression, tearfulness, or irritability but lacks things to look forward to. She experiences fluctuating energy levels, which she associates with her sugar levels. She has a history of high blood sugar causing fatigue and has not checked her B12 levels recently.  Her past medical history includes sinus surgery, a normal past sleep study, and a resolved rash with prednisone  and allergy medication. She takes potassium and magnesium for leg cramps, B12 supplements for a gene mutation (Compound heterozygous MTHFR) , and Trulance  for IBS symptoms. Her reflux is managed with Pepcid , and she avoids aggravating foods. She is under neurologist care for migraines, taking Topamax  and Imitrex . Her asthma is mild and managed with albuterol . She takes Adderall for focus, which is less effective, possibly due to sleep issues.     Health  Maintenance Due  Topic Date Due   Pneumococcal Vaccine (1 of 2 - PCV) Never done   Hepatitis B Vaccines 19-59 Average Risk (1 of 3 - 19+ 3-dose series) Never done   Colonoscopy  Never done   Cervical Cancer Screening (HPV/Pap Cotest)  10/31/2023   INFLUENZA VACCINE  05/28/2024    Past Medical History:  Diagnosis Date   ADHD (attention deficit hyperactivity disorder)    Allergic rhinitis, seasonal    Anemia    Anxiety    B12 deficiency 10/31/2021   Cervical dysplasia    high grade   Environmental allergies    Headache    History of gestational diabetes    History of tobacco abuse 04/07/2018   Obsessive-compulsive disorder    PONV (postoperative nausea and vomiting)    Pre-diabetes    Wears glasses     Past Surgical History:  Procedure Laterality Date   COLPOSCOPY N/A 05/12/2018   Procedure: COLPOSCOPY;  Surgeon: Cathlyn JAYSON Nikki Bobie FORBES, MD;  Location: [facility];  Service: Gynecology;  Laterality: N/A;   LAPAROSCOPIC CHOLECYSTECTOMY  2003   LEEP N/A 05/12/2018   Procedure: LOOP ELECTROSURGICAL EXCISION PROCEDURE (LEEP) with ECC;  Surgeon: Cathlyn JAYSON Nikki Bobie FORBES, MD;  Location: [facility];  Service: Gynecology;  Laterality: N/A;  with ECC   LEEP     2021   LEEP  2006   RHINOPLASTY  02/2018   TUBAL LIGATION Bilateral 2006    Family History  Adopted: Yes  Problem Relation Age of Onset   Drug abuse  Mother        was a prostitute   Bipolar disorder Mother    Bipolar disorder Son    Dementia Maternal Aunt    Cancer Maternal Aunt    Cervical cancer Maternal Aunt    Cancer Maternal Aunt    ADD / ADHD Maternal Uncle    Stroke Maternal Uncle    Heart attack Maternal Uncle     Social History   Socioeconomic History   Marital status: Media planner    Spouse name: Not on file   Number of children: Not on file   Years of education: Not on file   Highest education level: Bachelor's degree (e.g., BA, AB, BS)  Occupational  History   Not on file  Tobacco Use   Smoking status: Former    Current packs/day: 0.50    Average packs/day: 0.5 packs/day for 25.0 years (12.5 ttl pk-yrs)    Types: Cigarettes    Start date: 01/26/2021   Smokeless tobacco: Never  Vaping Use   Vaping status: Every Day  Substance and Sexual Activity   Alcohol use: No   Drug use: No   Sexual activity: Yes    Birth control/protection: Surgical  Other Topics Concern   Not on file  Social History Narrative   Adopted by her great aunt on her mother's side   Has a half sister with her biological mom   Works as an Engineer, civil (consulting)   Lives with her boyfriend   2003- son Camellia   2006- Daughter [name]   Enjoys building furniture/arts and crafts/fishing   Completed bachelor's degree   2 [employer]   Social Drivers of Corporate investment banker Strain: Low Risk  (02/27/2024)   Overall Financial Resource Strain (CARDIA)    Difficulty of Paying Living Expenses: Not hard at all  Food Insecurity: No Food Insecurity (02/27/2024)   Hunger Vital Sign    Worried About Running Out of Food in the Last Year: Never true    Ran Out of Food in the Last Year: Never true  Transportation Needs: No Transportation Needs (02/27/2024)   PRAPARE - Administrator, Civil Service (Medical): No    Lack of Transportation (Non-Medical): No  Physical Activity: Sufficiently Active (02/27/2024)   Exercise Vital Sign    Days of Exercise per Week: 5 days    Minutes of Exercise per Session: 50 min  Stress: Stress Concern Present (02/27/2024)   [employer] of Occupational Health - Occupational Stress Questionnaire    Feeling of Stress : To some extent  Social Connections: Socially Isolated (02/27/2024)   Social Connection and Isolation Panel    Frequency of Communication with Friends and Family: More than three times a week    Frequency of Social Gatherings with Friends and Family: Twice a week    Attends Religious Services: Never    Automotive engineer or Organizations: No    Attends Engineer, structural: Not on file    Marital Status: Divorced  Intimate Partner Violence: Unknown (01/29/2022)   Received from [facility]   HITS    Physically Hurt: Not on file    Insult or Talk Down To: Not on file    Threaten Physical Harm: Not on file    Scream or Curse: Not on file    Outpatient Medications Prior to Visit  Medication Sig Dispense Refill   amphetamine -dextroamphetamine  (ADDERALL XR) 20 MG 24 hr capsule Take 2 capsules (40 mg total) by  mouth daily. 60 capsule 0   busPIRone  (BUSPAR ) 7.5 MG tablet Take 1 tablet (7.5 mg total) by mouth 2 (two) times daily. 180 tablet 0   Cholecalciferol (VITAMIN D3) 75 MCG (3000 UT) TABS Take 1 tablet by mouth daily. 30 tablet    fluticasone  (FLONASE ) 50 MCG/ACT nasal spray Place 1 spray into both nostrils 2 (two) times daily. 16 g 3   folic acid  (FOLVITE ) 1 MG tablet Take 1 tablet (1 mg total) by mouth daily.     gabapentin  (NEURONTIN ) 300 MG capsule Take 1 capsule (300 mg total) by mouth at bedtime. 90 capsule 1   ibuprofen  (ADVIL ) 800 MG tablet TAKE 1 TABLET BY MOUTH EVERY 8 HOURS AS NEEDED 30 tablet 0   L-Tyrosine 1000 MG TABS Take by mouth.     Magnesium Stearate POWD Take 1,000 mg by mouth daily.     ondansetron  (ZOFRAN ) 4 MG tablet Take 1 tablet (4 mg total) by mouth every 8 (eight) hours as needed for nausea or vomiting. 20 tablet 0   Plecanatide  (TRULANCE ) 3 MG TABS Take 1 tablet (3 mg total) by mouth daily. 90 tablet 1   SUMAtriptan  (IMITREX ) 100 MG tablet Take 1 tablet as needed.  May repeat after 2 hours.  Maximum 2 tablets in 24 hours. 10 tablet 5   topiramate  (TOPAMAX ) 25 MG tablet Take 1 tablet (25 mg total) by mouth at bedtime. 90 tablet 1   vitamin B-12 (CYANOCOBALAMIN ) 100 MCG tablet Take 100 mcg by mouth daily.     famotidine  (PEPCID ) 20 MG tablet Take 1 tablet (20 mg total) by mouth 2 (two) times daily. 60 tablet 1   nitrofurantoin , macrocrystal-monohydrate,  (MACROBID ) 100 MG capsule Take 1 capsule (100 mg total) by mouth 2 (two) times daily. 10 capsule 0   No facility-administered medications prior to visit.    Allergies  Allergen Reactions   Lexapro [Escitalopram] Hives   Penicillins Hives   Benadryl [Diphenhydramine] Rash   Codeine Itching   Prilosec [Omeprazole ]     Severe diarrhea    Review of Systems  Psychiatric/Behavioral:  The patient has insomnia.    See HPI    Objective:    Physical Exam Constitutional:      General: She is not in acute distress.    Appearance: Normal appearance. She is well-developed.  HENT:     Head: Normocephalic and atraumatic.     Right Ear: External ear normal.     Left Ear: External ear normal.  Eyes:     General: No scleral icterus. Neck:     Thyroid: No thyromegaly.  Cardiovascular:     Rate and Rhythm: Normal rate and regular rhythm.     Heart sounds: Normal heart sounds. No murmur heard. Pulmonary:     Effort: Pulmonary effort is normal. No respiratory distress.     Breath sounds: Normal breath sounds. No wheezing.  Musculoskeletal:     Cervical back: Neck supple.  Skin:    General: Skin is warm and dry.  Neurological:     Mental Status: She is alert and oriented to person, place, and time.  Psychiatric:        Mood and Affect: Mood normal.        Behavior: Behavior normal.        Thought Content: Thought content normal.        Judgment: Judgment normal.      BP 114/63 (BP Location: Right Arm, Patient Position: Sitting, Cuff Size: Normal)   Pulse ROLLEN)  102   Temp 98.3 F (36.8 C) (Oral)   Resp 16   Ht 5' 4 (1.626 m)   Wt 187 lb (84.8 kg)   SpO2 98%   BMI 32.10 kg/m  Wt Readings from Last 3 Encounters:  06/18/24 187 lb (84.8 kg)  04/28/24 190 lb (86.2 kg)  03/11/24 191 lb (86.6 kg)       Assessment & Plan:   Problem List Items Addressed This Visit       Unprioritized   Rectal bleeding   No further rectal bleeding noted but still hasn't scheduled  colonoscopy. Encouraged her to schedule.      Pap smear of cervix with ASCUS, cannot exclude HGSIL   She will reschedule GYN exam.       Muscle cramping   Relevant Orders   Magnesium   Migraine with aura   Stable on topamax  and prn imitrex , follows with neurology, Dr. Skeet.       Relevant Medications   amitriptyline  (ELAVIL ) 10 MG tablet   Insomnia   Uncontrolled. Will add low dose elavil  trial.       Relevant Medications   amitriptyline  (ELAVIL ) 10 MG tablet   IBS (irritable bowel syndrome)   Stable on Trulance .       Relevant Medications   famotidine  (PEPCID ) 20 MG tablet   Hyperglycemia - Primary   Update A1C.       Relevant Orders   Basic Metabolic Panel (BMET)   HgB A1c   Gastroesophageal reflux disease   Stable on once daily pepcid  prn.        Relevant Medications   famotidine  (PEPCID ) 20 MG tablet   B12 deficiency   Update b12 level.       Attention deficit hyperactivity disorder (ADHD)   Maintained on adderall, poor focus perhaps due to poor sleep.        Asthma   Stable on prn albuterol .       Anxiety   Appears stable on buspar .       Relevant Medications   amitriptyline  (ELAVIL ) 10 MG tablet   Other Visit Diagnoses       Screening for colon cancer       Relevant Orders   Ambulatory referral to Gastroenterology     Fatigue, unspecified type       Relevant Orders   B12   CBC with Differential/Platelet   TSH     Globus sensation       Relevant Medications   famotidine  (PEPCID ) 20 MG tablet     Resolved after rx with pepcid .   I have discontinued Quanna Wittke A. Dubas's nitrofurantoin  (macrocrystal-monohydrate). I have also changed her famotidine . Additionally, I am having her start on amitriptyline . Lastly, I am having her maintain her fluticasone , Vitamin D3, folic acid , Magnesium Stearate (Bulk), L-Tyrosine, ondansetron , gabapentin , busPIRone , Trulance , vitamin B-12, SUMAtriptan , topiramate , ibuprofen , and  amphetamine -dextroamphetamine .  Meds ordered this encounter  Medications   famotidine  (PEPCID ) 20 MG tablet    Sig: Take 1 tablet (20 mg total) by mouth daily as needed for heartburn or indigestion.    Supervising Provider:   DOMENICA BLACKBIRD A [[id number]]   amitriptyline  (ELAVIL ) 10 MG tablet    Sig: Take 1 tablet (10 mg total) by mouth at bedtime.    Dispense:  90 tablet    Refill:  0    Supervising Provider:   DOMENICA BLACKBIRD A [[id number]]

## 2024-06-18 NOTE — Assessment & Plan Note (Signed)
 Stable on Trulance .

## 2024-06-18 NOTE — Assessment & Plan Note (Signed)
 Update A1C

## 2024-06-18 NOTE — Assessment & Plan Note (Signed)
Stable on prn albuterol.

## 2024-06-18 NOTE — Assessment & Plan Note (Signed)
 Stable on topamax  and prn imitrex , follows with neurology, Dr. Skeet.

## 2024-06-18 NOTE — Assessment & Plan Note (Signed)
 No further rectal bleeding noted but still hasn't scheduled colonoscopy. Encouraged her to schedule.

## 2024-06-18 NOTE — Assessment & Plan Note (Signed)
 She will reschedule GYN exam.

## 2024-06-18 NOTE — Patient Instructions (Signed)
 VISIT SUMMARY:  During your visit, we discussed your lack of energy and sleep disturbances. We also reviewed your current medications and health conditions, including ADHD, migraines, GERD, IBS, and asthma.  YOUR PLAN:  INSOMNIA AND FATIGUE: You have chronic insomnia with difficulty falling asleep, which may be contributing to your fatigue. -Start taking amitriptyline  at bedtime to help with sleep. -We will check your potassium, magnesium, B12, blood count, thyroid level, and A1c.  POSSIBLE DEPRESSION: You may have mild depression contributing to your fatigue and lack of motivation, although you do not feel sad or tearful. -Monitor your mood and energy levels. -We will evaluate how you respond to amitriptyline  to see if it helps improve your mood.  ATTENTION-DEFICIT HYPERACTIVITY DISORDER (ADHD): Your current medication, Adderall, is not providing optimal focus, possibly due to your sleep issues. -We will focus on improving your sleep to see if it helps with your ADHD symptoms.  MIGRAINE DISORDER: Your migraines are well-controlled with Topamax  and Imitrex . -Continue taking Topamax  and Imitrex  as prescribed.  GASTROESOPHAGEAL REFLUX DISEASE (GERD): Your GERD symptoms are managed with Pepcid , but certain foods can aggravate it. -Continue taking Pepcid  as needed. -Avoid foods that aggravate your symptoms.  IRRITABLE BOWEL SYNDROME (IBS): Your IBS is managed with Trulance . -Continue taking Trulance  as prescribed.  ASTHMA: Your asthma symptoms occur with early day exposure or extreme heat and are managed with albuterol . -Check the expiration date on your albuterol  inhaler.

## 2024-06-18 NOTE — Assessment & Plan Note (Signed)
 Appears stable on buspar .

## 2024-06-18 NOTE — Assessment & Plan Note (Signed)
 Stable on once daily pepcid  prn.

## 2024-06-18 NOTE — Assessment & Plan Note (Signed)
 Maintained on adderall, poor focus perhaps due to poor sleep.

## 2024-06-18 NOTE — Assessment & Plan Note (Signed)
 Uncontrolled. Will add low dose elavil  trial.

## 2024-06-18 NOTE — Assessment & Plan Note (Signed)
Update b12 level. 

## 2024-06-19 ENCOUNTER — Telehealth: Payer: Self-pay | Admitting: Family

## 2024-06-19 DIAGNOSIS — E119 Type 2 diabetes mellitus without complications: Secondary | ICD-10-CM | POA: Insufficient documentation

## 2024-06-19 LAB — CBC WITH DIFFERENTIAL/PLATELET
Absolute Lymphocytes: 2355 {cells}/uL (ref 850–3900)
Absolute Monocytes: 587 {cells}/uL (ref 200–950)
Basophils Absolute: 43 {cells}/uL (ref 0–200)
Basophils Relative: 0.5 %
Eosinophils Absolute: 111 {cells}/uL (ref 15–500)
Eosinophils Relative: 1.3 %
HCT: 41.5 % (ref 35.0–45.0)
Hemoglobin: 13.5 g/dL (ref 11.7–15.5)
MCH: 28.1 pg (ref 27.0–33.0)
MCHC: 32.5 g/dL (ref 32.0–36.0)
MCV: 86.3 fL (ref 80.0–100.0)
MPV: 10.2 fL (ref 7.5–12.5)
Monocytes Relative: 6.9 %
Neutro Abs: 5406 {cells}/uL (ref 1500–7800)
Neutrophils Relative %: 63.6 %
Platelets: 301 Thousand/uL (ref 140–400)
RBC: 4.81 Million/uL (ref 3.80–5.10)
RDW: 12.4 % (ref 11.0–15.0)
Total Lymphocyte: 27.7 %
WBC: 8.5 Thousand/uL (ref 3.8–10.8)

## 2024-06-19 LAB — BASIC METABOLIC PANEL WITH GFR
BUN: 13 mg/dL (ref 7–25)
CO2: 27 mmol/L (ref 20–32)
Calcium: 9.6 mg/dL (ref 8.6–10.2)
Chloride: 103 mmol/L (ref 98–110)
Creat: 0.74 mg/dL (ref 0.50–0.99)
Glucose, Bld: 125 mg/dL — ABNORMAL HIGH (ref 65–99)
Potassium: 3.6 mmol/L (ref 3.5–5.3)
Sodium: 140 mmol/L (ref 135–146)
eGFR: 101 mL/min/1.73m2 (ref >=60)

## 2024-06-19 LAB — MAGNESIUM: Magnesium: 1.9 mg/dL (ref 1.5–2.5)

## 2024-06-19 LAB — HEMOGLOBIN A1C
Hgb A1c MFr Bld: 6.5 % — ABNORMAL HIGH (ref <5.7)
Mean Plasma Glucose: 140 mg/dL
eAG (mmol/L): 7.7 mmol/L

## 2024-06-19 LAB — VITAMIN B12: Vitamin B-12: 352 pg/mL (ref 200–1100)

## 2024-06-19 LAB — TSH: TSH: 0.8 m[IU]/L

## 2024-06-19 NOTE — Telephone Encounter (Signed)
 See mychart.

## 2024-06-21 ENCOUNTER — Other Ambulatory Visit (HOSPITAL_COMMUNITY): Payer: Self-pay

## 2024-06-21 ENCOUNTER — Telehealth: Payer: Self-pay

## 2024-06-21 DIAGNOSIS — E119 Type 2 diabetes mellitus without complications: Secondary | ICD-10-CM

## 2024-06-21 MED ORDER — TIRZEPATIDE 2.5 MG/0.5ML ~~LOC~~ SOAJ: 2.5000 mg | SUBCUTANEOUS | 0 refills | Status: DC

## 2024-06-21 NOTE — Telephone Encounter (Signed)
 Pharmacy Patient Advocate Encounter   Received notification from Latent that prior authorization for Mounjaro  2.5MG /0.5ML auto-injectors   is required/requested.   Insurance verification completed.   The patient is insured through [facility] .   Per test claim: PA required; PA submitted to above mentioned insurance via Latent Key/confirmation #/EOC [facility] Status is pending

## 2024-06-21 NOTE — Addendum Note (Signed)
 Addended by: DARYL SETTER on: 06/21/2024 12:54 PM   Modules accepted: Orders

## 2024-06-22 ENCOUNTER — Encounter: Payer: Self-pay | Admitting: Family

## 2024-06-22 DIAGNOSIS — G4700 Insomnia, unspecified: Secondary | ICD-10-CM

## 2024-06-22 DIAGNOSIS — F909 Attention-deficit hyperactivity disorder, unspecified type: Secondary | ICD-10-CM

## 2024-06-23 ENCOUNTER — Encounter: Payer: Self-pay | Admitting: Family

## 2024-06-23 ENCOUNTER — Other Ambulatory Visit: Payer: Self-pay | Admitting: Family

## 2024-06-23 DIAGNOSIS — E119 Type 2 diabetes mellitus without complications: Secondary | ICD-10-CM

## 2024-06-23 MED ORDER — AMPHETAMINE-DEXTROAMPHET ER 20 MG PO CP24: 40.0000 mg | ORAL_CAPSULE | Freq: Every day | ORAL | 0 refills | Status: DC

## 2024-06-23 NOTE — Telephone Encounter (Signed)
 Requesting: Adderall XR 20mg   Contract: 02/27/24 UDS: 02/27/24 Last Visit: 06/18/24 Next Visit: None Last Refill: 05/17/24 #60 and 0RF   Please Advise

## 2024-06-24 ENCOUNTER — Other Ambulatory Visit (HOSPITAL_COMMUNITY): Payer: Self-pay

## 2024-06-24 NOTE — Telephone Encounter (Signed)
 Pharmacy Patient Advocate Encounter  Received notification from [facility] that Prior Authorization for Mounjaro  2.5MG /0.5ML auto-injectorshas been DENIED.  Full denial letter will be uploaded to the media tab. See denial reason below. Spoke to plan and was told even though it states that pa is Type 2 diabetes it may not be enough notes that PT was diagnosis for PA. If you disagree  with OUTCOME we may start an APPEAL.    PA #/Case ID/Reference #: [id number]

## 2024-06-25 NOTE — Telephone Encounter (Signed)
 Appeal will be initiated by PA team

## 2024-06-30 ENCOUNTER — Telehealth: Payer: Self-pay | Admitting: Pharmacist

## 2024-06-30 NOTE — Telephone Encounter (Signed)
 Appeal has been submitted. Will advise when response is received or follow up in 1 week. Please be advised that most companies may take 30 days to make a decision.  A pending PA Appeal for the patient/medication combination exists; If you need to make any changes to pending request please contact [phone].

## 2024-06-30 NOTE — Telephone Encounter (Signed)
 ERROR

## 2024-07-01 ENCOUNTER — Other Ambulatory Visit (HOSPITAL_BASED_OUTPATIENT_CLINIC_OR_DEPARTMENT_OTHER): Payer: Self-pay

## 2024-07-01 MED ORDER — AMPHETAMINE-DEXTROAMPHET ER 20 MG PO CP24
40.0000 mg | ORAL_CAPSULE | Freq: Every day | ORAL | 0 refills | Status: DC
  Filled 2024-07-01: qty 60, 30d supply, fill #0

## 2024-07-01 MED ORDER — AMITRIPTYLINE HCL 10 MG PO TABS
10.0000 mg | ORAL_TABLET | Freq: Every day | ORAL | 0 refills | Status: AC
  Filled 2024-07-01: qty 90, 90d supply, fill #0

## 2024-07-01 NOTE — Addendum Note (Signed)
 Addended by: DARYL SETTER on: 07/01/2024 01:27 PM   Modules accepted: Orders

## 2024-07-01 NOTE — Telephone Encounter (Signed)
 Can you please cancel [facility]derall rx at [facility]? tks

## 2024-07-02 ENCOUNTER — Ambulatory Visit: Admitting: Family

## 2024-07-02 ENCOUNTER — Ambulatory Visit: Payer: Self-pay

## 2024-07-02 VITALS — BP 127/81 | HR 113 | Temp 98.6°F | Resp 16 | Ht 64.0 in | Wt 187.0 lb

## 2024-07-02 DIAGNOSIS — E119 Type 2 diabetes mellitus without complications: Secondary | ICD-10-CM | POA: Diagnosis not present

## 2024-07-02 MED ORDER — FREESTYLE LIBRE 3 PLUS SENSOR MISC: 5 refills | Status: DC

## 2024-07-02 NOTE — Assessment & Plan Note (Addendum)
 Lab Results  Component Value Date   HGBA1C 6.5 (H) 06/18/2024    Elevated blood glucose at 389 mg/dL. Has been experiencing headache and fatigue as a result. Previous metformin regimen ineffective. Insurance issues delaying Mounjaro  approval. - fax additional requested documents for PA to insurance. - [facility] Freestyle libre plus [facility]. - Discuss potential out-of-pocket cost for Freestyle with coupon reducing cost to $75/month. - Encourage adherence to diet and exercise. -Follow up as scheduled

## 2024-07-02 NOTE — Telephone Encounter (Signed)
 FYI Only or Action Required?: Action required by provider: request for documentation or forms.  Patient was last seen in primary care on 06/18/2024 by Daryl Setter, NP.  Called Nurse Triage reporting Blood Sugar Problem.  Symptoms began  .  Interventions attempted: Dietary changes.  Symptoms are: gradually worsening.  Triage Disposition: See Physician Within 4 Hours (or PCP Triage) (overriding Call PCP Now)  Patient/caregiver understands and will follow disposition?: Yes   Copied from CRM #[id number]. Topic: Clinical - Red Word Triage >> Jul 02, 2024 12:51 PM Jayma L wrote: Red Word that prompted transfer to Nurse Triage: patient called in stated she was having blood sugar readings of 300 in the morning and a hour after she eats its 300-400, said she has been off sugars for a while now and she's still having issues.   When patient first called in she was on line with [employer] and I sent back a medical question to pcp for medical records and diagnosis to be seen to [facility] to prove her type 2 diabetes. The insurance is refusing her medicine refills due to not having her type of diabetes on paperwork so its high due to not having her medicine. Reason for Disposition  [1] Blood glucose > 300 mg/dL (83.2 mmol/L) AND [7] two or more times in a row  Answer Assessment - Initial Assessment Questions Additional info: 1) PAS sent message with details needed for [employer]. Cleora would like to add that you write expedite on diagnosis paperwork, per [employer] if expedited they can approve her today.   2) Scheduled follow up with pcp today.    1. BLOOD GLUCOSE: What is your blood glucose level?      Mornings 220-280, daytime up to 360. This morning 323 2. ONSET: When did you check the blood glucose?     This morning 3. USUAL RANGE: What is your glucose level usually? (e.g., usual fasting morning value, usual evening value)      4. KETONES: Do you check for ketones (urine or blood  test strips)? If Yes, ask: What does the test show now?       5. TYPE 1 or 2:  Do you know what type of diabetes you have?  (e.g., Type 1, Type 2, Gestational; doesn't know)      2 6. INSULIN: Do you take insulin? What type of insulin(s) do you use? What is the mode of delivery? (syringe, pen; injection or pump)?      no 7. DIABETES PILLS: Do you take any pills for your diabetes? If Yes, ask: Have you missed taking any pills recently?     No longer on metformin 8. OTHER SYMPTOMS: Do you have any symptoms? (e.g., fever, frequent urination, difficulty breathing, dizziness, weakness, vomiting)     Fatigue, mild headache, increased thirst, increased urination  Protocols used: Diabetes - High Blood Sugar-A-AH

## 2024-07-02 NOTE — Patient Instructions (Addendum)
 VISIT SUMMARY:  Today, we discussed your elevated blood sugar levels and the challenges you are facing with your current diabetes management plan. We also addressed your intermittent head pain and ongoing efforts to manage your weight.  YOUR PLAN:  TYPE 2 DIABETES MELLITUS: Your blood sugar levels are high, and your current medication, metformin, is not effective. We are working on getting approval for Mounjaro  and a continuous glucose monitor. -Complete prior authorization for Mounjaro  and fax to insurance. -[facility]. -Discuss potential out-of-pocket cost for Freestyle with coupon reducing cost to $75/month. -Encourage adherence to diet and exercise.  HEAD PAIN: You have mild head pressure that may be related to high blood sugar levels. Ibuprofen  helps, but Tylenol  does not. -Hopefully this will improve as your blood sugar improves.  OBESITY: Your BMI is still high despite following a diet and consulting with a nutritionist. -Continue dietary management and consider a follow-up with a nutritionist after blood sugar control is achieved.

## 2024-07-02 NOTE — Telephone Encounter (Signed)
 Appt scheduled

## 2024-07-02 NOTE — Progress Notes (Signed)
 ;;  Subjective:     Patient ID: Barbara Barr, female    DOB: 1976/07/15, 48 y.o.   MRN: [id number]  Chief Complaint  Patient presents with   Hyperglycemia    Here for follow up    Hyperglycemia    Discussed the use of AI scribe software for clinical note transcription with the patient, who gave verbal consent to proceed.  History of Present Illness  Barbara Barr is a 48 year old female with type 2 diabetes who presents with elevated blood sugar levels.  Her blood sugar level was 389 mg/dL last evening. She is unable to obtain authorization for Mounjaro  due to missing A1c lab reports and medical records required by her insurance, [employer]. She follows a low-carbohydrate, low-sugar diet, avoiding foods like bananas that spike her blood sugar. Despite these efforts, her blood sugar remains high. She previously used metformin, which is no longer effective. She experiences mild head pressure described as a 'stabbing pain' not located in her eyes, for which she takes ibuprofen . She is frustrated with the insurance process, as her BMI and A1c levels meet the criteria for medication coverage, yet approval is delayed.     Health Maintenance Due  Topic Date Due   FOOT EXAM  Never done   OPHTHALMOLOGY EXAM  Never done   Diabetic kidney evaluation - Urine ACR  Never done   Pneumococcal Vaccine (1 of 2 - PCV) Never done   Hepatitis B Vaccines 19-59 Average Risk (1 of 3 - 19+ 3-dose series) Never done   Colonoscopy  Never done   Cervical Cancer Screening (HPV/Pap Cotest)  10/31/2023   Influenza Vaccine  05/28/2024    Past Medical History:  Diagnosis Date   ADHD (attention deficit hyperactivity disorder)    Allergic rhinitis, seasonal    Anemia    Anxiety    B12 deficiency 10/31/2021   Cervical dysplasia    high grade   Environmental allergies    Headache    History of gestational diabetes    History of tobacco abuse 04/07/2018   Obsessive-compulsive  disorder    PONV (postoperative nausea and vomiting)    Pre-diabetes    Wears glasses     Past Surgical History:  Procedure Laterality Date   COLPOSCOPY N/A 05/12/2018   Procedure: COLPOSCOPY;  Surgeon: Cathlyn JAYSON Nikki Bobie FORBES, MD;  Location: [facility];  Service: Gynecology;  Laterality: N/A;   LAPAROSCOPIC CHOLECYSTECTOMY  2003   LEEP N/A 05/12/2018   Procedure: LOOP ELECTROSURGICAL EXCISION PROCEDURE (LEEP) with ECC;  Surgeon: Cathlyn JAYSON Nikki Bobie FORBES, MD;  Location: [facility];  Service: Gynecology;  Laterality: N/A;  with ECC   LEEP     2021   LEEP  2006   RHINOPLASTY  02/2018   TUBAL LIGATION Bilateral 2006    Family History  Adopted: Yes  Problem Relation Age of Onset   Drug abuse Mother        was a prostitute   Bipolar disorder Mother    Bipolar disorder Son    Dementia Maternal Aunt    Cancer Maternal Aunt    Cervical cancer Maternal Aunt    Cancer Maternal Aunt    ADD / ADHD Maternal Uncle    Stroke Maternal Uncle    Heart attack Maternal Uncle     Social History   Socioeconomic History   Marital status: Media planner    Spouse name: Not on file   Number  of children: Not on file   Years of education: Not on file   Highest education level: Bachelor's degree (e.g., BA, AB, BS)  Occupational History   Not on file  Tobacco Use   Smoking status: Former    Current packs/day: 0.50    Average packs/day: 0.5 packs/day for 25.0 years (12.5 ttl pk-yrs)    Types: Cigarettes    Start date: 01/26/2021   Smokeless tobacco: Never  Vaping Use   Vaping status: Every Day  Substance and Sexual Activity   Alcohol use: No   Drug use: No   Sexual activity: Yes    Birth control/protection: Surgical  Other Topics Concern   Not on file  Social History Narrative   Adopted by her great aunt on her mother's side   Has a half sister with her biological mom   Works as an Engineer, civil (consulting)   Lives with her boyfriend   2003-  son Camellia   2006- Daughter [name]   Enjoys building furniture/arts and crafts/fishing   Completed bachelor's degree   2 [employer]pher[employer]   Social Drivers of Corporate investment banker Strain: Low Risk  (02/27/2024)   Overall Financial Resource Strain (CARDIA)    Difficulty of Paying Living Expenses: Not hard at all  Food Insecurity: No Food Insecurity (02/27/2024)   Hunger Vital Sign    Worried About Running Out of Food in the Last Year: Never true    Ran Out of Food in the Last Year: Never true  Transportation Needs: No Transportation Needs (02/27/2024)   PRAPARE - Administrator, Civil Service (Medical): No    Lack of Transportation (Non-Medical): No  Physical Activity: Sufficiently Active (02/27/2024)   Exercise Vital Sign    Days of Exercise per Week: 5 days    Minutes of Exercise per Session: 50 min  Stress: Stress Concern Present (02/27/2024)   [employer] of Occupational Health - Occupational Stress Questionnaire    Feeling of Stress : To some extent  Social Connections: Socially Isolated (02/27/2024)   Social Connection and Isolation Panel    Frequency of Communication with Friends and Family: More than three times a week    Frequency of Social Gatherings with Friends and Family: Twice a week    Attends Religious Services: Never    Database administrator or Organizations: No    Attends Engineer, structural: Not on file    Marital Status: Divorced  Intimate Partner Violence: Unknown (01/29/2022)   Received from [facility]   HITS    Physically Hurt: Not on file    Insult or Talk Down To: Not on file    Threaten Physical Harm: Not on file    Scream or Curse: Not on file    Outpatient Medications Prior to Visit  Medication Sig Dispense Refill   amitriptyline  (ELAVIL ) 10 MG tablet Take 1 tablet (10 mg total) by mouth at bedtime. 90 tablet 0   amphetamine -dextroamphetamine  (ADDERALL XR) 20 MG 24 hr capsule Take 2 capsules (40 mg total) by mouth daily.  60 capsule 0   busPIRone  (BUSPAR ) 7.5 MG tablet Take 1 tablet (7.5 mg total) by mouth 2 (two) times daily. 180 tablet 0   Cholecalciferol (VITAMIN D3) 75 MCG (3000 UT) TABS Take 1 tablet by mouth daily. 30 tablet    famotidine  (PEPCID ) 20 MG tablet Take 1 tablet (20 mg total) by mouth daily as needed for heartburn or indigestion.     fluticasone  (FLONASE ) 50  MCG/ACT nasal spray Place 1 spray into both nostrils 2 (two) times daily. 16 g 3   folic acid  (FOLVITE ) 1 MG tablet Take 1 tablet (1 mg total) by mouth daily.     gabapentin  (NEURONTIN ) 300 MG capsule Take 1 capsule (300 mg total) by mouth at bedtime. 90 capsule 1   ibuprofen  (ADVIL ) 800 MG tablet TAKE 1 TABLET BY MOUTH EVERY 8 HOURS AS NEEDED 30 tablet 0   L-Tyrosine 1000 MG TABS Take by mouth.     Magnesium Stearate POWD Take 1,000 mg by mouth daily.     ondansetron  (ZOFRAN ) 4 MG tablet Take 1 tablet (4 mg total) by mouth every 8 (eight) hours as needed for nausea or vomiting. 20 tablet 0   Plecanatide  (TRULANCE ) 3 MG TABS Take 1 tablet (3 mg total) by mouth daily. 90 tablet 1   SUMAtriptan  (IMITREX ) 100 MG tablet Take 1 tablet as needed.  May repeat after 2 hours.  Maximum 2 tablets in 24 hours. 10 tablet 5   tirzepatide  (MOUNJARO ) 2.5 MG/0.5ML Pen Inject 2.5 mg into the skin once a week. 2 mL 0   topiramate  (TOPAMAX ) 25 MG tablet Take 1 tablet (25 mg total) by mouth at bedtime. 90 tablet 1   vitamin B-12 (CYANOCOBALAMIN ) 100 MCG tablet Take 100 mcg by mouth daily.     No facility-administered medications prior to visit.    Allergies  Allergen Reactions   Lexapro [Escitalopram] Hives   Penicillins Hives   Benadryl [Diphenhydramine] Rash   Codeine Itching   Prilosec [Omeprazole ]     Severe diarrhea    ROS See HPI    Objective:    Physical Exam Constitutional:      General: She is not in acute distress.    Appearance: Normal appearance. She is well-developed.  HENT:     Head: Normocephalic and atraumatic.     Right Ear:  External ear normal.     Left Ear: External ear normal.  Eyes:     General: No scleral icterus. Neck:     Thyroid: No thyromegaly.  Cardiovascular:     Rate and Rhythm: Normal rate and regular rhythm.     Heart sounds: Normal heart sounds. No murmur heard. Pulmonary:     Effort: Pulmonary effort is normal. No respiratory distress.     Breath sounds: Normal breath sounds. No wheezing.  Musculoskeletal:     Cervical back: Neck supple.  Skin:    General: Skin is warm and dry.  Neurological:     Mental Status: She is alert and oriented to person, place, and time.  Psychiatric:        Mood and Affect: Mood normal.        Behavior: Behavior normal.        Thought Content: Thought content normal.        Judgment: Judgment normal.      BP 127/81 (BP Location: Right Arm, Patient Position: Sitting, Cuff Size: Normal)   Pulse (!) 113   Temp 98.6 F (37 C) (Oral)   Resp 16   Ht 5' 4 (1.626 m)   Wt 187 lb (84.8 kg)   SpO2 100%   BMI 32.10 kg/m  Wt Readings from Last 3 Encounters:  07/02/24 187 lb (84.8 kg)  06/18/24 187 lb (84.8 kg)  04/28/24 190 lb (86.2 kg)       Assessment & Plan:   Problem List Items Addressed This Visit       Unprioritized   Type  2 diabetes mellitus without complication, without long-term current use of insulin (HCC) - Primary    Elevated blood glucose at 389 mg/dL. Has been experiencing headache and fatigue as a result. Previous metformin regimen ineffective. Insurance issues delaying Mounjaro  approval. - fax additional requested documents for PA to insurance. - [facility] libre [facility]. - Discuss potential out-of-pocket cost for Freestyle with coupon reducing cost to $75/month. - Encourage adherence to diet and exercise. -Follow up as scheduled      Relevant Medications   Continuous Glucose Sensor (FREESTYLE LIBRE 3 PLUS SENSOR) MISC    I am having Hyde Sires A. Tremper start  on [employer]. I am also having her maintain her fluticasone , Vitamin D3, folic acid , Magnesium Stearate (Bulk), L-Tyrosine, ondansetron , gabapentin , busPIRone , Trulance , vitamin B-12, SUMAtriptan , topiramate , ibuprofen , famotidine , tirzepatide , amphetamine -dextroamphetamine , and amitriptyline .  Meds ordered this encounter  Medications   Continuous Glucose Sensor (FREESTYLE LIBRE 3 PLUS SENSOR) MISC    Sig: Change sensor every 15 days.    Dispense:  6 each    Refill:  5    Supervising Provider:   DOMENICA BLACKBIRD A [[id number]]

## 2024-07-02 NOTE — Telephone Encounter (Signed)
 Requested information has been faxed to [facility] @800 [phone] on 07/02/2024 @4 :27 pm.

## 2024-07-02 NOTE — Telephone Encounter (Signed)
 Ov note with diagnosis of DM2, HgbA1c and bmp results faxed from office as well per patient's reques. Fax confirmation received at 4:13 pm today.

## 2024-07-02 NOTE — Telephone Encounter (Signed)
 Copied from CRM #[id number]. Topic: Clinical - Medical Advice >> Jul 02, 2024 12:45 PM Jayma L wrote: Reason for CRM: tina from [employer] called with patient on line and stated we need to fax the A1C, and glucose along with office notes with diagnosis of type 2 diabetes , fax number is [phone] , pending case number on cover sheet is needed : [id number] , call [phone] , opt 3 and then opt 3 again

## 2024-07-05 ENCOUNTER — Other Ambulatory Visit (HOSPITAL_BASED_OUTPATIENT_CLINIC_OR_DEPARTMENT_OTHER): Payer: Self-pay

## 2024-07-05 ENCOUNTER — Encounter: Payer: Self-pay | Admitting: Family

## 2024-07-05 ENCOUNTER — Other Ambulatory Visit (HOSPITAL_COMMUNITY): Payer: Self-pay

## 2024-07-05 MED ORDER — TIRZEPATIDE 2.5 MG/0.5ML ~~LOC~~ SOAJ
2.5000 mg | SUBCUTANEOUS | 0 refills | Status: DC
  Filled 2024-07-05: qty 2, 28d supply, fill #0

## 2024-07-05 NOTE — Telephone Encounter (Signed)
 Pharmacy Patient Advocate Encounter  Received notification from [facility] that Prior Authorization for Mounjaro  2.5MG /0.5ML auto-injectors has been APPROVED from 06/22/2024 to 06/22/2025. Ran test claim, Copay is $25.00. This test claim was processed through [facility]- copay amounts may vary at other pharmacies due to pharmacy/plan contracts, or as the patient moves through the different stages of their insurance plan. PLEASE SEE  FULL LETTER OF APPROVAL IS SCANNED INTO CHART IN MEDIA     PA #/Case ID/Reference #: [id number]

## 2024-07-05 NOTE — Addendum Note (Signed)
 Addended by: Cherita Hebel D on: 07/05/2024 02:49 PM   Modules accepted: Orders

## 2024-07-20 ENCOUNTER — Encounter: Payer: Self-pay | Admitting: Family

## 2024-07-20 DIAGNOSIS — R09A2 Foreign body sensation, throat: Secondary | ICD-10-CM

## 2024-07-20 DIAGNOSIS — E119 Type 2 diabetes mellitus without complications: Secondary | ICD-10-CM

## 2024-07-21 ENCOUNTER — Other Ambulatory Visit: Payer: Self-pay | Admitting: Family

## 2024-07-21 ENCOUNTER — Encounter: Payer: Self-pay | Admitting: Neurology

## 2024-07-21 DIAGNOSIS — Z1231 Encounter for screening mammogram for malignant neoplasm of breast: Secondary | ICD-10-CM

## 2024-07-22 ENCOUNTER — Ambulatory Visit (INDEPENDENT_AMBULATORY_CARE_PROVIDER_SITE_OTHER)

## 2024-07-22 ENCOUNTER — Other Ambulatory Visit: Payer: Self-pay | Admitting: Family

## 2024-07-22 DIAGNOSIS — E119 Type 2 diabetes mellitus without complications: Secondary | ICD-10-CM

## 2024-07-22 DIAGNOSIS — Z1231 Encounter for screening mammogram for malignant neoplasm of breast: Secondary | ICD-10-CM

## 2024-07-22 MED ORDER — TIRZEPATIDE 2.5 MG/0.5ML ~~LOC~~ SOAJ
2.5000 mg | SUBCUTANEOUS | 1 refills | Status: DC
Start: 2024-07-22 — End: 2024-07-22

## 2024-07-22 MED ORDER — FAMOTIDINE 20 MG PO TABS: 20.0000 mg | ORAL_TABLET | Freq: Two times a day (BID) | ORAL | 2 refills | Status: DC

## 2024-07-22 NOTE — Addendum Note (Signed)
 Addended by: DARYL SETTER on: 07/22/2024 11:40 AM   Modules accepted: Orders

## 2024-07-22 NOTE — Addendum Note (Signed)
 Addended by: DARYL SETTER on: 07/22/2024 10:18 AM   Modules accepted: Orders

## 2024-07-22 NOTE — Telephone Encounter (Signed)
 Pharmacy comment: Alternative Requested:INS MAX 2 IN 180 DAYS NEEDS PA.

## 2024-07-27 ENCOUNTER — Ambulatory Visit: Payer: Self-pay | Admitting: Family

## 2024-07-28 ENCOUNTER — Other Ambulatory Visit (HOSPITAL_BASED_OUTPATIENT_CLINIC_OR_DEPARTMENT_OTHER): Payer: Self-pay

## 2024-07-28 ENCOUNTER — Other Ambulatory Visit: Payer: Self-pay

## 2024-07-28 DIAGNOSIS — E119 Type 2 diabetes mellitus without complications: Secondary | ICD-10-CM

## 2024-07-28 MED ORDER — MOUNJARO 2.5 MG/0.5ML ~~LOC~~ SOAJ
2.5000 mg | SUBCUTANEOUS | 1 refills | Status: DC
  Filled 2024-07-28: qty 2, 28d supply, fill #0
  Filled 2024-11-15: qty 2, 28d supply, fill #1

## 2024-07-28 NOTE — Telephone Encounter (Signed)
 I spoke to patient.  I do not think that going up on the the dose from 2.5mg  to 5mg  is medically appropriate and I have written a letter stating that for her insurance. Can you please reach out to the PA team to see if they can initiate an appeal with my letter?

## 2024-07-29 ENCOUNTER — Other Ambulatory Visit (HOSPITAL_BASED_OUTPATIENT_CLINIC_OR_DEPARTMENT_OTHER): Payer: Self-pay

## 2024-07-29 ENCOUNTER — Other Ambulatory Visit (HOSPITAL_COMMUNITY): Payer: Self-pay

## 2024-07-29 ENCOUNTER — Telehealth: Payer: Self-pay

## 2024-07-29 NOTE — Telephone Encounter (Signed)
 Pharmacy Patient Advocate Encounter   Received notification from [facility] [employer] that prior authorization for Mounjaro  2.5mg /0.75ml is required/requested.   Insurance verification completed.   The patient is insured through [facility].   Per test claim: PA required; PA submitted to above mentioned insurance via Latent Key/confirmation #/EOC [facility] Status is pending

## 2024-07-30 ENCOUNTER — Other Ambulatory Visit (HOSPITAL_BASED_OUTPATIENT_CLINIC_OR_DEPARTMENT_OTHER): Payer: Self-pay

## 2024-07-30 ENCOUNTER — Other Ambulatory Visit (HOSPITAL_COMMUNITY): Payer: Self-pay

## 2024-07-30 NOTE — Telephone Encounter (Signed)
 Pharmacy Patient Advocate Encounter  Received notification from [facility] that Prior Authorization for Mounjaro  2.5mg /0.97ml has been APPROVED from 07/30/24 to 10/27/24   PA #/Case ID/Reference #: [id number]  Approval letter indexed to media tab

## 2024-08-01 ENCOUNTER — Other Ambulatory Visit (HOSPITAL_BASED_OUTPATIENT_CLINIC_OR_DEPARTMENT_OTHER): Payer: Self-pay

## 2024-08-02 ENCOUNTER — Other Ambulatory Visit: Payer: Self-pay | Admitting: Family

## 2024-08-02 ENCOUNTER — Other Ambulatory Visit (HOSPITAL_BASED_OUTPATIENT_CLINIC_OR_DEPARTMENT_OTHER): Payer: Self-pay

## 2024-08-02 ENCOUNTER — Encounter: Payer: Self-pay | Admitting: Family

## 2024-08-02 ENCOUNTER — Other Ambulatory Visit: Payer: Self-pay

## 2024-08-02 DIAGNOSIS — F909 Attention-deficit hyperactivity disorder, unspecified type: Secondary | ICD-10-CM

## 2024-08-02 DIAGNOSIS — R09A2 Foreign body sensation, throat: Secondary | ICD-10-CM

## 2024-08-02 MED ORDER — AMPHETAMINE-DEXTROAMPHET ER 20 MG PO CP24
40.0000 mg | ORAL_CAPSULE | Freq: Every day | ORAL | 0 refills | Status: DC
  Filled 2024-08-02: qty 60, 30d supply, fill #0

## 2024-08-02 MED ORDER — FAMOTIDINE 20 MG PO TABS: 20.0000 mg | ORAL_TABLET | Freq: Two times a day (BID) | ORAL | Status: DC

## 2024-08-02 MED ORDER — FAMOTIDINE 20 MG PO TABS
20.0000 mg | ORAL_TABLET | Freq: Two times a day (BID) | ORAL | 1 refills | Status: AC
Start: 2024-08-02 — End: ?
  Filled 2024-08-02: qty 180, 90d supply, fill #0

## 2024-08-03 ENCOUNTER — Other Ambulatory Visit (HOSPITAL_BASED_OUTPATIENT_CLINIC_OR_DEPARTMENT_OTHER): Payer: Self-pay

## 2024-08-05 ENCOUNTER — Ambulatory Visit (INDEPENDENT_AMBULATORY_CARE_PROVIDER_SITE_OTHER): Admitting: Family Medicine

## 2024-08-05 ENCOUNTER — Other Ambulatory Visit (HOSPITAL_COMMUNITY)
Admission: RE | Admit: 2024-08-05 | Discharge: 2024-08-05 | Disposition: A | Discharge status: Home / Self Care | Source: Ambulatory Visit | Attending: Family Medicine | Admitting: Family Medicine

## 2024-08-05 ENCOUNTER — Other Ambulatory Visit (HOSPITAL_BASED_OUTPATIENT_CLINIC_OR_DEPARTMENT_OTHER): Payer: Self-pay

## 2024-08-05 ENCOUNTER — Other Ambulatory Visit: Payer: Self-pay

## 2024-08-05 ENCOUNTER — Encounter: Payer: Self-pay | Admitting: Family

## 2024-08-05 VITALS — BP 112/76 | HR 99 | Ht 64.0 in | Wt 179.0 lb

## 2024-08-05 DIAGNOSIS — E119 Type 2 diabetes mellitus without complications: Secondary | ICD-10-CM

## 2024-08-05 DIAGNOSIS — N951 Menopausal and female climacteric states: Secondary | ICD-10-CM

## 2024-08-05 DIAGNOSIS — Z87410 Personal history of cervical dysplasia: Secondary | ICD-10-CM | POA: Insufficient documentation

## 2024-08-05 DIAGNOSIS — Z01419 Encounter for gynecological examination (general) (routine) without abnormal findings: Secondary | ICD-10-CM | POA: Diagnosis not present

## 2024-08-05 DIAGNOSIS — Z7985 Long-term (current) use of injectable non-insulin antidiabetic drugs: Secondary | ICD-10-CM

## 2024-08-05 MED ORDER — FREESTYLE LIBRE 3 PLUS SENSOR MISC
5 refills | Status: AC
  Filled 2024-08-05: qty 6, 90d supply, fill #0
  Filled 2024-08-05: qty 2, 30d supply, fill #0

## 2024-08-05 MED ORDER — TRULANCE 3 MG PO TABS
3.0000 mg | ORAL_TABLET | Freq: Every day | ORAL | 2 refills | Status: AC
  Filled 2024-08-05: qty 30, 30d supply, fill #0

## 2024-08-05 MED ORDER — FREESTYLE LIBRE 3 PLUS SENSOR MISC: 5 refills | Status: AC

## 2024-08-05 MED ORDER — MOUNJARO 2.5 MG/0.5ML ~~LOC~~ SOAJ
2.5000 mg | SUBCUTANEOUS | 1 refills | Status: DC
  Filled 2024-08-05 – 2024-08-30 (×2): qty 2, 28d supply, fill #0
  Filled 2024-09-30: qty 2, 28d supply, fill #1

## 2024-08-05 NOTE — Progress Notes (Signed)
 ANNUAL EXAM Patient name: Barbara Barr MRN [id number]  Date of birth: Oct 19, 1976 Chief Complaint:   Annual Exam  History of Present Illness:   Barbara Barr is a 48 y.o.  G2P2  female  being seen today for a routine annual exam.  Current complaints:  Night sweats associated with perimenopause. Is on gabapentin  for this and has good improvement.  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  She does have a history of cervical dysplasia.  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    Last pap 2020. Results were: NILM w/ HRHPV negative. H/O abnormal pap: yes Last mammogram: normal Last colonoscopy: pending     06/18/2024    2:43 PM 02/27/2024    1:05 PM 02/28/2023    1:24 PM 04/12/2022    1:04 PM 12/14/2021    2:24 PM  Depression screen PHQ 2/9  Decreased Interest 2 0 0 0 0  Down, Depressed, Hopeless 0 0 0 0 0  PHQ - 2 Score 2 0 0 0 0  Altered sleeping 3 0 0  0  Tired, decreased energy 0 0 0  0  Change in appetite 3 0 0    Feeling bad or failure about yourself  0 0 0    Trouble concentrating 3 0 0    Moving slowly or fidgety/restless 2 0 0    Suicidal thoughts 0 0 0    PHQ-9 Score 13 0 0  0  Difficult doing work/chores Very difficult Not difficult at all Not difficult at all          06/18/2024    2:44 PM 10/30/2021    5:13 PM  GAD 7 : Generalized Anxiety Score  Nervous, Anxious, on Edge 2 2  Control/stop worrying 2 1  Worry too much - different things 0 2  Trouble relaxing 0 3  Restless 0 3  Easily annoyed or irritable 2 3  Afraid - awful might happen 0 0  Total GAD 7 Score 6 14  Anxiety Difficulty Not difficult at all      Review of Systems:   Pertinent items are noted in HPI Denies any headaches, blurred vision, fatigue, shortness  of breath, chest pain, abdominal pain, abnormal vaginal discharge/itching/odor/irritation, problems with periods, bowel movements, urination, or intercourse unless otherwise stated above. Pertinent History Reviewed:  Reviewed past medical,surgical, social and family history.  Reviewed problem list, medications and allergies. Physical Assessment:   Vitals:   08/05/24 1457  BP: 112/76  Pulse: 99  Weight: 179 lb (81.2 kg)  Height: 5' 4 (1.626 m)  Body mass index is 30.73 kg/m.        Physical Examination:   General appearance - well appearing, and in no distress  Mental status - alert, oriented to person, place, and time  Psych:  She has a normal mood and affect  Skin - warm and dry, normal color, no suspicious lesions noted  Chest - effort normal, all lung fields clear to auscultation bilaterally  Heart - normal rate and regular rhythm  Neck:  midline trachea, no thyromegaly or nodules  Breasts - breasts appear normal, no suspicious masses, no skin or nipple changes or axillary nodes  Abdomen - soft, nontender, nondistended, no masses or organomegaly  Pelvic - VULVA:  normal appearing vulva with no masses, tenderness or lesions  VAGINA: normal appearing vagina with normal color and discharge, no lesions  CERVIX: normal appearing cervix without discharge or lesions, no CMT  Thin prep pap is done with HR HPV cotesting  UTERUS: uterus is felt to be normal size, shape, consistency and nontender   ADNEXA: No adnexal masses or tenderness noted.  Extremities:  No swelling or varicosities noted  Chaperone present for exam  Assessment & Plan:  1. Well woman exam with routine gynecological exam (Primary)  2. Perimenopause Controlled on gabapentin   3. History of cervical dysplasia Check Pap today and treat with results.  4. Type 2 diabetes mellitus without complication, without long-term current use of insulin (HCC)    Labs/procedures today:   No orders of the defined types were  placed in this encounter.   Meds: No orders of the defined types were placed in this encounter.   Follow-up: No follow-ups on file.  Jules Vidovich J Nayan Proch, DO 08/05/2024 3:28 PM

## 2024-08-05 NOTE — Addendum Note (Signed)
 Addended by: VENUS AMERICA A on: 08/05/2024 04:35 PM   Modules accepted: Orders

## 2024-08-10 LAB — CYTOLOGY - PAP
Chlamydia: NEGATIVE
Comment: NEGATIVE
Comment: NEGATIVE
Comment: NORMAL
Diagnosis: NEGATIVE
Diagnosis: REACTIVE
High risk HPV: NEGATIVE
Neisseria Gonorrhea: NEGATIVE

## 2024-08-11 ENCOUNTER — Ambulatory Visit: Payer: Self-pay | Admitting: Family Medicine

## 2024-08-19 ENCOUNTER — Other Ambulatory Visit (HOSPITAL_BASED_OUTPATIENT_CLINIC_OR_DEPARTMENT_OTHER): Payer: Self-pay

## 2024-08-30 ENCOUNTER — Encounter: Payer: Self-pay | Admitting: Family

## 2024-08-30 ENCOUNTER — Other Ambulatory Visit (HOSPITAL_BASED_OUTPATIENT_CLINIC_OR_DEPARTMENT_OTHER): Payer: Self-pay

## 2024-08-30 ENCOUNTER — Other Ambulatory Visit: Payer: Self-pay

## 2024-08-30 ENCOUNTER — Other Ambulatory Visit: Payer: Self-pay | Admitting: Family

## 2024-08-30 DIAGNOSIS — F909 Attention-deficit hyperactivity disorder, unspecified type: Secondary | ICD-10-CM

## 2024-08-30 DIAGNOSIS — J309 Allergic rhinitis, unspecified: Secondary | ICD-10-CM

## 2024-08-30 DIAGNOSIS — R232 Flushing: Secondary | ICD-10-CM

## 2024-08-30 MED ORDER — IBUPROFEN 800 MG PO TABS
800.0000 mg | ORAL_TABLET | Freq: Three times a day (TID) | ORAL | 0 refills | Status: DC | PRN
  Filled 2024-08-30: qty 30, 10d supply, fill #0

## 2024-08-30 MED ORDER — FLUTICASONE PROPIONATE 50 MCG/ACT NA SUSP
1.0000 | Freq: Two times a day (BID) | NASAL | 3 refills | Status: AC
  Filled 2024-08-30: qty 16, 30d supply, fill #0

## 2024-08-30 MED ORDER — GABAPENTIN 300 MG PO CAPS
300.0000 mg | ORAL_CAPSULE | Freq: Every day | ORAL | 1 refills | Status: AC
  Filled 2024-08-30: qty 90, 90d supply, fill #0
  Filled 2024-12-01: qty 90, 90d supply, fill #1

## 2024-09-02 ENCOUNTER — Other Ambulatory Visit (HOSPITAL_BASED_OUTPATIENT_CLINIC_OR_DEPARTMENT_OTHER): Payer: Self-pay

## 2024-09-02 MED ORDER — AMPHETAMINE-DEXTROAMPHET ER 20 MG PO CP24
40.0000 mg | ORAL_CAPSULE | Freq: Every day | ORAL | 0 refills | Status: DC
  Filled 2024-09-02: qty 13, 7d supply, fill #0
  Filled 2024-09-02: qty 47, 23d supply, fill #0

## 2024-09-02 NOTE — Telephone Encounter (Signed)
 Requesting: Adderall XR 20mg   Contract:02/27/24 UDS:  02/27/24 Last Visit: 07/02/24 Next Visit: 10/01/24 Last Refill: 08/02/24 #60 and 0RF  Please Advise

## 2024-09-02 NOTE — Addendum Note (Signed)
 Addended by: Malayasia Mirkin D on: 09/02/2024 11:45 AM   Modules accepted: Orders

## 2024-09-02 NOTE — Addendum Note (Signed)
 Addended by: DARYL SETTER on: 09/02/2024 11:48 AM   Modules accepted: Orders

## 2024-09-16 ENCOUNTER — Encounter: Payer: Self-pay | Admitting: Internal Medicine

## 2024-09-30 ENCOUNTER — Other Ambulatory Visit (HOSPITAL_BASED_OUTPATIENT_CLINIC_OR_DEPARTMENT_OTHER): Payer: Self-pay

## 2024-09-30 ENCOUNTER — Other Ambulatory Visit: Payer: Self-pay

## 2024-09-30 ENCOUNTER — Encounter (HOSPITAL_BASED_OUTPATIENT_CLINIC_OR_DEPARTMENT_OTHER): Payer: Self-pay

## 2024-09-30 ENCOUNTER — Encounter: Payer: Self-pay | Admitting: Family

## 2024-09-30 ENCOUNTER — Other Ambulatory Visit: Payer: Self-pay | Admitting: Family

## 2024-09-30 DIAGNOSIS — F909 Attention-deficit hyperactivity disorder, unspecified type: Secondary | ICD-10-CM

## 2024-09-30 MED ORDER — AMPHETAMINE-DEXTROAMPHET ER 20 MG PO CP24
40.0000 mg | ORAL_CAPSULE | Freq: Every day | ORAL | 0 refills | Status: DC
  Filled 2024-09-30: qty 60, 30d supply, fill #0

## 2024-09-30 NOTE — Telephone Encounter (Signed)
 Requesting: Adderall XR 20mg   Contract:02/27/24 UDS:02/27/24 Last Visit: 07/02/24 Next Visit:10/08/24 Last Refill: 09/02/24 #60 and 0RF   Please Advise

## 2024-10-01 ENCOUNTER — Ambulatory Visit: Admitting: Family

## 2024-10-01 ENCOUNTER — Other Ambulatory Visit (HOSPITAL_BASED_OUTPATIENT_CLINIC_OR_DEPARTMENT_OTHER): Payer: Self-pay

## 2024-10-03 MED ORDER — AMPHETAMINE-DEXTROAMPHET ER 20 MG PO CP24: 40.0000 mg | ORAL_CAPSULE | Freq: Every day | ORAL | 0 refills | Status: DC

## 2024-10-04 ENCOUNTER — Other Ambulatory Visit (HOSPITAL_BASED_OUTPATIENT_CLINIC_OR_DEPARTMENT_OTHER): Payer: Self-pay

## 2024-10-08 ENCOUNTER — Telehealth: Payer: Self-pay | Admitting: Family

## 2024-10-08 ENCOUNTER — Ambulatory Visit: Admitting: Family

## 2024-10-08 ENCOUNTER — Other Ambulatory Visit (HOSPITAL_BASED_OUTPATIENT_CLINIC_OR_DEPARTMENT_OTHER): Payer: Self-pay

## 2024-10-08 VITALS — BP 121/71 | HR 89 | Temp 99.0°F | Ht 64.0 in | Wt 182.0 lb

## 2024-10-08 DIAGNOSIS — E119 Type 2 diabetes mellitus without complications: Secondary | ICD-10-CM

## 2024-10-08 DIAGNOSIS — R232 Flushing: Secondary | ICD-10-CM

## 2024-10-08 DIAGNOSIS — F909 Attention-deficit hyperactivity disorder, unspecified type: Secondary | ICD-10-CM

## 2024-10-08 DIAGNOSIS — K219 Gastro-esophageal reflux disease without esophagitis: Secondary | ICD-10-CM

## 2024-10-08 DIAGNOSIS — J45909 Unspecified asthma, uncomplicated: Secondary | ICD-10-CM | POA: Diagnosis not present

## 2024-10-08 DIAGNOSIS — E538 Deficiency of other specified B group vitamins: Secondary | ICD-10-CM

## 2024-10-08 DIAGNOSIS — F419 Anxiety disorder, unspecified: Secondary | ICD-10-CM

## 2024-10-08 MED ORDER — ALBUTEROL SULFATE HFA 108 (90 BASE) MCG/ACT IN AERS
2.0000 | INHALATION_SPRAY | Freq: Four times a day (QID) | RESPIRATORY_TRACT | 0 refills | Status: AC | PRN
  Filled 2024-10-08: qty 6.7, 19d supply, fill #0

## 2024-10-08 MED ORDER — AMPHETAMINE-DEXTROAMPHET ER 20 MG PO CP24
40.0000 mg | ORAL_CAPSULE | Freq: Every day | ORAL | 0 refills | Status: DC
  Filled 2024-10-08: qty 60, 30d supply, fill #0

## 2024-10-08 NOTE — Progress Notes (Signed)
 Subjective:     Patient ID: Barbara Barr, female    DOB: [birth year], 48 y.o.   MRN: [id number]  Chief Complaint  Patient presents with   Follow-up    Follow up for Mounjaro     HPI  Discussed the use of AI scribe software for clinical note transcription with the patient, who gave verbal consent to proceed.  History of Present Illness Barbara Barr [name] is a 48 year old female with diabetes and ADHD who presents for a follow-up visit.  She is managing her diabetes with Mounjaro , which she finds effective, though she experiences bloating or gas, particularly on the second day after administration. She has had episodes of hypoglycemia with readings as low as 59 mg/dL and occasional hyperglycemia, including one instance over 200 mg/dL without dietary cause. She uses a Freestyle monitor, which she finds uncomfortable due to its arm placement.  She has been unable to fill her Adderall prescription due to availability issues, leading to increased anxiety and irritability. She reports increased anxiety and irritability when she is unable to take Adderall. She takes Buspar  for anxiety. She is experiencing additional stress from work and family responsibilities, including the birth of her grandchildren.  She has a history of asthma and allergies, which exacerbate in extreme temperatures. She uses Flonase  and requires a new albuterol  inhaler as her current one is outdated. Allergy flares cause nasal discomfort, affecting her ability to wear glasses.  She takes Pepcid  twice daily for heartburn associated with Mounjaro  and has reduced gabapentin  use as her hot flashes have subsided. She is nearing a year without menstruation, with her last cycle in February.  Her B12 levels are low normal, and she continues B12 supplementation. She has an eye exam scheduled for December 18th and is arranging a gastroenterology appointment after a delayed referral.      Health Maintenance Due  Topic Date Due    FOOT EXAM  Never done   Diabetic kidney evaluation - Urine ACR  Never done   Pneumococcal Vaccine (1 of 2 - PCV) Never done   Hepatitis B Vaccines 19-59 Average Risk (1 of 3 - 19+ 3-dose series) Never done   Colonoscopy  Never done   OPHTHALMOLOGY EXAM  11/06/2023   Influenza Vaccine  05/28/2024    Past Medical History:  Diagnosis Date   ADHD (attention deficit hyperactivity disorder)    Allergic rhinitis, seasonal    Anemia    Anxiety    B12 deficiency 10/31/2021   Cervical dysplasia    high grade   Environmental allergies    Headache    History of gestational diabetes    History of tobacco abuse 04/07/2018   Obsessive-compulsive disorder    PONV (postoperative nausea and vomiting)    Pre-diabetes    Wears glasses     Past Surgical History:  Procedure Laterality Date   COLPOSCOPY N/A 05/12/2018   Procedure: COLPOSCOPY;  Surgeon: Cathlyn JAYSON Nikki Bobie FORBES, MD;  Location: [facility];  Service: Gynecology;  Laterality: N/A;   LAPAROSCOPIC CHOLECYSTECTOMY  2003   LEEP N/A 05/12/2018   Procedure: LOOP ELECTROSURGICAL EXCISION PROCEDURE (LEEP) with ECC;  Surgeon: Cathlyn JAYSON Nikki Bobie FORBES, MD;  Location: [facility];  Service: Gynecology;  Laterality: N/A;  with ECC   LEEP     2021   LEEP  2006   RHINOPLASTY  02/2018   TUBAL LIGATION Bilateral 2006    Family History  Adopted: Yes  Problem Relation Age of  Onset   Drug abuse Mother        was a prostitute   Bipolar disorder Mother    Bipolar disorder Son    Dementia Maternal Aunt    Cancer Maternal Aunt    Cervical cancer Maternal Aunt    Cancer Maternal Aunt    ADD / ADHD Maternal Uncle    Stroke Maternal Uncle    Heart attack Maternal Uncle     Social History   Socioeconomic History   Marital status: Media Planner    Spouse name: Not on file   Number of children: Not on file   Years of education: Not on file   Highest education level: Bachelor's degree (e.g., BA, AB,  BS)  Occupational History   Not on file  Tobacco Use   Smoking status: Former    Current packs/day: 0.50    Average packs/day: 0.5 packs/day for 25.0 years (12.5 ttl pk-yrs)    Types: Cigarettes    Start date: 01/26/2021   Smokeless tobacco: Never  Vaping Use   Vaping status: Every Day  Substance and Sexual Activity   Alcohol use: No   Drug use: No   Sexual activity: Yes    Birth control/protection: Surgical  Other Topics Concern   Not on file  Social History Narrative   Adopted by her great aunt on her mother's side   Has a half sister with her biological mom   Works as an engineer, civil (consulting)   Lives with her boyfriend   2003- son Camellia   2006- Daughter [name]   Enjoys building furniture/arts and crafts/fishing   Completed bachelor's degree   2 [employer]   Social Drivers of Health   Tobacco Use: Medium Risk (03/11/2024)   Patient History    Smoking Tobacco Use: Former    Smokeless Tobacco Use: Never    Passive Exposure: Not on Actuary Strain: Low Risk (02/27/2024)   Overall Financial Resource Strain (CARDIA)    Difficulty of Paying Living Expenses: Not hard at all  Food Insecurity: No Food Insecurity (02/27/2024)   Hunger Vital Sign    Worried About Running Out of Food in the Last Year: Never true    Ran Out of Food in the Last Year: Never true  Transportation Needs: No Transportation Needs (02/27/2024)   PRAPARE - Administrator, Civil Service (Medical): No    Lack of Transportation (Non-Medical): No  Physical Activity: Sufficiently Active (02/27/2024)   Exercise Vital Sign    Days of Exercise per Week: 5 days    Minutes of Exercise per Session: 50 min  Stress: Stress Concern Present (02/27/2024)   [employer] of Occupational Health - Occupational Stress Questionnaire    Feeling of Stress : To some extent  Social Connections: Socially Isolated (02/27/2024)   Social Connection and Isolation Panel    Frequency of Communication  with Friends and Family: More than three times a week    Frequency of Social Gatherings with Friends and Family: Twice a week    Attends Religious Services: Never    Database Administrator or Organizations: No    Attends Engineer, Structural: Not on file    Marital Status: Divorced  Intimate Partner Violence: Unknown (01/29/2022)   Received from [facility]   HITS    Physically Hurt: Not on file    Insult or Talk Down To: Not on file    Threaten Physical Harm: Not on file  Scream or Curse: Not on file  Depression (PHQ2-9): Medium Risk (10/08/2024)   Depression (PHQ2-9)    PHQ-2 Score: 5  Alcohol Screen: Low Risk (02/27/2024)   Alcohol Screen    Last Alcohol Screening Score (AUDIT): 0  Housing: Low Risk (02/27/2024)   Housing Stability Vital Sign    Unable to Pay for Housing in the Last Year: No    Number of Times Moved in the Last Year: 1    Homeless in the Last Year: No  Utilities: Not on file  Health Literacy: Not on file    Outpatient Medications Prior to Visit  Medication Sig Dispense Refill   amitriptyline  (ELAVIL ) 10 MG tablet Take 1 tablet (10 mg total) by mouth at bedtime. 90 tablet 0   busPIRone  (BUSPAR ) 7.5 MG tablet Take 1 tablet (7.5 mg total) by mouth 2 (two) times daily. 180 tablet 0   Cholecalciferol (VITAMIN D3) 75 MCG (3000 UT) TABS Take 1 tablet by mouth daily. 30 tablet    Continuous Glucose Sensor (FREESTYLE LIBRE 3 PLUS SENSOR) MISC Change sensor every 15 days. 6 each 5   Continuous Glucose Sensor (FREESTYLE LIBRE 3 PLUS SENSOR) MISC Change sensor every 15 days 6 each 5   famotidine  (PEPCID ) 20 MG tablet Take 1 tablet (20 mg total) by mouth 2 (two) times daily. 180 tablet 1   fluticasone  (FLONASE ) 50 MCG/ACT nasal spray Place 1 spray into both nostrils 2 (two) times daily. 16 g 3   folic acid  (FOLVITE ) 1 MG tablet Take 1 tablet (1 mg total) by mouth daily.     gabapentin  (NEURONTIN ) 300 MG capsule Take 1 capsule (300 mg total) by mouth at bedtime. 90  capsule 1   ibuprofen  (ADVIL ) 800 MG tablet Take 1 tablet (800 mg total) by mouth every 8 (eight) hours as needed. 30 tablet 0   L-Tyrosine 1000 MG TABS Take by mouth.     Magnesium Stearate POWD Take 1,000 mg by mouth daily.     ondansetron  (ZOFRAN ) 4 MG tablet Take 1 tablet (4 mg total) by mouth every 8 (eight) hours as needed for nausea or vomiting. 20 tablet 0   Plecanatide  (TRULANCE ) 3 MG TABS Take 1 tablet (3 mg total) by mouth daily. 90 tablet 1   Plecanatide  (TRULANCE ) 3 MG TABS Take 1 tablet (3 mg total) by mouth daily. 30 tablet 2   SUMAtriptan  (IMITREX ) 100 MG tablet Take 1 tablet as needed.  May repeat after 2 hours.  Maximum 2 tablets in 24 hours. 10 tablet 5   tirzepatide  (MOUNJARO ) 2.5 MG/0.5ML Pen Inject 2.5 mg into the skin once a week. 2 mL 1   tirzepatide  (MOUNJARO ) 2.5 MG/0.5ML Pen Inject 2.5 mg into the skin once a week. 2 mL 1   topiramate  (TOPAMAX ) 25 MG tablet Take 1 tablet (25 mg total) by mouth at bedtime. 90 tablet 1   vitamin B-12 (CYANOCOBALAMIN ) 100 MCG tablet Take 100 mcg by mouth daily.     amphetamine -dextroamphetamine  (ADDERALL XR) 20 MG 24 hr capsule Take 2 capsules (40 mg total) by mouth daily. 60 capsule 0   No facility-administered medications prior to visit.    Allergies[1]  ROS See HPI    Objective:    Physical Exam Constitutional:      General: She is not in acute distress.    Appearance: Normal appearance. She is well-developed.  HENT:     Head: Normocephalic and atraumatic.     Right Ear: External ear normal.  Left Ear: External ear normal.  Eyes:     General: No scleral icterus. Neck:     Thyroid: No thyromegaly.  Cardiovascular:     Rate and Rhythm: Normal rate and regular rhythm.     Heart sounds: Normal heart sounds. No murmur heard. Pulmonary:     Effort: Pulmonary effort is normal. No respiratory distress.     Breath sounds: Normal breath sounds. No wheezing.  Musculoskeletal:     Cervical back: Neck supple.  Skin:     General: Skin is warm and dry.  Neurological:     Mental Status: She is alert and oriented to person, place, and time.  Psychiatric:        Mood and Affect: Mood normal.        Behavior: Behavior normal.        Thought Content: Thought content normal.        Judgment: Judgment normal.      BP 121/71 (BP Location: Left Arm, Patient Position: Sitting, Cuff Size: Normal)   Pulse 89   Temp 99 F (37.2 C) (Oral)   Ht 5' 4 (1.626 m)   Wt 182 lb (82.6 kg)   SpO2 100%   BMI 31.24 kg/m  Wt Readings from Last 3 Encounters:  10/08/24 182 lb (82.6 kg)  08/05/24 179 lb (81.2 kg)  07/02/24 187 lb (84.8 kg)       Assessment & Plan:   Problem List Items Addressed This Visit       Unprioritized   Type 2 diabetes mellitus without complication, without long-term current use of insulin (HCC) - Primary   Lab Results  Component Value Date   HGBA1C 6.5 (H) 06/18/2024   HGBA1C 6.3 (H) 02/27/2024   HGBA1C 6.3 10/17/2023   Lab Results  Component Value Date   LDLCALC 98 02/28/2023   CREATININE 0.74 06/18/2024   Managed with Tirzepatide .  Reports bloating and gas- mild at this time. Advised these are typical side effects. Blood glucose levels variable, with a few episodes of hypoglycemia.  - Ensure adequate nutrition to prevent hypoglycemia. - Continue Tirzepatide  2.5 MG/0.5ML subcutaneous weekly. - Order A1c test.      Relevant Orders   HgB A1c   Urine Microalbumin w/creat. ratio   Basic Metabolic Panel (BMET)   Lipid panel   Hot flashes   Stable without gabapentin . OK to remain off of gabapentin .       Gastroesophageal reflux disease   Stable on pepcid , continue same.       B12 deficiency   Last b12 was WNL, continue oral supplement.       Attention deficit hyperactivity disorder (ADHD)   Stable on adderall xr, continue same.       Relevant Medications   amphetamine -dextroamphetamine  (ADDERALL XR) 20 MG 24 hr capsule   Asthma   Stable unless very cold out- refill  albuterol .       Relevant Medications   albuterol  (VENTOLIN  HFA) 108 (90 Base) MCG/ACT inhaler   Anxiety   Has a lot going on, continue current dose of buspar  for now.       Assessment & Plan     I am having Reise Gladney A. Frayne start on albuterol . I am also having her maintain her Vitamin D3, folic acid , Magnesium Stearate (Bulk), L-Tyrosine, ondansetron , busPIRone , Trulance , vitamin B-12, SUMAtriptan , topiramate , amitriptyline , Mounjaro , famotidine , Mounjaro , FreeStyle Libre 3 Plus Sensor, FreeStyle Libre 3 Plus Sensor, Trulance , fluticasone , ibuprofen , gabapentin , and amphetamine -dextroamphetamine .  Meds ordered this encounter  Medications  amphetamine -dextroamphetamine  (ADDERALL XR) 20 MG 24 hr capsule    Sig: Take 2 capsules (40 mg total) by mouth daily.    Dispense:  60 capsule    Refill:  0    Supervising Provider:   DOMENICA BLACKBIRD A [[id number]]   albuterol  (VENTOLIN  HFA) 108 (90 Base) MCG/ACT inhaler    Sig: Inhale 2 puffs into the lungs every 6 (six) hours as needed for wheezing or shortness of breath.    Dispense:  8 g    Refill:  0    Supervising Provider:   DOMENICA BLACKBIRD A [[id number]]      [1]  Allergies Allergen Reactions   Lexapro [Escitalopram] Hives   Penicillins Hives   Benadryl [Diphenhydramine] Rash   Codeine Itching   Prilosec [Omeprazole ]     Severe diarrhea

## 2024-10-08 NOTE — Assessment & Plan Note (Addendum)
 Stable on adderall xr, continue same.  Resent rx downstairs as her pharmacy is out of stock.

## 2024-10-08 NOTE — Patient Instructions (Signed)
°  VISIT SUMMARY: During your follow-up visit, we discussed the management of your diabetes, ADHD, asthma, vitamin B12 levels, anxiety, and heartburn. We reviewed your current medications and addressed some of the side effects and challenges you are experiencing. We also discussed your upcoming eye exam and the need to arrange a gastroenterology appointment.  YOUR PLAN: -TYPE 2 DIABETES MELLITUS: Type 2 diabetes is a condition where your body does not use insulin properly, leading to high blood sugar levels. You will continue taking Tirzepatide  (Mounjaro ) 2.5 MG/0.5ML subcutaneously once a week. We will order an A1c test to monitor your blood sugar control. Ensure you are eating adequately to prevent low blood sugar episodes. We also discussed potential insurance issues with Mounjaro .  -ATTENTION-DEFICIT HYPERACTIVITY DISORDER (ADHD): ADHD is a condition that affects your ability to focus and control impulses. You have had difficulty obtaining your Adderall XR, which has increased your anxiety and irritability. We will send a prescription for Adderall XR to the pharmacy and advise you to resume taking it.  -ASTHMA: Asthma is a condition that causes your airways to narrow and swell, making it difficult to breathe. Your asthma is exacerbated by extreme temperatures. We will send a prescription for a new albuterol  inhaler to the pharmacy as your current one may be outdated.  -VITAMIN B12 DEFICIENCY: Vitamin B12 deficiency can cause fatigue and weakness. Your levels are low normal but within range. Continue taking Vitamin B12 100 MCG orally every day.  -ANXIETY DISORDER: Anxiety disorder is a condition that causes excessive worry and stress. Your anxiety has increased due to stressors and the lack of Adderall. We will reassess your anxiety management after you resume taking Adderall XR.  -GASTROESOPHAGEAL REFLUX DISEASE (GERD): GERD is a condition where stomach acid frequently flows back into the tube  connecting your mouth and stomach, causing heartburn. You will continue taking Famotidine  (Pepcid ) 20 MG orally as needed to manage your heartburn, especially with Tirzepatide  use.  INSTRUCTIONS: Please follow up with the A1c test as ordered. Resume taking Adderall XR as prescribed. Pick up your new albuterol  inhaler from the pharmacy. Continue taking your current medications as directed. Ensure you are eating adequately to prevent low blood sugar episodes. Attend your scheduled eye exam on December 18th and arrange your gastroenterology appointment as soon as possible.

## 2024-10-08 NOTE — Telephone Encounter (Signed)
 Please cancel [facility] at [facility]. It was resent [facility].

## 2024-10-08 NOTE — Assessment & Plan Note (Signed)
Stable on pepcid, continue same.

## 2024-10-08 NOTE — Assessment & Plan Note (Signed)
 Last b12 was WNL, continue oral supplement.

## 2024-10-08 NOTE — Assessment & Plan Note (Signed)
 Has a lot going on, continue current dose of buspar  for now.

## 2024-10-08 NOTE — Assessment & Plan Note (Addendum)
 Lab Results  Component Value Date   HGBA1C 6.5 (H) 06/18/2024   HGBA1C 6.3 (H) 02/27/2024   HGBA1C 6.3 10/17/2023   Lab Results  Component Value Date   LDLCALC 98 02/28/2023   CREATININE 0.74 06/18/2024   Managed with Tirzepatide .  Reports bloating and gas- mild at this time. Advised these are typical side effects. Blood glucose levels variable, with a few episodes of hypoglycemia.  - Ensure adequate nutrition to prevent hypoglycemia. - Continue Tirzepatide  2.5 MG/0.5ML subcutaneous weekly. - Order A1c test.

## 2024-10-08 NOTE — Assessment & Plan Note (Signed)
 Stable unless very cold out- refill albuterol .

## 2024-10-08 NOTE — Assessment & Plan Note (Signed)
 Stable without gabapentin . OK to remain off of gabapentin .

## 2024-10-11 NOTE — Telephone Encounter (Signed)
Rx cancelled at [facility]

## 2024-10-12 NOTE — Addendum Note (Signed)
 Addended by: ESTELLE GILLIS D on: 10/12/2024 09:41 AM   Modules accepted: Orders

## 2024-10-15 ENCOUNTER — Other Ambulatory Visit

## 2024-10-18 ENCOUNTER — Telehealth: Payer: Self-pay | Admitting: Family

## 2024-10-18 NOTE — Telephone Encounter (Addendum)
-----   Message from Eleanor Ponto, NP sent at 10/08/2024  4:46 PM EST ----- Please call for eye exam from [facility] in [place]

## 2024-10-18 NOTE — Telephone Encounter (Signed)
 Electronic request sent

## 2024-10-29 ENCOUNTER — Telehealth: Payer: Self-pay

## 2024-10-29 ENCOUNTER — Other Ambulatory Visit (HOSPITAL_COMMUNITY): Payer: Self-pay

## 2024-10-29 NOTE — Telephone Encounter (Signed)
 Pharmacy Patient Advocate Encounter   Received notification from Onbase that prior authorization for Mounjaro  2.5 is required/requested.   Insurance verification completed.   The patient is insured through [facility].   Per test claim: The current 28 day co-pay is, $25.00.  No PA needed at this time. This test claim was processed through [facility]- copay amounts may vary at other pharmacies due to pharmacy/plan contracts, or as the patient moves through the different stages of their insurance plan.

## 2024-12-01 ENCOUNTER — Other Ambulatory Visit: Payer: Self-pay | Admitting: Family

## 2024-12-01 ENCOUNTER — Other Ambulatory Visit: Payer: Self-pay

## 2024-12-01 DIAGNOSIS — F909 Attention-deficit hyperactivity disorder, unspecified type: Secondary | ICD-10-CM

## 2024-12-01 MED ORDER — TIRZEPATIDE 5 MG/0.5ML ~~LOC~~ SOAJ: 5.0000 mg | SUBCUTANEOUS | 1 refills | Status: DC

## 2024-12-01 MED ORDER — AMPHETAMINE-DEXTROAMPHET ER 20 MG PO CP24: 40.0000 mg | ORAL_CAPSULE | Freq: Every day | ORAL | 0 refills | Status: DC

## 2024-12-01 NOTE — Telephone Encounter (Signed)
 Requesting: Adderall XR 20mg   Contract: 02/27/24 UDS: 02/27/24 Last Visit: 10/08/24 Next Visit: None Last Refill: 10/08/24 #60 and 0RF   Please Advise

## 2024-12-02 ENCOUNTER — Other Ambulatory Visit (HOSPITAL_BASED_OUTPATIENT_CLINIC_OR_DEPARTMENT_OTHER): Payer: Self-pay

## 2024-12-02 ENCOUNTER — Other Ambulatory Visit: Payer: Self-pay

## 2024-12-02 ENCOUNTER — Encounter: Payer: Self-pay | Admitting: Family

## 2024-12-02 ENCOUNTER — Other Ambulatory Visit: Payer: Self-pay | Admitting: Family

## 2024-12-02 DIAGNOSIS — F909 Attention-deficit hyperactivity disorder, unspecified type: Secondary | ICD-10-CM

## 2024-12-02 MED ORDER — IBUPROFEN 800 MG PO TABS
800.0000 mg | ORAL_TABLET | Freq: Three times a day (TID) | ORAL | 0 refills | Status: AC | PRN
  Filled 2024-12-02: qty 30, 10d supply, fill #0

## 2024-12-02 MED ORDER — AMPHETAMINE-DEXTROAMPHET ER 20 MG PO CP24
40.0000 mg | ORAL_CAPSULE | Freq: Every day | ORAL | 0 refills | Status: DC
  Filled 2024-12-02: qty 60, 30d supply, fill #0

## 2024-12-02 MED ORDER — TIRZEPATIDE 5 MG/0.5ML ~~LOC~~ SOAJ
5.0000 mg | SUBCUTANEOUS | 1 refills | Status: DC
  Filled 2024-12-02: qty 6, 84d supply, fill #0

## 2024-12-02 MED ORDER — AMPHETAMINE-DEXTROAMPHET ER 20 MG PO CP24
40.0000 mg | ORAL_CAPSULE | Freq: Every day | ORAL | 0 refills | Status: AC
  Filled 2024-12-02: qty 60, 30d supply, fill #0

## 2024-12-02 MED ORDER — TIRZEPATIDE 5 MG/0.5ML ~~LOC~~ SOAJ
5.0000 mg | SUBCUTANEOUS | 1 refills | Status: AC
  Filled 2024-12-02: qty 2, 28d supply, fill #0

## 2024-12-02 NOTE — Telephone Encounter (Signed)
 [facility]- cancelled rx's for mounjaro  and adderall

## 2025-03-11 ENCOUNTER — Ambulatory Visit: Admitting: Neurology
